# Patient Record
Sex: Male | Born: 1935 | Race: White | Hispanic: No | Marital: Married | Smoking: Former smoker
Health system: Southern US, Community
[De-identification: ages and names within clinical notes are randomized; demographics above are authoritative.]

## PROBLEM LIST (undated history)

## (undated) DIAGNOSIS — E7800 Pure hypercholesterolemia, unspecified: Secondary | ICD-10-CM

## (undated) DIAGNOSIS — S069X9A Unspecified intracranial injury with loss of consciousness of unspecified duration, initial encounter: Secondary | ICD-10-CM

## (undated) DIAGNOSIS — R220 Localized swelling, mass and lump, head: Secondary | ICD-10-CM

## (undated) DIAGNOSIS — R258 Other abnormal involuntary movements: Secondary | ICD-10-CM

## (undated) DIAGNOSIS — M1990 Unspecified osteoarthritis, unspecified site: Secondary | ICD-10-CM

## (undated) DIAGNOSIS — R531 Weakness: Secondary | ICD-10-CM

## (undated) DIAGNOSIS — G8190 Hemiplegia, unspecified affecting unspecified side: Secondary | ICD-10-CM

## (undated) DIAGNOSIS — Z8719 Personal history of other diseases of the digestive system: Secondary | ICD-10-CM

## (undated) DIAGNOSIS — F0391 Unspecified dementia with behavioral disturbance: Secondary | ICD-10-CM

## (undated) DIAGNOSIS — R42 Dizziness and giddiness: Secondary | ICD-10-CM

## (undated) DIAGNOSIS — G250 Essential tremor: Secondary | ICD-10-CM

## (undated) DIAGNOSIS — G8929 Other chronic pain: Secondary | ICD-10-CM

## (undated) DIAGNOSIS — K819 Cholecystitis, unspecified: Secondary | ICD-10-CM

## (undated) DIAGNOSIS — I639 Cerebral infarction, unspecified: Secondary | ICD-10-CM

## (undated) DIAGNOSIS — I10 Essential (primary) hypertension: Secondary | ICD-10-CM

## (undated) DIAGNOSIS — F0390 Unspecified dementia without behavioral disturbance: Secondary | ICD-10-CM

## (undated) HISTORY — DX: Weakness: R53.1

## (undated) HISTORY — DX: Pure hypercholesterolemia, unspecified: E78.00

## (undated) HISTORY — DX: Other chronic pain: G89.29

## (undated) HISTORY — PX: ESOPHAGOGASTRODUODENOSCOPY (EGD) WITH ESOPHAGEAL DILATION: SHX5812

## (undated) HISTORY — DX: Localized swelling, mass and lump, head: R22.0

## (undated) HISTORY — DX: Unspecified dementia, unspecified severity, without behavioral disturbance, psychotic disturbance, mood disturbance, and anxiety: F03.90

## (undated) HISTORY — PX: JOINT REPLACEMENT: SHX530

## (undated) HISTORY — DX: Unspecified dementia with behavioral disturbance: F03.91

## (undated) HISTORY — PX: BACK SURGERY: SHX140

## (undated) HISTORY — PX: TOTAL KNEE ARTHROPLASTY: SHX125

## (undated) HISTORY — DX: Unspecified intracranial injury with loss of consciousness of unspecified duration, initial encounter: S06.9X9A

---

## 1970-09-07 DIAGNOSIS — S069X9A Unspecified intracranial injury with loss of consciousness of unspecified duration, initial encounter: Secondary | ICD-10-CM

## 1970-09-07 DIAGNOSIS — S069XAA Unspecified intracranial injury with loss of consciousness status unknown, initial encounter: Secondary | ICD-10-CM

## 1970-09-07 DIAGNOSIS — G8190 Hemiplegia, unspecified affecting unspecified side: Secondary | ICD-10-CM

## 1970-09-07 HISTORY — DX: Hemiplegia, unspecified affecting unspecified side: G81.90

## 1970-09-07 HISTORY — DX: Unspecified intracranial injury with loss of consciousness status unknown, initial encounter: S06.9XAA

## 1970-09-07 HISTORY — DX: Unspecified intracranial injury with loss of consciousness of unspecified duration, initial encounter: S06.9X9A

## 1983-05-12 HISTORY — PX: INGUINAL HERNIA REPAIR: SUR1180

## 1994-05-11 HISTORY — PX: COLON RESECTION: SHX5231

## 1999-01-10 ENCOUNTER — Emergency Department (HOSPITAL_COMMUNITY): Admission: EM | Admit: 1999-01-10 | Discharge: 1999-01-10 | Payer: Self-pay | Admitting: *Deleted

## 1999-09-14 ENCOUNTER — Emergency Department (HOSPITAL_COMMUNITY): Admission: EM | Admit: 1999-09-14 | Discharge: 1999-09-14 | Payer: Self-pay | Admitting: Emergency Medicine

## 1999-09-14 ENCOUNTER — Encounter: Payer: Self-pay | Admitting: Emergency Medicine

## 1999-11-03 ENCOUNTER — Encounter: Admission: RE | Admit: 1999-11-03 | Discharge: 1999-11-25 | Payer: Self-pay | Admitting: Orthopedic Surgery

## 2000-09-29 ENCOUNTER — Ambulatory Visit (HOSPITAL_COMMUNITY): Admission: RE | Admit: 2000-09-29 | Discharge: 2000-09-29 | Payer: Self-pay | Admitting: Orthopedic Surgery

## 2000-09-29 ENCOUNTER — Encounter: Payer: Self-pay | Admitting: Orthopedic Surgery

## 2000-11-02 ENCOUNTER — Ambulatory Visit (HOSPITAL_COMMUNITY): Admission: RE | Admit: 2000-11-02 | Discharge: 2000-11-02 | Payer: Self-pay | Admitting: Gastroenterology

## 2003-10-16 ENCOUNTER — Inpatient Hospital Stay (HOSPITAL_COMMUNITY): Admission: RE | Admit: 2003-10-16 | Discharge: 2003-10-18 | Payer: Self-pay | Admitting: Orthopedic Surgery

## 2003-10-18 ENCOUNTER — Inpatient Hospital Stay
Admission: RE | Admit: 2003-10-18 | Discharge: 2003-10-29 | Payer: Self-pay | Admitting: Physical Medicine & Rehabilitation

## 2004-02-11 ENCOUNTER — Encounter (INDEPENDENT_AMBULATORY_CARE_PROVIDER_SITE_OTHER): Payer: Self-pay | Admitting: *Deleted

## 2004-02-11 ENCOUNTER — Ambulatory Visit (HOSPITAL_COMMUNITY): Admission: RE | Admit: 2004-02-11 | Discharge: 2004-02-11 | Payer: Self-pay | Admitting: Gastroenterology

## 2004-05-11 HISTORY — PX: POSTERIOR LAMINECTOMY / DECOMPRESSION LUMBAR SPINE: SUR740

## 2005-03-10 ENCOUNTER — Emergency Department (HOSPITAL_COMMUNITY): Admission: EM | Admit: 2005-03-10 | Discharge: 2005-03-10 | Payer: Self-pay | Admitting: Emergency Medicine

## 2005-03-12 ENCOUNTER — Encounter: Admission: RE | Admit: 2005-03-12 | Discharge: 2005-03-12 | Payer: Self-pay | Admitting: Orthopedic Surgery

## 2005-03-19 ENCOUNTER — Ambulatory Visit (HOSPITAL_COMMUNITY): Admission: RE | Admit: 2005-03-19 | Discharge: 2005-03-19 | Payer: Self-pay | Admitting: Neurology

## 2005-05-26 ENCOUNTER — Emergency Department (HOSPITAL_COMMUNITY): Admission: EM | Admit: 2005-05-26 | Discharge: 2005-05-26 | Payer: Self-pay | Admitting: Emergency Medicine

## 2005-05-29 ENCOUNTER — Ambulatory Visit: Payer: Self-pay

## 2005-05-30 ENCOUNTER — Inpatient Hospital Stay (HOSPITAL_COMMUNITY): Admission: EM | Admit: 2005-05-30 | Discharge: 2005-06-02 | Payer: Self-pay | Admitting: Emergency Medicine

## 2005-06-09 ENCOUNTER — Encounter: Admission: RE | Admit: 2005-06-09 | Discharge: 2005-06-09 | Payer: Self-pay | Admitting: Neurosurgery

## 2005-06-17 ENCOUNTER — Encounter: Admission: RE | Admit: 2005-06-17 | Discharge: 2005-06-17 | Payer: Self-pay | Admitting: Neurosurgery

## 2005-06-30 ENCOUNTER — Encounter: Admission: RE | Admit: 2005-06-30 | Discharge: 2005-06-30 | Payer: Self-pay | Admitting: Neurosurgery

## 2005-08-19 ENCOUNTER — Encounter: Admission: RE | Admit: 2005-08-19 | Discharge: 2005-08-19 | Payer: Self-pay | Admitting: Unknown Physician Specialty

## 2005-09-02 ENCOUNTER — Encounter: Admission: RE | Admit: 2005-09-02 | Discharge: 2005-09-02 | Payer: Self-pay | Admitting: Neurosurgery

## 2005-09-16 ENCOUNTER — Encounter: Admission: RE | Admit: 2005-09-16 | Discharge: 2005-09-16 | Payer: Self-pay | Admitting: Neurosurgery

## 2005-10-03 ENCOUNTER — Emergency Department (HOSPITAL_COMMUNITY): Admission: EM | Admit: 2005-10-03 | Discharge: 2005-10-03 | Payer: Self-pay | Admitting: Family Medicine

## 2005-10-03 ENCOUNTER — Ambulatory Visit (HOSPITAL_COMMUNITY): Admission: RE | Admit: 2005-10-03 | Discharge: 2005-10-03 | Payer: Self-pay | Admitting: Family Medicine

## 2006-04-19 ENCOUNTER — Ambulatory Visit: Payer: Self-pay | Admitting: Cardiology

## 2006-04-22 ENCOUNTER — Ambulatory Visit (HOSPITAL_COMMUNITY): Admission: RE | Admit: 2006-04-22 | Discharge: 2006-04-22 | Payer: Self-pay | Admitting: Neurosurgery

## 2006-04-23 ENCOUNTER — Emergency Department (HOSPITAL_COMMUNITY): Admission: EM | Admit: 2006-04-23 | Discharge: 2006-04-23 | Payer: Self-pay | Admitting: Emergency Medicine

## 2006-05-03 ENCOUNTER — Ambulatory Visit (HOSPITAL_COMMUNITY): Admission: RE | Admit: 2006-05-03 | Discharge: 2006-05-03 | Payer: Self-pay | Admitting: Orthopedic Surgery

## 2006-05-05 ENCOUNTER — Encounter: Admission: RE | Admit: 2006-05-05 | Discharge: 2006-05-05 | Payer: Self-pay | Admitting: Neurosurgery

## 2006-05-10 ENCOUNTER — Ambulatory Visit: Payer: Self-pay

## 2006-06-30 ENCOUNTER — Encounter: Admission: RE | Admit: 2006-06-30 | Discharge: 2006-06-30 | Payer: Self-pay | Admitting: Neurosurgery

## 2008-01-26 ENCOUNTER — Ambulatory Visit (HOSPITAL_COMMUNITY): Admission: RE | Admit: 2008-01-26 | Discharge: 2008-01-26 | Payer: Self-pay | Admitting: Neurology

## 2008-01-26 ENCOUNTER — Encounter (INDEPENDENT_AMBULATORY_CARE_PROVIDER_SITE_OTHER): Payer: Self-pay | Admitting: Neurology

## 2008-04-09 ENCOUNTER — Ambulatory Visit: Payer: Self-pay | Admitting: Cardiology

## 2008-04-09 DIAGNOSIS — R072 Precordial pain: Secondary | ICD-10-CM

## 2008-04-09 DIAGNOSIS — I10 Essential (primary) hypertension: Secondary | ICD-10-CM

## 2008-04-09 DIAGNOSIS — E7800 Pure hypercholesterolemia, unspecified: Secondary | ICD-10-CM

## 2008-04-11 ENCOUNTER — Ambulatory Visit: Payer: Self-pay

## 2008-05-25 ENCOUNTER — Ambulatory Visit: Payer: Self-pay | Admitting: Cardiology

## 2008-08-08 ENCOUNTER — Encounter: Admission: RE | Admit: 2008-08-08 | Discharge: 2008-08-22 | Payer: Self-pay | Admitting: Neurology

## 2008-08-17 ENCOUNTER — Ambulatory Visit (HOSPITAL_COMMUNITY): Admission: RE | Admit: 2008-08-17 | Discharge: 2008-08-17 | Payer: Self-pay | Admitting: Chiropractic Medicine

## 2009-03-07 ENCOUNTER — Emergency Department (HOSPITAL_COMMUNITY): Admission: EM | Admit: 2009-03-07 | Discharge: 2009-03-07 | Payer: Self-pay | Admitting: Emergency Medicine

## 2010-02-14 ENCOUNTER — Emergency Department (HOSPITAL_COMMUNITY)
Admission: EM | Admit: 2010-02-14 | Discharge: 2010-02-14 | Payer: Self-pay | Source: Home / Self Care | Admitting: Emergency Medicine

## 2010-02-14 ENCOUNTER — Inpatient Hospital Stay (HOSPITAL_COMMUNITY)
Admission: EM | Admit: 2010-02-14 | Discharge: 2010-02-17 | Payer: Self-pay | Source: Home / Self Care | Admitting: Emergency Medicine

## 2010-03-19 ENCOUNTER — Emergency Department (HOSPITAL_COMMUNITY): Admission: EM | Admit: 2010-03-19 | Discharge: 2010-03-19 | Payer: Self-pay | Admitting: Family Medicine

## 2010-04-10 ENCOUNTER — Emergency Department (HOSPITAL_COMMUNITY)
Admission: EM | Admit: 2010-04-10 | Discharge: 2010-04-11 | Payer: Self-pay | Source: Home / Self Care | Admitting: Emergency Medicine

## 2010-05-02 ENCOUNTER — Encounter
Admission: RE | Admit: 2010-05-02 | Discharge: 2010-05-02 | Payer: Self-pay | Source: Home / Self Care | Attending: Gastroenterology | Admitting: Gastroenterology

## 2010-06-01 ENCOUNTER — Encounter: Payer: Self-pay | Admitting: Unknown Physician Specialty

## 2010-06-01 ENCOUNTER — Encounter: Payer: Self-pay | Admitting: Gastroenterology

## 2010-06-26 ENCOUNTER — Other Ambulatory Visit (HOSPITAL_COMMUNITY): Payer: Self-pay | Admitting: Internal Medicine

## 2010-06-26 DIAGNOSIS — R42 Dizziness and giddiness: Secondary | ICD-10-CM

## 2010-06-26 DIAGNOSIS — R29818 Other symptoms and signs involving the nervous system: Secondary | ICD-10-CM

## 2010-06-26 DIAGNOSIS — R110 Nausea: Secondary | ICD-10-CM

## 2010-06-26 DIAGNOSIS — R1110 Vomiting, unspecified: Secondary | ICD-10-CM

## 2010-06-29 ENCOUNTER — Ambulatory Visit (HOSPITAL_COMMUNITY)
Admission: RE | Admit: 2010-06-29 | Discharge: 2010-06-29 | Disposition: A | Discharge status: Home / Self Care | Payer: Medicare Other | Source: Ambulatory Visit | Attending: Internal Medicine | Admitting: Internal Medicine

## 2010-06-29 DIAGNOSIS — I998 Other disorder of circulatory system: Secondary | ICD-10-CM | POA: Insufficient documentation

## 2010-06-29 DIAGNOSIS — R110 Nausea: Secondary | ICD-10-CM

## 2010-06-29 DIAGNOSIS — I6509 Occlusion and stenosis of unspecified vertebral artery: Secondary | ICD-10-CM | POA: Insufficient documentation

## 2010-06-29 DIAGNOSIS — G319 Degenerative disease of nervous system, unspecified: Secondary | ICD-10-CM | POA: Insufficient documentation

## 2010-06-29 DIAGNOSIS — R1110 Vomiting, unspecified: Secondary | ICD-10-CM

## 2010-06-29 DIAGNOSIS — J329 Chronic sinusitis, unspecified: Secondary | ICD-10-CM | POA: Insufficient documentation

## 2010-06-29 DIAGNOSIS — R112 Nausea with vomiting, unspecified: Secondary | ICD-10-CM | POA: Insufficient documentation

## 2010-06-29 DIAGNOSIS — R42 Dizziness and giddiness: Secondary | ICD-10-CM

## 2010-06-29 DIAGNOSIS — R29818 Other symptoms and signs involving the nervous system: Secondary | ICD-10-CM

## 2010-06-29 DIAGNOSIS — I6990 Unspecified sequelae of unspecified cerebrovascular disease: Secondary | ICD-10-CM | POA: Insufficient documentation

## 2010-06-29 DIAGNOSIS — Z8673 Personal history of transient ischemic attack (TIA), and cerebral infarction without residual deficits: Secondary | ICD-10-CM | POA: Insufficient documentation

## 2010-06-29 MED ORDER — GADOBENATE DIMEGLUMINE 529 MG/ML IV SOLN: 16.0000 mL | Freq: Once | INTRAVENOUS | Status: AC | PRN

## 2010-06-29 MED ORDER — GADOBENATE DIMEGLUMINE 529 MG/ML IV SOLN
16.0000 mL | Freq: Once | INTRAVENOUS | Status: AC | PRN
  Administered 2010-06-29: 16 mL via INTRAVENOUS

## 2010-07-18 ENCOUNTER — Ambulatory Visit: Payer: Medicare Other | Attending: Neurology | Admitting: Physical Therapy

## 2010-07-18 DIAGNOSIS — IMO0001 Reserved for inherently not codable concepts without codable children: Secondary | ICD-10-CM | POA: Insufficient documentation

## 2010-07-18 DIAGNOSIS — R42 Dizziness and giddiness: Secondary | ICD-10-CM | POA: Insufficient documentation

## 2010-07-21 LAB — HEPATIC FUNCTION PANEL
Bilirubin, Direct: 0.2 mg/dL (ref 0.0–0.3)
Indirect Bilirubin: 0.5 mg/dL (ref 0.3–0.9)
Total Bilirubin: 0.7 mg/dL (ref 0.3–1.2)

## 2010-07-21 LAB — DIFFERENTIAL
Eosinophils Absolute: 0.2 10*3/uL (ref 0.0–0.7)
Eosinophils Relative: 2 % (ref 0–5)
Lymphocytes Relative: 12 % (ref 12–46)
Lymphs Abs: 1.2 10*3/uL (ref 0.7–4.0)
Monocytes Relative: 9 % (ref 3–12)

## 2010-07-21 LAB — CBC
MCH: 28.1 pg (ref 26.0–34.0)
Platelets: 318 10*3/uL (ref 150–400)
RDW: 15.8 % — ABNORMAL HIGH (ref 11.5–15.5)
WBC: 9.8 10*3/uL (ref 4.0–10.5)

## 2010-07-21 LAB — BASIC METABOLIC PANEL
Calcium: 9 mg/dL (ref 8.4–10.5)
Creatinine, Ser: 0.75 mg/dL (ref 0.4–1.5)
GFR calc non Af Amer: >60 mL/min (ref >60)
Glucose, Bld: 134 mg/dL — ABNORMAL HIGH (ref 70–99)
Potassium: 4.2 mEq/L (ref 3.5–5.1)

## 2010-07-21 LAB — URINALYSIS, ROUTINE W REFLEX MICROSCOPIC
Bilirubin Urine: NEGATIVE
Nitrite: NEGATIVE
Urobilinogen, UA: 0.2 mg/dL (ref 0.0–1.0)

## 2010-07-21 LAB — LIPASE, BLOOD: Lipase: 169 U/L — ABNORMAL HIGH (ref 11–59)

## 2010-07-22 ENCOUNTER — Ambulatory Visit: Payer: Medicare Other | Admitting: Physical Therapy

## 2010-07-23 LAB — CBC
HCT: 37.7 % — ABNORMAL LOW (ref 39.0–52.0)
HCT: 39 % (ref 39.0–52.0)
HCT: 44.1 % (ref 39.0–52.0)
HCT: 44.6 % (ref 39.0–52.0)
Hemoglobin: 12.2 g/dL — ABNORMAL LOW (ref 13.0–17.0)
Hemoglobin: 13.2 g/dL (ref 13.0–17.0)
Hemoglobin: 13.5 g/dL (ref 13.0–17.0)
Hemoglobin: 15.2 g/dL (ref 13.0–17.0)
MCH: 26 pg (ref 26.0–34.0)
MCH: 26.7 pg (ref 26.0–34.0)
MCHC: 33.5 g/dL (ref 30.0–36.0)
MCHC: 34 g/dL (ref 30.0–36.0)
MCHC: 34.6 g/dL (ref 30.0–36.0)
MCV: 74.8 fL — ABNORMAL LOW (ref 78.0–100.0)
MCV: 75.1 fL — ABNORMAL LOW (ref 78.0–100.0)
MCV: 77.8 fL — ABNORMAL LOW (ref 78.0–100.0)
MCV: 78.2 fL (ref 78.0–100.0)
Platelets: 261 10*3/uL (ref 150–400)
Platelets: 281 10*3/uL (ref 150–400)
Platelets: 287 10*3/uL (ref 150–400)
Platelets: 337 10*3/uL (ref 150–400)
RBC: 4.58 MIL/uL (ref 4.22–5.81)
RBC: 5.04 MIL/uL (ref 4.22–5.81)
RBC: 5.19 MIL/uL (ref 4.22–5.81)
RBC: 5.7 MIL/uL (ref 4.22–5.81)
RDW: 15.2 % (ref 11.5–15.5)
RDW: 15.8 % — ABNORMAL HIGH (ref 11.5–15.5)
WBC: 12.1 10*3/uL — ABNORMAL HIGH (ref 4.0–10.5)
WBC: 12.3 10*3/uL — ABNORMAL HIGH (ref 4.0–10.5)

## 2010-07-23 LAB — IRON AND TIBC
Iron: 47 ug/dL (ref 42–135)
Saturation Ratios: 13 % — ABNORMAL LOW (ref 20–55)
UIBC: 302 ug/dL

## 2010-07-23 LAB — COMPREHENSIVE METABOLIC PANEL
ALT: 19 U/L (ref 0–53)
AST: 22 U/L (ref 0–37)
AST: 32 U/L (ref 0–37)
Albumin: 3.2 g/dL — ABNORMAL LOW (ref 3.5–5.2)
Albumin: 3.3 g/dL — ABNORMAL LOW (ref 3.5–5.2)
Albumin: 3.5 g/dL (ref 3.5–5.2)
Alkaline Phosphatase: 45 U/L (ref 39–117)
Alkaline Phosphatase: 49 U/L (ref 39–117)
Alkaline Phosphatase: 54 U/L (ref 39–117)
BUN: 10 mg/dL (ref 6–23)
BUN: 15 mg/dL (ref 6–23)
BUN: 6 mg/dL (ref 6–23)
CO2: 21 mEq/L (ref 19–32)
CO2: 21 mEq/L (ref 19–32)
CO2: 25 mEq/L (ref 19–32)
CO2: 26 mEq/L (ref 19–32)
Calcium: 8.7 mg/dL (ref 8.4–10.5)
Calcium: 9 mg/dL (ref 8.4–10.5)
Chloride: 102 mEq/L (ref 96–112)
Chloride: 85 mEq/L — ABNORMAL LOW (ref 96–112)
Chloride: 89 mEq/L — ABNORMAL LOW (ref 96–112)
Creatinine, Ser: 0.65 mg/dL (ref 0.4–1.5)
Creatinine, Ser: 0.74 mg/dL (ref 0.4–1.5)
GFR calc Af Amer: >60 mL/min (ref >60)
GFR calc Af Amer: >60 mL/min (ref >60)
GFR calc non Af Amer: >60 mL/min (ref >60)
GFR calc non Af Amer: >60 mL/min (ref >60)
Glucose, Bld: 107 mg/dL — ABNORMAL HIGH (ref 70–99)
Glucose, Bld: 132 mg/dL — ABNORMAL HIGH (ref 70–99)
Glucose, Bld: 150 mg/dL — ABNORMAL HIGH (ref 70–99)
Glucose, Bld: 92 mg/dL (ref 70–99)
Potassium: 4 mEq/L (ref 3.5–5.1)
Potassium: 4 mEq/L (ref 3.5–5.1)
Potassium: 4.6 mEq/L (ref 3.5–5.1)
Sodium: 125 mEq/L — ABNORMAL LOW (ref 135–145)
Sodium: 134 mEq/L — ABNORMAL LOW (ref 135–145)
Total Bilirubin: 0.7 mg/dL (ref 0.3–1.2)
Total Bilirubin: 1.1 mg/dL (ref 0.3–1.2)
Total Bilirubin: 1.1 mg/dL (ref 0.3–1.2)
Total Protein: 5.7 g/dL — ABNORMAL LOW (ref 6.0–8.3)
Total Protein: 5.9 g/dL — ABNORMAL LOW (ref 6.0–8.3)

## 2010-07-23 LAB — DIFFERENTIAL
Basophils Absolute: 0 10*3/uL (ref 0.0–0.1)
Basophils Absolute: 0 10*3/uL (ref 0.0–0.1)
Basophils Relative: 0 % (ref 0–1)
Basophils Relative: 0 % (ref 0–1)
Basophils Relative: 0 % (ref 0–1)
Eosinophils Absolute: 0 10*3/uL (ref 0.0–0.7)
Eosinophils Absolute: 0 10*3/uL (ref 0.0–0.7)
Eosinophils Absolute: 0 10*3/uL (ref 0.0–0.7)
Eosinophils Relative: 0 % (ref 0–5)
Eosinophils Relative: 0 % (ref 0–5)
Lymphocytes Relative: 6 % — ABNORMAL LOW (ref 12–46)
Lymphocytes Relative: 9 % — ABNORMAL LOW (ref 12–46)
Lymphs Abs: 0.7 10*3/uL (ref 0.7–4.0)
Lymphs Abs: 1.1 10*3/uL (ref 0.7–4.0)
Lymphs Abs: 1.1 10*3/uL (ref 0.7–4.0)
Monocytes Absolute: 0.4 10*3/uL (ref 0.1–1.0)
Monocytes Absolute: 1.2 10*3/uL — ABNORMAL HIGH (ref 0.1–1.0)
Monocytes Absolute: 1.7 10*3/uL — ABNORMAL HIGH (ref 0.1–1.0)
Monocytes Relative: 4 % (ref 3–12)
Monocytes Relative: 9 % (ref 3–12)
Neutro Abs: 10.9 10*3/uL — ABNORMAL HIGH (ref 1.7–7.7)
Neutro Abs: 9.5 10*3/uL — ABNORMAL HIGH (ref 1.7–7.7)
Neutro Abs: 9.9 10*3/uL — ABNORMAL HIGH (ref 1.7–7.7)
Neutrophils Relative %: 82 % — ABNORMAL HIGH (ref 43–77)
Neutrophils Relative %: 83 % — ABNORMAL HIGH (ref 43–77)
Neutrophils Relative %: 86 % — ABNORMAL HIGH (ref 43–77)
Neutrophils Relative %: 89 % — ABNORMAL HIGH (ref 43–77)

## 2010-07-23 LAB — URINE CULTURE
Colony Count: >=100000
Culture  Setup Time: 201110072134

## 2010-07-23 LAB — POCT I-STAT, CHEM 8
BUN: 7 mg/dL (ref 6–23)
Calcium, Ion: 0.87 mmol/L — ABNORMAL LOW (ref 1.12–1.32)
Creatinine, Ser: 0.6 mg/dL (ref 0.4–1.5)
Hemoglobin: 15 g/dL (ref 13.0–17.0)
TCO2: 22 mmol/L (ref 0–100)

## 2010-07-23 LAB — GLUCOSE, CAPILLARY: Glucose-Capillary: 98 mg/dL (ref 70–99)

## 2010-07-23 LAB — URINALYSIS, ROUTINE W REFLEX MICROSCOPIC
Bilirubin Urine: NEGATIVE
Bilirubin Urine: NEGATIVE
Glucose, UA: 500 mg/dL — AB
Glucose, UA: >1000 mg/dL — AB
Hgb urine dipstick: NEGATIVE
Ketones, ur: 15 mg/dL — AB
Protein, ur: NEGATIVE mg/dL
Protein, ur: NEGATIVE mg/dL
pH: 7 (ref 5.0–8.0)
pH: 7.5 (ref 5.0–8.0)

## 2010-07-23 LAB — FERRITIN: Ferritin: 18 ng/mL — ABNORMAL LOW (ref 22–322)

## 2010-07-23 LAB — CALCIUM, IONIZED: Calcium, Ion: 1.23 mmol/L (ref 1.12–1.32)

## 2010-07-23 LAB — TSH: TSH: 0.557 u[IU]/mL (ref 0.350–4.500)

## 2010-07-23 LAB — URINE MICROSCOPIC-ADD ON

## 2010-07-23 LAB — LACTIC ACID, PLASMA: Lactic Acid, Venous: 2.4 mmol/L — ABNORMAL HIGH (ref 0.5–2.2)

## 2010-07-23 LAB — HEMOGLOBIN A1C: Mean Plasma Glucose: 111 mg/dL (ref <117)

## 2010-07-23 LAB — VITAMIN B12: Vitamin B-12: 832 pg/mL (ref 211–911)

## 2010-07-23 LAB — RETICULOCYTES: RBC.: 5.34 MIL/uL (ref 4.22–5.81)

## 2010-07-23 LAB — LIPASE, BLOOD: Lipase: 40 U/L (ref 11–59)

## 2010-07-23 LAB — HEMOCCULT GUIAC POC 1CARD (OFFICE): Fecal Occult Bld: POSITIVE

## 2010-07-25 ENCOUNTER — Ambulatory Visit: Payer: Medicare Other | Admitting: Rehabilitative and Restorative Service Providers"

## 2010-07-29 ENCOUNTER — Ambulatory Visit: Payer: Medicare Other | Admitting: Physical Therapy

## 2010-08-01 ENCOUNTER — Ambulatory Visit: Payer: Medicare Other | Admitting: Physical Therapy

## 2010-08-05 ENCOUNTER — Ambulatory Visit: Payer: Medicare Other | Admitting: Physical Therapy

## 2010-08-07 ENCOUNTER — Encounter: Payer: Medicare Other | Admitting: Physical Therapy

## 2010-08-08 ENCOUNTER — Ambulatory Visit: Payer: Medicare Other | Admitting: Physical Therapy

## 2010-08-12 ENCOUNTER — Ambulatory Visit: Payer: Medicare Other | Attending: Neurology | Admitting: Physical Therapy

## 2010-08-12 DIAGNOSIS — IMO0001 Reserved for inherently not codable concepts without codable children: Secondary | ICD-10-CM | POA: Insufficient documentation

## 2010-08-12 DIAGNOSIS — R42 Dizziness and giddiness: Secondary | ICD-10-CM | POA: Insufficient documentation

## 2010-08-14 ENCOUNTER — Encounter: Payer: Medicare Other | Admitting: Physical Therapy

## 2010-08-18 ENCOUNTER — Ambulatory Visit: Payer: Medicare Other | Admitting: Physical Therapy

## 2010-08-26 ENCOUNTER — Ambulatory Visit: Payer: Medicare Other | Admitting: Physical Therapy

## 2010-09-01 ENCOUNTER — Ambulatory Visit: Payer: Medicare Other | Admitting: Physical Therapy

## 2010-09-08 ENCOUNTER — Ambulatory Visit: Payer: Medicare Other | Admitting: Physical Therapy

## 2010-09-15 ENCOUNTER — Ambulatory Visit: Payer: Medicare Other | Attending: Neurology | Admitting: Physical Therapy

## 2010-09-15 DIAGNOSIS — R42 Dizziness and giddiness: Secondary | ICD-10-CM | POA: Insufficient documentation

## 2010-09-15 DIAGNOSIS — IMO0001 Reserved for inherently not codable concepts without codable children: Secondary | ICD-10-CM | POA: Insufficient documentation

## 2010-09-22 ENCOUNTER — Ambulatory Visit: Payer: Medicare Other | Admitting: Physical Therapy

## 2010-09-23 NOTE — Assessment & Plan Note (Signed)
[facility]                            CARDIOLOGY OFFICE NOTE   NAME:[name], [name] ORMAN                         MRN:          [id number]  DATE:05/25/2008                            DOB:          01-13-1936    [name] is a pleasant 75 year old gentleman who I recently saw on  April 09, 2008.  He had had an episode of chest pain, while  exercising.  He was concerned about the symptoms and asked for further  evaluation.  We did schedule him to have a Myoview on April 11, 2008,  that showed an ejection fraction of 62%.  There is a slight decreased  counts in the inferior wall, but there was no scar or ischemia.  Since  then, he has done well with no dyspnea, chest pain, palpitations, or  syncope.  There is no pedal edema.   MEDICATIONS:  1. Benazepril 10 mg p.o. daily.  2. Pravachol 40 mg p.o. daily.  3. Clonazepam.   PHYSICAL EXAMINATION:  VITAL SIGNS:  Today, shows a blood pressure of  132/79.  His pulse is 57.  He weighs 182 pounds.  HEENT:  Normal.  NECK:  Supple.  CHEST:  Clear.  CARDIOVASCULAR:  Regular rate and rhythm.  ABDOMEN:  No tenderness.  EXTREMITIES:  No edema.   Electrocardiogram shows sinus bradycardia at a rate of 57.  The axis is  normal.  There are no ST changes noted.   DIAGNOSES:  1. Recent atypical chest pain - [name] symptoms have resolved.      His Myoview shows no ischemia.  We will not pursue further cardiac      workup at this point.  2. Hyperlipidemia - He will continue on his statin and this is being      managed by Dr. [name].  3. History of right-sided paralysis following an accident on a horse.   We will see him back on an as-needed basis.     Madolyn Frieze [name], MD, [facility]  Electronically Signed    BSC/MedQ  DD: 05/25/2008  DT: 05/25/2008  Job #: [id number]   cc:   Gaspar Garbe, M.D.

## 2010-09-23 NOTE — Assessment & Plan Note (Signed)
[facility]                            CARDIOLOGY OFFICE NOTE   NAME:Chicoine, ASUNCION SHIBATA                         MRN:          [id number]  DATE:04/09/2008                            DOB:          02-14-1936    Mr. Britten is a pleasant gentleman who is 75 years old.  I saw him on  April 19, 2006, secondary to atypical chest pain.  At that time, he  had a stress Myoview on May 10, 2006.  Ejection fraction was 64%.  It was interpreted by Dr. Dietrich Pates as a Baiz area of minor inferior  ischemia cannot be excluded, but felt more likely to be normal.  I did  review the images and felt that they were normal.  He has done  reasonably well since then.  However, since January, he has had  occasional chest pain.  It is in the left breast area and described as  pinpricks that lasts for several seconds and resolves spontaneously.  One week ago, however, he had a pressure sensation in his chest while  using a recumbent bicycle.  He stopped his exercise and the pain  immediately resolved.  The total duration was approximately 10 seconds.  It did not radiate.  It is not positional or pleuritic nor is it related  to food.  There is no associated nausea, vomiting, shortness of breath,  or diaphoresis.  He has had no pain since then.  Because of the above,  he presents for further evaluation.   MEDICATIONS:  1. Benazepril 10 mg p.o. daily.  2. Pravachol 40 mg p.o. daily.  3. Clonazepam 1 mg p.o. daily.   PHYSICAL EXAMINATION:  VITAL SIGNS:  Today, shows a blood pressure of  147/82 and his pulse is 64.  Weight is 182 pounds.  HEENT:  Normal.  NECK:  Supple with no bruits.  CHEST:  Clear.  CARDIOVASCULAR:  Regular rhythm.  ABDOMEN:  No tenderness.  EXTREMITIES:  No edema.  He does have right-sided paralysis from  previous horse accident.   His electrocardiogram showed a sinus rhythm at a rate of 63.  There were  no ST changes noted.   DIAGNOSES:  1. Atypical  chest pain - The patient's symptoms are atypical.  We will      plan to proceed with adenosine Myoview for risk stratification.  If      it is normal, then I will allow him to proceed with exercising.  If      he has recurrent symptoms, then he may require cardiac      catheterization in the future.  2. Hyperlipidemia - He is on a statin at present, and his primary care      physician is managing this.  3. History of right-sided paralysis.   I will see him back in 6 weeks.     Madolyn Frieze [name], MD, [facility]  Electronically Signed    BSC/MedQ  DD: 04/09/2008  DT: 04/09/2008  Job #: [id number]   cc:   Gaspar Garbe, M.D.

## 2010-09-26 NOTE — Discharge Summary (Signed)
NAMEHAYDIN, [name] NO.:  [id number]   MEDICAL RECORD NO.:  [id number]          PATIENT TYPE:  INP   LOCATION:  4707                         FACILITY:  [facility]   PHYSICIAN:  Gaspar Garbe, M.D.DATE OF BIRTH:  08-27-1935   DATE OF ADMISSION:  05/30/2005  DATE OF DISCHARGE:  06/02/2005                                 DISCHARGE SUMMARY   DIAGNOSES:  1.  Presyncope, presumed anxiety driven.  2.  Status post fall off horse 20 years ago with right-sided paralysis.  3.  Severe degenerative joint disease of spine, status post recent surgery.  4.  Hypertension.  5.  Cerebrovascular disease.  6.  Hypertension.  7.  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   LABORATORY ON DATE OF DISCHARGE:  Sodium slightly decreased at 133,  potassium 4.0, BUN and creatinine 7/0.2 respectively, glucose normal at 101.  White count 9.1, hemoglobin 14.6, hematocrit 42.1, platelet count 224,000.  TSH 1.472. Cortisol 5.7. Glycosylated hemoglobin 4.5.   MR of the brain shows a remote encephalomalacic changes, left thalamus and  posterior limb of internal capsule, putamen, and extends to the tail of the  caudate. Remote encephalomalacia of the right frontal lobe consistent with  prior injury. No acute intracranial abnormality was noted. Generalized  atrophy noted. MRA showed atherosclerotic change, supraclinoid internal  carotid arteries bilaterally left greater than right, focal stenosis  proximal M2 branches bilaterally right greater than left, and tapered severe  stenosis in the distal A1 segment, Guile P1 segments bilaterally with PCA  filling primarily from posterior communicating arteries appearing to be  stenotic. 50% narrowing of the proximal left common carotid with the origins  of brachiocephalic and left subclavian within normal limits, and codominant  vertebral arteries with mild narrowing noted. Right common carotid was  within normal limits. CAT scan of the abdomen  and pelvis revealed no masses  or lesions noted, nonacute. CEA 0.9. C-peptide elevated at 10.3. Insulin  level elevated at 131. Cardiac markers negative. CT of head not acute from  May 30, 2005.   MEDICATIONS ON DISCHARGE:  1.  Aggrenox one tablet b.i.d.  2.  Uroxatral 10 mg p.o. q.h.s.  3.  Klonopin 1 mg p.o. daily; may repeat for anxiety episode.  4.  Zoloft 50 mg p.o. daily.  5.  Lioresal 5 mg one-half tablet q.i.d.  6.  Benazepril 10 mg p.o. daily.  7.  Quinine 325 mg p.o. b.i.d.  8.  Tylenol p.r.n.   SUMMARY OF HOSPITAL COURSE:  The patient was admitted on May 30, 2005,  by my partner, Dr. Timothy Lasso, as the patient has been having several episodes of  weakness numbering 18 to 20 in the prior day. Of note, the patient had been  seen by Dr. Porfirio Mylar Dohmeier earlier in the week and indicated with her  workup that the symptoms were not neurologic. All this stems following a  laminectomy, which was done by a neurosurgeon in [facility]. The patient has  had urinary retention and complications related to that and has been having  episodic weakness and numbness in his  left hand.  This follows falling out  spells, which his wife who is diabetic has been treating by giving him  excess sugar which seems to help some times, but does not help at other  times. The patient was put on a Holter monitor per Dr. Vickey Huger after  discussion, but no acuity was noted. Given that the patient was having  escalation of symptoms he was subsequently taken to the emergency room and  admitted for workup. In addition to the imaging and laboratory studies, the  patient underwent fasting testing of his blood sugars and no hypoglycemia  was ever noted, even with episodes that had decreased to one time a day.  A  long discussion was undertaken through the patient as well as his wife prior  to discharge indicating that his episodes were most likely anxiety and maybe  driven partially by his expectation of quick  recovery from surgery which is  not realistic. The wife agreed with this and was interested particularly in  insulinoma which had been ruled out by the aforementioned workup. By just  being in the hospital, under observation, his episodes were short lived and  down to a period of a one once a day with the last being greater than 24  hours prior to his discharge. The patient also underwent EEG testing which  was normal as well, not consistent with any seizures. Given the above MRI  results, the patient was started on Aggrenox and will be followed as an  outpatient routine for cholesterol management. Blood pressure was well  controlled during the time of his hospitalization and the patient has been  given permission to continue his physical therapy and occupational therapy  without any other difficulties. The patient was discharged in the care of  his wife to home on the afternoon of June 02, 2005, in good condition.   PHYSICAL EXAMINATION:  VITAL SIGNS: On day of discharge, vital signs  revealed blood pressure of 136/78, heart rate 74, respiratory rate 20,  saturation 97% on room air. Temperature 97.2.  GENERAL: In no acute distress.  HEENT: Normocephalic and atraumatic. PERRLA. EOMI. ENT within normal limits.  NECK: Supple with no lymphadenopathy, JVD, or bruits appreciated.  HEART: Regular rate and rhythm with no murmurs, rubs, or gallops.  LUNGS: Clear to auscultation bilaterally.  ABDOMEN: Soft, nontender with normoactive bowel sounds. No  hepatosplenomegaly appreciated.  EXTREMITIES: The patient has slight edema of his right side which is  consistent with old stroke and neurologic release. The left side of his body  shows normal strength and there is no weakness noted or any numbness  present.  NEUROLOGIC: As above, completely intact physically on the left side and weak  on the right consistent with prior right injury as noted above.  DISCHARGE INSTRUCTIONS:  The patient is to  take his Zoloft and Aggrenox,  which are new medications for him. He is to use if Klonopin if he develops  any of these episodes. The patient does not require excess sugar intake and  this was mentioned to his wife as well. If his episodes escalate further we  may need to increase his Zoloft, however I feel he will have a good response  to this. He may resume physical therapy and occupational therapy as before,  and does not have any other limitations. The patient is to follow up with  Dr. [name] per his prior appointments. He will have follow up lipid testing  to consider use of statins given  his atherosclerotic brain disease as noted  on his MRI.      Gaspar Garbe, M.D.  Electronically Signed     RWT/MEDQ  D:  06/02/2005  T:  06/02/2005  Job:  [id number]

## 2010-09-26 NOTE — Discharge Summary (Signed)
[name], [name] NO.:  [id number]   MEDICAL RECORD NO.:  [id number]                   PATIENT TYPE:  ORB   LOCATION:  4528                                 FACILITY:  [facility]   PHYSICIAN:  [name], M.D.           DATE OF BIRTH:  1935-10-06   DATE OF ADMISSION:  10/18/2003  DATE OF DISCHARGE:  10/29/2003                                 DISCHARGE SUMMARY   DISCHARGE DIAGNOSES:  1. Right total knee arthroplasty secondary to osteoarthritis.  2. History of focal spinal cord injury.  3. Clonus secondary to number two.  4. Deep vein thrombosis prophylaxis.  5. Anemia.   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  He underwent right total knee arthroplasty, on  October 16, 2003, by Dr. Charlann [name].  Started on Coumadin for DVT prophylaxis.   PHYSICAL THERAPY REPORT:  At this time, patient weightbearing as tolerated,  mod assist bed mobility, and total assist for transfers.   The patient was transferred to [facility] on October 18, 2003.   PAST MEDICAL HISTORY:  1. Hiatal hernia.  2. Right hemiplegia secondary to a horse fell on patient and he had a spinal     cord injury.   PAST SURGICAL HISTORY:  Hernia repair.   PRIMARY CARE [name]:  [name], M.D.   MEDICATIONS:  Prior to admission,  1. Robaxin 500 mg q.i.d. p.r.n.  2. Quinine sulfate 325 p.o. b.i.d.  3. Aleve p.r.n.  4. Tylenol p.r.n.   ALLERGIES:  PENICILLIN.   The patient is married, lives in a [place] house, three-steps to entry, no  tobacco or alcohol use.  Wife will be the caretaker.  He is disabled for 33  years.  He was walking with a cane prior to admission.   REVIEW OF SYSTEMS:  Denies any chest pain, shortness of breath, no nausea,  vomiting.   FAMILY HISTORY:  Noncontributory.   HOSPITAL COURSE:  [name] was admitted to [facility]  Department, on October 18, 2003, for rehab therapy where he received more than  an hour of therapy on the [facility].  Overall, [name] progressed  very well.  He did have a problem while in rehab with clonus.  Surgical  incision healed very well demonstrated no signs of infection.  He remained  on Coumadin as well as Lovenox, till Coumadin was therapeutic.  The patient  also remained on Trinsicon one tab b.i.d. for anemia.  Latest hemoglobin  performed, on October 19, 2003, was 10.3, hematocrit 29.9.  Latest INR is 2.4.  The patient initially was placed on quinine sulfate and Robaxin for clonus  or spasm.  This was discontinued, and he was started on Baclofen.  Baclofen  was initially  spaced on 5 mg b.i.d.  It was elevated to q.i.d.  The patient  received quinine sulfate every 12 hours as needed for spasm.  Spasm did  improve significantly with the Baclofen.  There were no other major issues  that occurred while the patient was in rehab.  The patient was able to  perform excellent range of motion, greater than 90 degrees in his right  knee.  He is to followup with Dr. Charlann [name] at time of discharge.   LATEST LABS:  His latest INR is 2.4.  Hemoglobin 10.3, hematocrit 29.9,  white blood cell count 6.4, platelet count 246.  Latest sodium 134,  potassium 3.6, chloride 102, CO2 of 26, glucose 98, BUN 6, creatinine 1.0.  AST 18, ALT 17.  Urine culture performed, on October 19, 2003, no growth x 1  day.   At the time of discharge all vitals were stable.   PHYSICAL THERAPY REPORT:  Indicated the patient was able to ambulate  approximately 150 feet with a rolling walker, supervision level; able to do  most transfers at supervision level and modified independent, bed mobility  modified independent.  Most ADLs supervision level.   He was discharged home with his wife.  At the time of discharge all vitals  were stable.  Surgical incision demonstrated no signs of  infection, no  erythema noted.   DISCHARGE MEDICATIONS:  1. Coumadin 5 mg till November 15, 2003.  2. Trinsicon one tablet twice daily.  3. Baclofen 5 mg four times daily, 8 a.m., 12 p.m., 4 p.m., and 8 p.m.  4. Vicodin 1-2 tabs every six to eight hours as needed for pain.  5. Do not take Robaxin or Klonopin right now.  6. No aspirin, ibuprofen, Aleve while on Coumadin.  7. Calcium daily.  8. Quinine sulfate every 12 hours as needed.   PAIN MANAGEMENT:  Vicodin.   No driving, drinking alcohol, no smoking.  Use platform walker.  He has  [facility] for PT and OT and a nurse.  Nurse will draw  Coumadin on Wednesday, November 03, 2003, and monitor.   Follow up with Dr. Charlann [name] in two weeks.  Follow up with Dr. Wylene [name] in four weeks.  Follow up with Dr. Wynn [name] for rehab or __________  as needed or patient  can return to Dr. Avie [name].      [name], P.A.                         [name], M.D.    LB/MEDQ  D:  10/29/2003  T:  10/31/2003  Job:  [id number]   cc:   [name], M.D.  [address] [place]  [place] [address]  Fax: [phone]   Genene Churn. Love, M.D.  [address]  [address]  [place]  [place] [address]  Fax: [phone]   Madlyn Frankel. [name], M.D.  [facility]  [address]  [address]  [place]  [place] [address]  Fax: [phone]   [name], M.D.  [address]  [place]  [place] [address]  Fax: [phone]

## 2010-09-26 NOTE — Op Note (Signed)
NAMEVUONG, [name] NO.:  [id number]   MEDICAL RECORD NO.:  [id number]          PATIENT TYPE:  AMB   LOCATION:  ENDO                         FACILITY:  [facility]   PHYSICIAN:  Latron C. Madilyn Fireman, M.D.    DATE OF BIRTH:  07-25-1935   DATE OF PROCEDURE:  02/11/2004  DATE OF DISCHARGE:                                 OPERATIVE REPORT   PROCEDURE:  Colonoscopy with polypectomy.   INDICATIONS FOR PROCEDURE:  History of colon cancer status post resection in  1996, last colonoscopy three years ago.   DESCRIPTION OF PROCEDURE:  The patient was placed in the left lateral  decubitus position then placed on the pulse monitor with continuous low flow  oxygen delivered by nasal cannula. He was sedated with 75 mcg IV fentanyl  and 8 mg IV Versed. The Olympus video colonoscope was inserted into the  rectum and advanced to the cecum, confirmed by transillumination at  McBurney's point and visualization of the ileocecal valve and appendiceal  orifice. The prep was excellent. The cecum appeared normal with no masses,  polyps, diverticula or other mucosal abnormalities. Within the ascending  colon, there was a sessile polyp which was removed by snare approximately 1  cm in diameter.  The transverse colon appeared normal. Within the descending  colon, there was an 8 mm polyp that was fulgurated by hot biopsy.  About 20  cm of surgical anastomosis was apparent and there was a Dykema polyp versus  granulation tissue no more than 6-8 mm in diameter that was fulgurated by  hot biopsy. The remainder of the rectum appeared normal. The scope was then  withdrawn and the patient returned to the recovery room in stable condition.  He tolerated the procedure well and there were no immediate complications.   IMPRESSION:  Three Yehle colon polyps.   PLAN:  Await histology to determine interval for next colonoscopy.      JCH/MEDQ  D:  02/11/2004  T:  02/11/2004  Job:  [id number]   cc:   Gaspar Garbe, M.D.  [address]  [place]  [place] [address]  Fax: [phone]

## 2010-09-26 NOTE — Assessment & Plan Note (Signed)
[facility]                            CARDIOLOGY OFFICE NOTE   NAME:[name], [name] WALDRIDGE                         MRN:          [id number]  DATE:04/19/2006                            DOB:          January 14, 1936    Mr.  [name] is a very pleasant 75 year old male with past medical history  of right-sided paralysis due to a horse accident.  We are asked to  evaluate for chest pain.  The patient has no prior cardiac history.  He  typically does not have significant dyspnea on exertion, orthopnea, PND,  pedal edema, palpitations, presyncope, syncope, or chest pain.  However,  in the past several weeks, he has noticed an occasional ache in his left  chest area.  The pain does not radiate nor is it pleuritic or  positional.  It is not related to food nor is there associated water  brash.  It is not exertional.  There is no associated nausea, vomiting,  shortness of breath, or diaphoresis.  It lasts anywhere from several  minutes, but there was one night when it was intermittent most of the  night.  Because of his symptoms, we were asked to further evaluate.   MEDICATIONS AT PRESENT:  1. Valium 2.5 mg p.o. nightly.  2. Aggrenox 200/25 mg tablets 2 p.o. daily.  3. Benazepril 10 mg p.o. daily.  4. Qualaquin 324 mg daily.  5. Tramadol as needed.   ALLERGIES:  PENICILLIN.   SOCIAL HISTORY:  He has remote history of tobacco use but has not smoked  since the 1960s.  He does not consume alcohol.   FAMILY HISTORY:  Negative for coronary artery disease.   PAST MEDICAL HISTORY:  He denies any diabetes mellitus, hypertension, or  hyperlipidemia.  He does have right-sided paralysis due to a previous  horse accident.  He has had a prior knee replacement.  He has also had  laminectomy and a colon resection.  He has also had hernia repair.   REVIEW OF SYSTEMS:  Denies any headaches, fevers, or chills.  There is  no productive cough or hemoptysis.  There is a occasional mild  dysphagia  which is a chronic issue.  There is no odynophagia.  There is no melena  or hematochezia.  There is no dysuria, hematuria, amaurosis fugax.  There is no orthopnea, PND.  There is no claudication noted.  Remaining  systems are negative.   PHYSICAL EXAMINATION:  VITAL SIGNS: Blood pressure 118/78, pulse 71.  He  weighs 183 pounds.  GENERAL:  He is well-developed, well-nourished, in no acute distress.  He does not appear depressed, and there is no peripheral clubbing.  SKIN:  Warm and dry.  HEENT: Unremarkable with normal eyelids.  BACK:  Normal.  NECK:  Supple with a normal upstroke bilaterally, and I cannot  appreciate bruits.  There is no jugular venous distention and no  thyromegaly noted.  CHEST: Clear to auscultation with normal expansion.  CARDIOVASCULAR:  Regular rate and rhythm with normal S1 and S2.  There  are no murmurs, rubs, or gallops noted.  ABDOMEN:  Nontender, nondistended.  Positive bowel sounds.  No  hepatosplenomegaly and no masses appreciated. There is no abdominal  bruit.  EXTREMITIES: He has 2+ femoral pulses bilaterally with no bruits.  Extremities show no edema, and I can palpate no cords.  He has 2+  posterior tibial pulses bilaterally.  NEUROLOGIC: Exam is significant for right-sided weakness in both the  right upper and right lower extremities.  There is also some lateral  deviation of his right lower extremity.   Electrocardiogram shows sinus rhythm at a rate of 71.  There are no ST  changes noted.   DIAGNOSES:  1. Atypical chest pain.  2. History of right-sided paralysis secondary to previous horse      accident.   PLAN:  1. [name] presents with chest pain of uncertain etiology.  His      description is somewhat atypical for coronary disease.  We will      risk stratify with adenosine Myoview.  If he shows normal perfusion      and normal LV function, then we will not pursue further cardiac      workup.  Some of his symptoms may be  GI in etiology.  If his      cardiac evaluation is benign, then I have asked him to follow up      with [name] for evaluation of non-cardiac causes of chest      pain.  We will see him back on an as-needed basis.     [name] Frieze [name], MD, [facility]  Electronically Signed    BSC/MedQ  DD: 04/19/2006  DT: 04/19/2006  Job #: [id number]   cc:   [name], M.D.

## 2010-09-26 NOTE — Procedures (Signed)
EEG NUMBER:  [id number].   HISTORY:  This is a 75 year old patient with history of lethargy.  He is  being evaluated for problems with weakness.  MRI scan has shown evidence of  ventriculomegaly.  This is a routine EEG.  No skull defects are noted.   MEDICATIONS:  1.  Quinine.  2.  Flomax.  3.  Klonopin.  4.  Lotensin.  5.  Baclofen.  6.  Tylenol.   EEG CLASSIFICATION:  Normal awake and drowsy.   DESCRIPTION OF RECORDING AND BACKGROUND RHYTHM:  This recording consists of  a fairly well modulating medium amplitude Alpha rhythm of 8 Hz.  It is  reactive to eye opening and closure.  As the record progresses, patient  initially appears to be in awaking state, but as record progresses,  background rhythm activity seemed to drop out with some mild 7 Hz Theta  frequency slowing.  This is consistent with drowsiness.  Photic stimulation  is performed during the recording resulting in minimal bilateral photic drop  response.  Hyperventilation is also performed resulting in a minimal build-  up of background rhythm activity without significant slowing seen.  At no  time during the recording does there appear to be evidence of actual spike  or spike wave discharges or evidence of focal slowing.  EKG monitor shows no  evidence of cardiac rhythm abnormalities with a heart rate of 72.   IMPRESSION:  This is a normal EEG recording in waking and drowsy state.  No  evidence of ictal or interictal discharges were seen.      Marlan Palau, M.D.  Electronically Signed     YNW:GNFA  D:  06/01/2005 15:50:55  T:  06/02/2005 11:02:18  Job #:  [id number]

## 2010-09-26 NOTE — Procedures (Signed)
[facility]  Patient:    [name], [name]                         MRN: [id number] Proc. Date: 11/02/00 Adm. Date:  [id number] Attending:  Louie Bun CC:         Robert Bellow, M.D.   Procedure Report  PROCEDURE:  Colonoscopy.  INDICATION FOR PROCEDURE:  History of sigmoid colon resection secondary to colon cancer in 1996 with last surveillance colonoscopy in 1999.  DESCRIPTION OF PROCEDURE:  The patient was placed in the left lateral decubitus position and placed on the pulse monitor with continuous low-flow oxygen delivered by nasal cannula.  He was sedated with 70 mg IV Demerol and 5 mg IV Versed.  The Olympus video colonoscope was inserted into the rectum and advanced to the cecum, confirmed by transillumination at McBurneys point and visualization of the ileocecal valve and appendiceal orifice.  The prep was suboptimal and required a significant amount of lavage of semisolid and liquid stool, but eventually a reasonable view was obtainable, although I could not rule out Gruen lesions less than 1 cm in some areas.  Otherwise, the cecum, ascending, transverse, descending, and remaining sigmoid colon appeared normal with no masses, polyps, diverticula, or other mucosal abnormalities.  There was a surgical anastomosis at 15 cm with no structure, and a Rennert amount of granulation tissue with no suspicion of neoplasm.  No biopsies were taken. The rectum distal to the anastomosis appeared normal down to the anus except for some Louque internal hemorrhoids.  The colonoscope was then withdrawn and the patient returned to the recovery room in stable condition.  He tolerated the procedure well, and there were no immediate complications.  IMPRESSION:  Evidence of previous sigmoid colon resection, otherwise normal colonoscopy.  PLAN:  Repeat colonoscopy in three years. DD:  11/02/00 TD:  11/02/00 Job: [id number]

## 2010-09-26 NOTE — Procedures (Signed)
[facility]  Patient:    [name], [name]                         MRN: [id number] Proc. Date: 11/02/00 Adm. Date:  [id number] Attending:  Louie Bun CC:         Robert Bellow, M.D.   Procedure Report  PROCEDURE:  Colonoscopy.  INDICATION FOR PROCEDURE:  History of colon cancer, status post resection in 1996 with last colonoscopy three years ago.  DESCRIPTION OF PROCEDURE:  The patient was placed in the left lateral decubitus position and placed on the pulse monitor with continuous low-flow oxygen delivered by nasal cannula.  He was sedated with 70 mg IV Demerol and 5 mg IV Versed.  The Olympus video colonoscope was inserted into the rectum and advanced to the cecum, confirmed by transillumination at McBurneys point and visualization of the ileocecal valve and appendiceal orifice.  The prep was suboptimal and required a significant amount of lavage, although eventually I was able to maintain reasonable visualization but could not rule out Szwed lesions less than 1 cm in all areas.  Otherwise, the cecum, ascending, transverse, descending, and remaining sigmoid colon appeared normal with no masses, polyps, diverticula, or other mucosal abnormalities.  A surgical anastomosis was evident at about 15 cm with a Hett amount of granulation tissue at the DD:  11/02/00 TD:  11/02/00 Job: [id number]

## 2010-09-26 NOTE — H&P (Signed)
NAMESOHAM, HOLLETT NO.:  [id number]   MEDICAL RECORD NO.:  [id number]          PATIENT TYPE:  INP   LOCATION:  4707                         FACILITY:  [facility]   PHYSICIAN:  Gwen Pounds, MD       DATE OF BIRTH:  1935-12-19   DATE OF ADMISSION:  05/30/2005  DATE OF DISCHARGE:                                HISTORY & PHYSICAL   PHYSICIANS:  Primary care physician: Gaspar Garbe, M.D.  Urologist: Claudette Laws, M.D.  GI: Everardo All. Madilyn Fireman, M.D.  Neurologist: Melvyn Novas, M.D.  Neurosurgeon: Marguerita Beards, M.D.  Orthopedic surgeon: Madlyn Frankel. Charlann Boxer, M.D.   CHIEF COMPLAINT:  Weakness, anorexia, nausea, questionable syncope versus  presyncope.   HISTORY OF PRESENT ILLNESS:  This is a 75 year old male with history of  colon cancer status post resection in 1996, right hemiplegia, mostly sensory  with some motor secondary to a horse accident in 56.  He still walks  appropriately.  Benign tremor, hiatal hernia, hypertriglyceridemia,  hypertension, recent L3 to L5 laminectomy in December 2006 in [place].  Approximately 2 weeks ago, the patient had this feeling of weakness. He felt  like he was going to fall.  The weakness was described as diffuse and  nonfocal.  He had no chest pain, no shortness of breath, no slurred speech.  Did not feel like he was going to pass out.  With continuing questioning, he  reports it as just overall extreme weakness.  The wife gave him some orange  juice, and the patient felt instantly better.  The second episode, the  patient had the wife check his blood sugar, and it was 94.  He was given OJ  anyway, and symptoms improved again.  He had recurrent symptoms over the  weekend and saw Dr. [name] on May 19, 2005, with a negative exam.  CBC  was fine.  TSH was normal.  He went to Dr. Shirley Muscat on May 25, 2005, and she  did not think the 5 to 7 per day weakness episodes were coming from her  surgery.  Over this first week, the  patient also started developing some  anorexia and no desire for food.  He would only eat because it was the only  thing that would resolve his symptoms.  Dr. Shirley Muscat said to go to Dr. Vickey Huger.  This appointment was set up for Tuesday, but on Monday night he was up and  down all night with significant symptoms and weakness and was in the  emergency department all night.  Labs, EKG monitoring, and urinalysis were  all fine.  He was discharged, and the appointment was moved from Tuesday to  Friday.   He saw Dr. Vickey Huger who did not think this was neurologic because of lack of  localizing symptoms and sent them for a Holter monitor.  The Holter monitor  was done.  He reported all of his symptoms. Over the last 24 hours, he had  18+ episodes of weakness, hunger, nausea, one feeling of rapid heart beat,  and no other symptoms.  He  was sent to the emergency room for further  evaluation and treatment.  Currently he is looking and feeling well but  nervous about his symptoms.  In the ED, he was only given IV fluids.  Labs  were done, all negative, and I was called for inpatient admission.   PAST MEDICAL HISTORY:  1.  Colon cancer status post resection in 1996.  2.  History of polypectomy in October 2005.  3.  Questionable history of stroke with ischemic changes seen on MRI.  4.  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  5.  Benign essential tremor.  6.  Hiatal hernia.  7.  Hypertriglyceridemia.  8.  Hypertension diagnosed in August 2006.  9.  L3 to L5 laminectomy.  10. History of bilateral hernia repair.  11. History of bilateral total knee replacement.  12. BPH.   ALLERGIES:  PENICILLIN.   MEDICATIONS:  1.  Baclofen 500 mg 4 times per day.  2.  Quinine 325 twice daily.  3.  Flomax 0.4 daily.  4.  Clonazepam 1 mg 4 times a day.  5.  Benazepril 10 daily.  6.  Tylenol p.r.n.   SOCIAL HISTORY:  He is married, disabled.  He quit tobacco in 1960s, no  alcohol.   FAMILY  HISTORY:  Pneumonia and cancer.   REVIEW OF SYSTEMS:  [facility]  had the ruptured disk.  He denies any chest pain, shortness of breath. No  ear, nose, or throat symptoms, abdominal or bowel symptoms.  He denies any  change in position influences his underlying symptoms.  He did wear a Foley  catheter one week after surgery and removed it and ended up with urinary  retention and needed the Foley replaced by Dr. Etta Grandchild.  He denies any melena.  He denies any bright red blood per rectum, denies any changes in his overall  physical conditioning.  Currently there are no symptoms while he is talking  me while in bed.  Please see HPI for details of the reason for  hospitalization.   PHYSICAL EXAMINATION:  VITAL SIGNS: Blood pressure 132/79 up to 148/87,  heart rate 78, respiratory rate 18, saturating 97% on room air. Temperature  97.7.  GENERAL: Alert and oriented x3.  EARS, NOSE, AND THROAT:  Oropharynx moist.  NECK:  No JVD.  No bruits noted.  EYES: PERRLA, EOMI.  PULMONARY: Clear to auscultation bilaterally.  CARDIAC: Regular.  ABDOMEN: Soft, distended.  He is overweight. Midline surgical incision  noted. No rebound, no guarding.  EXTREMITIES: No clubbing, no cyanosis, no edema.  NEUROLOGIC: Strength right greater than left but appropriate for him based  on his chronic history.  BACK: Healing cervical scar noted.  SKIN: Right knee scar noted.   ANCILLARY DATA:  EKG showed normal sinus rhythm, heart rate 68.   CK and troponin are negative.  White count 7.3, hemoglobin 14.8, platelet  count 224.  Urinalysis negative. INR 1. Sodium 136, potassium 4.2, chloride  102, bicarb 28, BUN 6, creatinine 0.9, glucose 96.  LFTs within normal  limits.  Lipase 68.  D-dimer 431.   Chest x-ray: No acute disease.   The only new medication includes Baclofen from October 2006 and Benazepril  from August 2006.  ASSESSMENT:  This is a 75 year old man with  weakness, anorexia, nausea,  presyncope of questionable etiology.  There is no real new medications,  especially since surgery.  He is off Darvocet that was  causing some  constipation.  Some recent issues include recent back surgery in early  December and urinary retention following the surgery.  He had cranial CT in  November 2006 with generalized atrophy, prominence in the cerebellum, and  chronic ischemic changes.  He did have an MRI back in August of 2005 which  showed ventriculomegaly, mild proptosis, and history of infarcts.   PLAN:  1.  Admit.  2.  Rule out any cardiac issues; place him on telemetry.  3.  Rule out hypoglycemia.  We will check CBG q.4h.  4.  Unlikely, but rule out insulinoma. Will check C-peptide and insulin      levels at this time.  5.  Rule out gastrointestinal issues or pathology. Secondary to the nausea      and anorexia, we will check abdominal and pelvic CT with p.o. and IV      contrast.  6.  Rule out neurologic issue.  We will repeat and MRI and will get the MRI      with intracranial and extracranial MRA.  7.  Rule out severe orthostasis, whether from Flomax or benazepril or from      both.  We will check orthostatic blood pressure twice over the next day.  8.  Will continue home medications at this current time until we know for      sure what is going on with him.  9.  Low threshold for neurologic or GI evaluation.  10. Heme check all stools.  11. Check a CEA level.  12. Observe carefully.  13. May need sleep deprived EEG if thought to be a seizure.  14. Follow up on amylase and lipase in the morning.      Gwen Pounds, MD  Electronically Signed     JMR/MEDQ  D:  05/30/2005  T:  05/31/2005  Job:  [id number]   cc:   Gaspar Garbe, M.D.  Fax: [phone]   Claudette Laws, M.D.  Fax: [phone]   Everardo All. Madilyn Fireman, M.D.  Fax: [phone]   Melvyn Novas, M.D.  Fax: [phone]   Marguerita Beards, M.D.  [place] D. Charlann Boxer, M.D.  Fax:  [phone]

## 2010-09-26 NOTE — Op Note (Signed)
NAMEEDVIN, ALBUS                            ACCOUNT NO.:  [id number]   MEDICAL RECORD NO.:  [id number]                   PATIENT TYPE:  INP   LOCATION:  X006                                 FACILITY:  [facility]   PHYSICIAN:  Madlyn Frankel. Charlann Boxer, M.D.               DATE OF BIRTH:  Aug 06, 1935   DATE OF PROCEDURE:  10/16/2003  DATE OF DISCHARGE:                                 OPERATIVE REPORT   PREOPERATIVE DIAGNOSIS:  Right knee end-stage osteoarthritis associated with  mild valgus deformity.   POSTOPERATIVE DIAGNOSIS:  Right knee end-stage osteoarthritis associated  with mild valgus deformity.   PROCEDURE:  Right total knee replacement.   COMPONENTS:  PFC Sigma knee system from [employer] with a size 5 2C3 femur, a  size 4 mobile bearing tray, revision cemented tray, with a 13 x 30 cemented  stem, a 12.5 size 5 rotating platform poly and a size 38 patella peg.   SURGEON:  Madlyn Frankel. Charlann Boxer, M.D.   ASSISTANT:  Clarene Reamer, P.A.-C.   ANESTHESIA:  General.   ESTIMATED BLOOD LOSS:  None.   DRAINS:  One.   COMPLICATIONS:  None.   INDICATIONS FOR PROCEDURE:  Mr. Derksen is a very pleasant 75 year old  gentleman who had an unfortunate accident 30-some years ago.  He has had  right-sided hemiparesis since that time; however, he developed significant  lateral compartmental degenerative changes in his right knee.  This has  produced a significant amount of pain to him.  He presented with  approximately 4+/5 quadriceps and hamstrings strength in his right lower  extremity and had very little sensitivity or ability to sense light touch.  Despite this, he had a significant amount of pain, and this significantly  inhibited his quality of life.  After reviewing the risks and benefits of  this case with him, which included failure to provide adequate pain relief  as well as chronic instability, given his muscle function, he consented for  right total knee replacement.  I chose to use revision-type  stems in case he  were to develop instability later in his life, and that way, the femoral  component could merely be changed and switched to a hinge component as  needed.  Questions were encouraged and answered.  I feel that he has  adequate strength in his quadriceps mechanism to rehab appropriately and  maintain his maximum motion.   PROCEDURE IN DETAIL:  Patient was brought to the operating theater.  Once  preoperative anesthesia and preoperative antibiotics, 1 gram of vancomycin,  were administered, the patient was positioned supine in the right leg and  proximal thigh tourniquet.  The right lower extremity was then prepped and  draped in a sterile fashion.  A midline incision was then made, followed by  a medial parapatellar arthrotomy.  Following knee exposure, patellar  osteotomy was made, using an oscillating saw.  A prepatellar cut was  26 mm,  then taken down for 14 mm for a 38 mm patella.  The attention was then  directed to the femoral component, where an intramedullary device was passed  to 5 degrees valgus, and 10 mm of bone was dissected off the distal femur.  A sizing jig was placed, and it was determined that a size 5 femur would be  placed.  The femoral component, size 5, was placed.  Anterior and posterior  chamfer cuts were made.  At this point, the box cutting guide for the TC3  femur was brought to the field and placed on the lateral aspect of the  medial femoral condyle and pinned into position.  The box cut was then made.  At this point, the trial was placed and deemed to fit anatomically.  Following this, osteophytes were removed off of the posterior femur.  At  this point, the attention was directed to the tibia.  The tibia was  subluxated anteriorly.  Based on the fact that we were going to use a mobile-  bearing tray, or MBT cemented tray, I went ahead and did an intramedullary  system.  Reamed up to a 16 mm for a 3 mm mantle of a 13 mm stem.  With the   intramedullary device in, a 2 degree cut was made off of the distal tibia,  in line with the anterior spine of the tibia.  Following the left tibial  cut, it was determined that a size 4 tibial tray would fit best on the cut  surface of the distal tibia.  Trial reduction was carried down.  The knee  came out to some hyperextension with a size 10 poly but with a 12.5 poly,  the knee came out to full extension with stable and excellent stability on  flexion and extension.  A 38 patellar button was placed in the patella  __________ pressure.  With this in mind, the final components were brought  to the field.  The bone cuts were prepared.  A size 5 cement plug was placed  distally.  Once cement was repaired, and the bone cut surfaces dried, the  tibial component was cemented into position, and excessive cement was  removed.  The final femoral component was then cemented into position with a  12.5 insert in place to allow for compression.  The patella was then  cemented into position.  Following the curing of cement, the excess cement  was removed using the osteotome.  The tourniquet was let down, and the knee  copiously irrigated.  The final 12.5 liner was then placed into the tray.  The knee reduced.  The knee was again copiously irrigated.  The extensor  mechanism was reapproximated with #1 Ethibond.  The medium and Hemovac drain  was placed deep.  The remaining wound was closed in layers with a 4-0  Monocryl.  The knee was clean, dried, and dressed sterilely with Steri-  Strips and a bulky and dry sterile dressing.  The patient tolerated the  procedure without complications and transferred to the recovery room.                                               Madlyn Frankel Charlann Boxer, M.D.    MDO/MEDQ  D:  10/16/2003  T:  10/16/2003  Job:  [id number]

## 2010-09-26 NOTE — H&P (Signed)
[name], [name]                            ACCOUNT NO.:  [id number]   MEDICAL RECORD NO.:  [id number]                   PATIENT TYPE:  INP   LOCATION:  NA                                   FACILITY:  [facility]   PHYSICIAN:  Madlyn Frankel. Charlann Boxer, M.D.               DATE OF BIRTH:  1936/03/02   DATE OF ADMISSION:  10/16/2003  DATE OF DISCHARGE:                                HISTORY & PHYSICAL   CHIEF COMPLAINT:  Right knee pain.   HISTORY OF PRESENT ILLNESS:  The patient is a 75 year old male with a  significant past medical history of hemiparesis on the right side due to an  unfortunate accident. He presented to the clinic complaining of continuing  pain and instability of the right knee for about 33 years now.  He does  state, however, that he has had an increasing amount of pain since last  December.  He complains of giving out and falling with the knee and also  some popping and clicking in the knee.  He has previously tried Synvisc  which has failed and he states it made his knee feel even more rubbery  which tends to give out more now. Radiographs taken in the office revealed  bone on bone arthritis in the lateral compartment of the right knee.  Upon  reviewing these x-rays, Dr. Charlann Boxer feels it is best to proceed with a right  total knee arthroplasty.  The patient agrees and the risks and benefits of  the surgery were discussed with the patient and the patient wishes to  proceed.   PAST MEDICAL HISTORY:  1. Hemiparesis on the right side.  2. Hiatal hernia.   PAST SURGICAL HISTORY:  Surgery and hernia repair.   MEDICATIONS:  1. Robaxin 500 mg, 1 p.o. q.i.d. p.r.n.  2. Quinine 325 mg, 1 p.o. b.i.d.  3. Aleve p.r.n.  4. Tylenol p.r.n.   ALLERGIES:  PENICILLIN causes anaphylaxis.   SOCIAL HISTORY:  The patient denies any tobacco or alcohol use.  He is  married and lives in a one story house with three steps entering his house  and his wife will be his care giver after  surgery.   FAMILY HISTORY:  Mother with multiple myeloma and osteoarthritis and father  with COPD.   REVIEW OF SYMPTOMS:  GENERAL:  Denies fever, chills, night sweats, bleeding  tendencies.  CNS:  Denies blurred or double vision, seizures, headaches,  paralysis.  RESPIRATORY:  Denies shortness of breath, productive cough,  hemoptysis.  CARDIOVASCULAR:  Denies chest pain, angina, orthopnea.  GI:  Denies __________ diarrhea, constipation, melena or bloody stools. GU:  Denies dysuria, hematuria or discharge.  MUSCULOSKELETAL:  Pertinent to HPI.   PHYSICAL EXAMINATION:  VITAL SIGNS:  Blood pressure 124/80, pulse 68,  respirations 12.  GENERAL:  A well-developed, well-nourished, 75 year old male.  HEENT:  Normocephalic, pupils equal round and reactive to light.  NECK:  Supple, no carotid bruits.  CHEST:  Clear to auscultation bilaterally.  No wheezes or crackles.  HEART:  Regular rate and rhythm, no murmurs, rubs or gallops.  ABDOMEN:  Soft, nontender, nondistended, positive bowel sounds x4.  EXTREMITIES:  Reveals range of motion over the right knee from 0 to 120  degrees.  He has no evidence of infection on his skin, he has 4/5 quad  strength and stable appearing ligaments.  SKIN:  No rashes or lesions.   X-rays reveals end-stage osteoarthritis of the right knee.   IMPRESSION:  1. Osteoarthritis of the right knee.  2. Right sided hemiparesis.  3. Hiatal hernia.   PLAN:  The patient will be admitted to [facility] on October 16, 2003  to undergo a right total knee arthroplasty by Dr. Durene Romans.     Clarene Reamer, P.A.-C.                   Madlyn Frankel Charlann Boxer, M.D.    SW/MEDQ  D:  10/10/2003  T:  10/11/2003  Job:  [id number]   cc:   Gaspar Garbe, M.D.  [address]  [place]  [place] [address]  Fax: [phone]

## 2010-09-29 ENCOUNTER — Ambulatory Visit: Payer: Medicare Other | Admitting: Physical Therapy

## 2010-10-07 ENCOUNTER — Ambulatory Visit: Payer: Medicare Other | Admitting: Physical Therapy

## 2010-10-13 ENCOUNTER — Ambulatory Visit: Payer: Medicare Other | Attending: Neurology | Admitting: Physical Therapy

## 2010-10-13 DIAGNOSIS — R42 Dizziness and giddiness: Secondary | ICD-10-CM | POA: Insufficient documentation

## 2010-10-13 DIAGNOSIS — IMO0001 Reserved for inherently not codable concepts without codable children: Secondary | ICD-10-CM | POA: Insufficient documentation

## 2010-10-21 ENCOUNTER — Encounter: Payer: Medicare Other | Admitting: Physical Therapy

## 2010-10-21 ENCOUNTER — Ambulatory Visit: Payer: Medicare Other | Admitting: Physical Therapy

## 2010-12-30 ENCOUNTER — Emergency Department (HOSPITAL_COMMUNITY)
Admission: EM | Admit: 2010-12-30 | Discharge: 2010-12-31 | Disposition: A | Discharge status: Home / Self Care | Payer: Medicare Other | Attending: Emergency Medicine | Admitting: Emergency Medicine

## 2010-12-30 DIAGNOSIS — G8190 Hemiplegia, unspecified affecting unspecified side: Secondary | ICD-10-CM | POA: Insufficient documentation

## 2010-12-30 DIAGNOSIS — I10 Essential (primary) hypertension: Secondary | ICD-10-CM | POA: Insufficient documentation

## 2010-12-30 DIAGNOSIS — T783XXA Angioneurotic edema, initial encounter: Secondary | ICD-10-CM | POA: Insufficient documentation

## 2010-12-30 DIAGNOSIS — X58XXXA Exposure to other specified factors, initial encounter: Secondary | ICD-10-CM | POA: Insufficient documentation

## 2011-02-04 ENCOUNTER — Ambulatory Visit: Payer: Medicare Other | Attending: Neurology | Admitting: Physical Therapy

## 2011-02-04 DIAGNOSIS — R42 Dizziness and giddiness: Secondary | ICD-10-CM | POA: Insufficient documentation

## 2011-02-04 DIAGNOSIS — IMO0001 Reserved for inherently not codable concepts without codable children: Secondary | ICD-10-CM | POA: Insufficient documentation

## 2011-02-13 ENCOUNTER — Ambulatory Visit: Payer: Medicare Other | Attending: Neurology | Admitting: Physical Therapy

## 2011-02-13 DIAGNOSIS — IMO0001 Reserved for inherently not codable concepts without codable children: Secondary | ICD-10-CM | POA: Insufficient documentation

## 2011-02-13 DIAGNOSIS — R42 Dizziness and giddiness: Secondary | ICD-10-CM | POA: Insufficient documentation

## 2011-02-18 ENCOUNTER — Ambulatory Visit: Payer: Medicare Other | Admitting: Physical Therapy

## 2011-02-25 ENCOUNTER — Ambulatory Visit: Payer: Medicare Other | Admitting: Physical Therapy

## 2011-02-26 ENCOUNTER — Emergency Department (HOSPITAL_COMMUNITY)
Admission: EM | Admit: 2011-02-26 | Discharge: 2011-02-26 | Disposition: A | Discharge status: Home / Self Care | Payer: Medicare Other | Attending: Emergency Medicine | Admitting: Emergency Medicine

## 2011-02-26 DIAGNOSIS — E785 Hyperlipidemia, unspecified: Secondary | ICD-10-CM | POA: Insufficient documentation

## 2011-02-26 DIAGNOSIS — G8190 Hemiplegia, unspecified affecting unspecified side: Secondary | ICD-10-CM | POA: Insufficient documentation

## 2011-02-26 DIAGNOSIS — I10 Essential (primary) hypertension: Secondary | ICD-10-CM | POA: Insufficient documentation

## 2011-02-26 DIAGNOSIS — T783XXA Angioneurotic edema, initial encounter: Secondary | ICD-10-CM | POA: Insufficient documentation

## 2011-02-26 DIAGNOSIS — X58XXXA Exposure to other specified factors, initial encounter: Secondary | ICD-10-CM | POA: Insufficient documentation

## 2011-02-26 DIAGNOSIS — IMO0002 Reserved for concepts with insufficient information to code with codable children: Secondary | ICD-10-CM | POA: Insufficient documentation

## 2011-03-02 ENCOUNTER — Ambulatory Visit: Payer: Medicare Other | Admitting: Physical Therapy

## 2011-03-11 ENCOUNTER — Ambulatory Visit: Payer: Medicare Other | Admitting: Physical Therapy

## 2011-03-25 ENCOUNTER — Ambulatory Visit: Payer: Medicare Other | Attending: Neurology | Admitting: Physical Therapy

## 2011-03-25 DIAGNOSIS — R42 Dizziness and giddiness: Secondary | ICD-10-CM | POA: Insufficient documentation

## 2011-03-25 DIAGNOSIS — IMO0001 Reserved for inherently not codable concepts without codable children: Secondary | ICD-10-CM | POA: Insufficient documentation

## 2011-03-31 ENCOUNTER — Ambulatory Visit: Payer: Medicare Other | Admitting: Physical Therapy

## 2011-04-08 ENCOUNTER — Ambulatory Visit: Payer: Medicare Other | Admitting: Physical Therapy

## 2011-04-15 ENCOUNTER — Ambulatory Visit: Payer: Medicare Other | Attending: Neurology | Admitting: Physical Therapy

## 2011-04-15 DIAGNOSIS — R42 Dizziness and giddiness: Secondary | ICD-10-CM | POA: Insufficient documentation

## 2011-04-15 DIAGNOSIS — IMO0001 Reserved for inherently not codable concepts without codable children: Secondary | ICD-10-CM | POA: Insufficient documentation

## 2011-05-07 ENCOUNTER — Emergency Department (HOSPITAL_COMMUNITY)
Admission: EM | Admit: 2011-05-07 | Discharge: 2011-05-08 | Disposition: A | Discharge status: Home / Self Care | Payer: Medicare Other | Attending: Emergency Medicine | Admitting: Emergency Medicine

## 2011-05-07 ENCOUNTER — Encounter: Payer: Self-pay | Admitting: Emergency Medicine

## 2011-05-07 DIAGNOSIS — T783XXA Angioneurotic edema, initial encounter: Secondary | ICD-10-CM

## 2011-05-07 DIAGNOSIS — R221 Localized swelling, mass and lump, neck: Secondary | ICD-10-CM | POA: Insufficient documentation

## 2011-05-07 DIAGNOSIS — X58XXXA Exposure to other specified factors, initial encounter: Secondary | ICD-10-CM | POA: Insufficient documentation

## 2011-05-07 DIAGNOSIS — R220 Localized swelling, mass and lump, head: Secondary | ICD-10-CM | POA: Insufficient documentation

## 2011-05-07 HISTORY — DX: Hemiplegia, unspecified affecting unspecified side: G81.90

## 2011-05-07 MED ORDER — FAMOTIDINE 20 MG PO TABS
ORAL_TABLET | ORAL | Status: AC
Start: 2011-05-07 — End: 2011-05-07
  Administered 2011-05-07: 20 mg
  Filled 2011-05-07: qty 1

## 2011-05-07 MED ORDER — FAMOTIDINE 20 MG PO TABS: 20.0000 mg | ORAL_TABLET | Freq: Once | ORAL | Status: AC

## 2011-05-07 MED ORDER — FAMOTIDINE IN NACL 20-0.9 MG/50ML-% IV SOLN: 20.0000 mg | Freq: Once | INTRAVENOUS | Status: DC

## 2011-05-07 MED ORDER — DIPHENHYDRAMINE HCL 25 MG PO CAPS
25.0000 mg | ORAL_CAPSULE | Freq: Once | ORAL | Status: AC
  Administered 2011-05-07: 25 mg via ORAL
  Filled 2011-05-07: qty 1

## 2011-05-07 MED ORDER — PREDNISONE 20 MG PO TABS
60.0000 mg | ORAL_TABLET | Freq: Once | ORAL | Status: AC
  Administered 2011-05-07: 60 mg via ORAL
  Filled 2011-05-07: qty 3

## 2011-05-07 NOTE — ED Provider Notes (Signed)
History     CSN: [id number]  Arrival date & time 05/07/11  1958   First MD Initiated Contact with Patient 05/07/11 2042      HPI Spouse reports they were eating at a friends house when he suddenly developed lower lip swelling. Reports she immediately gave him his epipen and came into the ED. Reports lip swelling has improved. States 2 prior similar episodes with unknown cause. States he was taken off of his ACE-i and had multiple allergy tests. Reports his last visit he almost needed intubation due to the severity of swelling.  Patient is a 75 y.o. male presenting with allergic reaction. The history is provided by the patient.  Allergic Reaction The primary symptoms are  angioedema. The primary symptoms do not include wheezing, shortness of breath, cough, nausea, vomiting, diarrhea, palpitations, altered mental status, rash or urticaria. The current episode started less than 1 hour ago. The problem has been gradually improving. This is a new problem.  The angioedema began less than 1 hour ago. The angioedema has been gradually improving since its onset. It is located on the lips. The angioedema is not associated with shortness of breath.     Past Medical History  Diagnosis Date  . Hemiplegia     Past Surgical History  Procedure Date  . Back surgery   . Total knee arthroplasty   . Hernia repair   . Laminectomy     History reviewed. No pertinent family history.  History  Substance Use Topics  . Smoking status: Never Smoker   . Smokeless tobacco: Never Used  . Alcohol Use: No      Review of Systems  HENT: Positive for facial swelling. Negative for trouble swallowing.   Respiratory: Negative for cough, shortness of breath and wheezing.   Cardiovascular: Negative for chest pain and palpitations.  Gastrointestinal: Negative for nausea, vomiting and diarrhea.  Skin: Negative for rash.  Psychiatric/Behavioral: Negative for altered mental status.  All other systems reviewed  and are negative.    Allergies  Penicillins  Home Medications   Current Outpatient Rx  Name Route Sig Dispense Refill  . CALCIUM + D PO Oral Take 1 tablet by mouth daily.      Marland Kitchen VITAMIN D 1000 UNITS PO TABS Oral Take 1,000 Units by mouth daily.      Marland Kitchen CLONAZEPAM 1 MG PO TABS Oral Take 0.5-1 mg by mouth at bedtime as needed. For sleep.     Marland Kitchen EPINEPHRINE 0.3 MG/0.3ML IJ DEVI Intramuscular Inject 0.3 mg into the muscle once.      Marland Kitchen MECLIZINE HCL 25 MG PO TABS Oral Take 25 mg by mouth 3 (three) times daily as needed. For dizziness.     . ADULT MULTIVITAMIN W/MINERALS CH Oral Take 1 tablet by mouth daily.      Marland Kitchen MIRAPEX PO Oral Take 3 tablets by mouth at bedtime.      Marland Kitchen PRAVASTATIN SODIUM PO Oral Take 1 tablet by mouth daily.      Marland Kitchen PROPRANOLOL HCL 60 MG PO TABS Oral Take 60 mg by mouth daily.        BP 148/86  Pulse 73  Temp(Src) 97.7 F (36.5 C) (Oral)  Resp 16  SpO2 99%  Physical Exam  Constitutional: He is oriented to person, place, and time. He appears well-developed and well-nourished.  HENT:  Head: Normocephalic and atraumatic.  Mouth/Throat: Oropharynx is clear and moist. No uvula swelling.       Lower lip swelling pharynx clear.  No tongue edema  Eyes: Conjunctivae are normal. Pupils are equal, round, and reactive to light.  Neck: Normal range of motion. Neck supple.  Cardiovascular: Normal rate, regular rhythm and normal heart sounds.   Pulmonary/Chest: Effort normal and breath sounds normal. He has no wheezes. He has no rales. He exhibits no tenderness.  Abdominal: Soft. Bowel sounds are normal.  Neurological: He is alert and oriented to person, place, and time.  Skin: Skin is warm and dry. No rash noted. No erythema. No pallor.  Psychiatric: He has a normal mood and affect. His behavior is normal.    ED Course  Procedures   MDM  Patient observed for 4 hours in the ED. Swelling has improved. Will dc with prednisone, benadryl and epi-pen refill. Ptient and family  agree with plan and are ready for d/c. Advised immediate return for worsening symptoms      [name], [place] 05/08/11 0103  [name] Lot, PA 05/08/11 [phone]

## 2011-05-07 NOTE — ED Notes (Signed)
Pt was eating at a friends house and states that he was not eating anything new when his lips started swelling. States he was in the hospital before for 5 days with similar symptoms. He is having no trouble talking or breathing at this time.

## 2011-05-08 MED ORDER — EPINEPHRINE 0.3 MG/0.3ML IJ DEVI: 0.3000 mg | INTRAMUSCULAR | Status: DC | PRN

## 2011-05-08 MED ORDER — PREDNISONE (PAK) 10 MG PO TABS: ORAL_TABLET | ORAL | Status: AC

## 2011-05-08 NOTE — ED Provider Notes (Signed)
Medical screening examination/treatment/procedure(s) were conducted as a shared visit with non-physician practitioner(s) and myself.  I personally evaluated the patient during the encounter  Hx angioedema, lower lip swelling.  Received epi pen.  No wheezing, drooling, difficulty swallowing.   OP clear, no uvular edema.  Lower lip swelling, no tongue elevation.  Lungs clear. Unknown etiology of angioedema despite extensive workup.  Glynn Octave, MD 05/08/11 [phone]

## 2011-05-27 ENCOUNTER — Ambulatory Visit: Payer: Medicare Other | Attending: Neurology | Admitting: Physical Therapy

## 2011-05-27 DIAGNOSIS — R42 Dizziness and giddiness: Secondary | ICD-10-CM | POA: Insufficient documentation

## 2011-05-27 DIAGNOSIS — IMO0001 Reserved for inherently not codable concepts without codable children: Secondary | ICD-10-CM | POA: Insufficient documentation

## 2011-08-18 ENCOUNTER — Other Ambulatory Visit (HOSPITAL_COMMUNITY): Payer: Self-pay | Admitting: Internal Medicine

## 2011-08-18 DIAGNOSIS — R110 Nausea: Secondary | ICD-10-CM

## 2011-08-18 DIAGNOSIS — D649 Anemia, unspecified: Secondary | ICD-10-CM

## 2011-08-21 ENCOUNTER — Encounter (HOSPITAL_COMMUNITY)
Admission: RE | Admit: 2011-08-21 | Discharge: 2011-08-21 | Disposition: A | Discharge status: Home / Self Care | Payer: Medicare Other | Source: Ambulatory Visit | Attending: Internal Medicine | Admitting: Internal Medicine

## 2011-08-21 DIAGNOSIS — D649 Anemia, unspecified: Secondary | ICD-10-CM

## 2011-08-21 DIAGNOSIS — R112 Nausea with vomiting, unspecified: Secondary | ICD-10-CM | POA: Insufficient documentation

## 2011-08-21 DIAGNOSIS — R110 Nausea: Secondary | ICD-10-CM

## 2011-08-21 MED ORDER — TECHNETIUM TC 99M MEBROFENIN IV KIT
5.5000 | PACK | Freq: Once | INTRAVENOUS | Status: AC | PRN
  Administered 2011-08-21: 5.5 via INTRAVENOUS

## 2011-08-21 MED ORDER — SINCALIDE 5 MCG IJ SOLR: 0.0200 ug/kg | Freq: Once | INTRAMUSCULAR | Status: DC

## 2012-04-19 ENCOUNTER — Encounter (HOSPITAL_COMMUNITY): Payer: Self-pay

## 2012-04-19 ENCOUNTER — Emergency Department (HOSPITAL_COMMUNITY): Payer: Medicare Other

## 2012-04-19 ENCOUNTER — Emergency Department (HOSPITAL_COMMUNITY)
Admission: EM | Admit: 2012-04-19 | Discharge: 2012-04-19 | Disposition: A | Discharge status: Home / Self Care | Payer: Medicare Other | Attending: Emergency Medicine | Admitting: Emergency Medicine

## 2012-04-19 DIAGNOSIS — Z791 Long term (current) use of non-steroidal anti-inflammatories (NSAID): Secondary | ICD-10-CM | POA: Insufficient documentation

## 2012-04-19 DIAGNOSIS — Y9301 Activity, walking, marching and hiking: Secondary | ICD-10-CM | POA: Insufficient documentation

## 2012-04-19 DIAGNOSIS — W19XXXA Unspecified fall, initial encounter: Secondary | ICD-10-CM | POA: Insufficient documentation

## 2012-04-19 DIAGNOSIS — S7000XA Contusion of unspecified hip, initial encounter: Secondary | ICD-10-CM | POA: Insufficient documentation

## 2012-04-19 DIAGNOSIS — S5000XA Contusion of unspecified elbow, initial encounter: Secondary | ICD-10-CM | POA: Insufficient documentation

## 2012-04-19 DIAGNOSIS — S40019A Contusion of unspecified shoulder, initial encounter: Secondary | ICD-10-CM | POA: Insufficient documentation

## 2012-04-19 DIAGNOSIS — S8000XA Contusion of unspecified knee, initial encounter: Secondary | ICD-10-CM

## 2012-04-19 DIAGNOSIS — Z7982 Long term (current) use of aspirin: Secondary | ICD-10-CM | POA: Insufficient documentation

## 2012-04-19 DIAGNOSIS — Z8669 Personal history of other diseases of the nervous system and sense organs: Secondary | ICD-10-CM | POA: Insufficient documentation

## 2012-04-19 DIAGNOSIS — Y92009 Unspecified place in unspecified non-institutional (private) residence as the place of occurrence of the external cause: Secondary | ICD-10-CM | POA: Insufficient documentation

## 2012-04-19 DIAGNOSIS — I69959 Hemiplegia and hemiparesis following unspecified cerebrovascular disease affecting unspecified side: Secondary | ICD-10-CM | POA: Insufficient documentation

## 2012-04-19 HISTORY — DX: Dizziness and giddiness: R42

## 2012-04-19 MED ORDER — OXYCODONE-ACETAMINOPHEN 5-325 MG PO TABS: 1.0000 | ORAL_TABLET | Freq: Four times a day (QID) | ORAL | Status: DC | PRN

## 2012-04-19 MED ORDER — HYDROMORPHONE HCL PF 1 MG/ML IJ SOLN
0.5000 mg | Freq: Once | INTRAMUSCULAR | Status: AC
  Administered 2012-04-19: 0.5 mg via INTRAMUSCULAR
  Filled 2012-04-19: qty 1

## 2012-04-19 NOTE — ED Notes (Signed)
[id number]<AV> Expected date:04/19/12<BR> Expected time: 7:21 AM<BR> Means of arrival:Ambulance<BR> Comments:<BR> 76yoM/fall

## 2012-04-19 NOTE — ED Notes (Signed)
Per EMS- Patient's wife found the patient lying on the living room floor this AM. Patient c/o right hip, elbow, and shoulder pain. No deformities or rotation noted.

## 2012-04-19 NOTE — ED Provider Notes (Signed)
History     CSN: [id number]  Arrival date & time 04/19/12  4098   First MD Initiated Contact with Patient 04/19/12 [phone]      Chief Complaint  Patient presents with  . Fall    right shoulder, right hip, and right elbow pain    (Consider location/radiation/quality/duration/timing/severity/associated sxs/prior treatment) Patient is a 76 y.o. male presenting with fall. The history is provided by the patient.  Fall The accident occurred less than 1 hour ago. Pertinent negatives include no numbness, no abdominal pain, no nausea, no vomiting and no headaches.   patient presents after a fall. He was walking and fell onto his right side. Has a history of both hemiplegia and vertigo. Falls not unusual for him. Is complaining of pain in his right shoulder right elbow right wrist right hip and right knee. No new weakness. He states he did not hit his head. He does not want anything for pain. No chest or abdominal pain. He states the pain is worst in his right hip.   Past Medical History  Diagnosis Date  . Hemiplegia   . Vertigo     Past Surgical History  Procedure Date  . Back surgery   . Total knee arthroplasty   . Hernia repair   . Laminectomy     No family history on file.  History  Substance Use Topics  . Smoking status: Never Smoker   . Smokeless tobacco: Never Used  . Alcohol Use: No      Review of Systems  Constitutional: Negative for activity change and appetite change.  HENT: Negative for neck stiffness.   Eyes: Negative for pain.  Respiratory: Negative for chest tightness and shortness of breath.   Cardiovascular: Negative for chest pain and leg swelling.  Gastrointestinal: Negative for nausea, vomiting, abdominal pain and diarrhea.  Genitourinary: Negative for flank pain.  Musculoskeletal: Negative for back pain.  Skin: Negative for rash.  Neurological: Positive for dizziness and weakness. Negative for numbness and headaches.  Psychiatric/Behavioral: Negative  for behavioral problems.    Allergies  Penicillins  Home Medications   Current Outpatient Rx  Name  Route  Sig  Dispense  Refill  . ASPIRIN 325 MG PO TABS   Oral   Take 325 mg by mouth daily.         Marland Kitchen CALCIUM + D PO   Oral   Take 1 tablet by mouth daily.          Marland Kitchen VITAMIN D 1000 UNITS PO TABS   Oral   Take 1,000 Units by mouth daily.           Marland Kitchen CLONAZEPAM 1 MG PO TABS   Oral   Take 0.5-1 mg by mouth at bedtime as needed. For sleep.          Marland Kitchen EPINEPHRINE 0.3 MG/0.3ML IJ DEVI   Intramuscular   Inject 0.3 mg into the muscle once.           Marland Kitchen MECLIZINE HCL 25 MG PO TABS   Oral   Take 25 mg by mouth 3 (three) times daily as needed. For dizziness.          . ADULT MULTIVITAMIN W/MINERALS CH   Oral   Take 1 tablet by mouth daily.           Marland Kitchen MIRAPEX PO   Oral   Take 3 tablets by mouth at bedtime.          Marland Kitchen PRAVASTATIN SODIUM PO  Oral   Take 1 tablet by mouth daily.          Marland Kitchen PROPRANOLOL HCL 60 MG PO TABS   Oral   Take 60 mg by mouth as needed.          . OXYCODONE-ACETAMINOPHEN 5-325 MG PO TABS   Oral   Take 1-2 tablets by mouth every 6 (six) hours as needed for pain.   20 tablet   0     BP 133/77  Pulse 54  Temp 97.6 F (36.4 C) (Oral)  Resp 16  SpO2 99%  Physical Exam  Constitutional: He appears well-developed.  HENT:  Head: Normocephalic.  Eyes: Pupils are equal, round, and reactive to light.  Cardiovascular: Normal rate and regular rhythm.   Pulmonary/Chest: Effort normal.  Abdominal: Soft. There is no tenderness.  Musculoskeletal:       Pain with movement of right shoulder. No crepitance. Decreased range of motion right elbow, however patient states this is chronic. Some tenderness to right old. Decreased range of motion right wrist, also. Pain to right hip with palpation. Pain laterally on right knee. Neurovascular intact to her foot. Ankle nontender.  Skin: Skin is warm.    ED Course  Procedures (including  critical care time)  Labs Reviewed - No data to display Dg Shoulder Right  04/19/2012  *RADIOLOGY REPORT*  Clinical Data: Status post fall  RIGHT SHOULDER - 2+ VIEW  Comparison: 10/03/2005  Findings: Again noted is a chronic fracture involving the distal clavicle.  There is mild inferior displacement.  No acute fracture or subluxation identified.  Advanced osteoarthritis involves the glenohumeral joint.  IMPRESSION:  1.  No acute findings. 2.  Chronic distal clavicle fracture with mild inferior displacement. 3.  Osteoarthritis.   Original Report Authenticated By: Signa Kell, M.D.    Dg Elbow Complete Right  04/19/2012  *RADIOLOGY REPORT*  Clinical Data: Post fall, now with elbow pain  RIGHT ELBOW - COMPLETE 3+ VIEW  Comparison: None.  Findings:  The lateral radiographs are markedly degraded secondary to obliquity, limiting evaluation for elbow joint effusion.  There is an accessory ossicle adjacent to the medial epicondyle. No definite fracture or dislocation.  Regional soft tissues are normal.  No radiopaque foreign body.  IMPRESSION: Degraded examination without definite fracture.   Further evaluation with elbow CT may be performed as clinically indicated   Original Report Authenticated By: Tacey Ruiz, MD    Dg Wrist Complete Right  04/19/2012  *RADIOLOGY REPORT*  Clinical Data: Status post fall  RIGHT WRIST - COMPLETE 3+ VIEW  Comparison: None  Findings: Bones appear osteopenic.  No acute fracture or subluxation identified.  No radiopaque foreign bodies or soft tissue calcifications.  IMPRESSION:  1.  No acute findings.   Original Report Authenticated By: Signa Kell, M.D.    Dg Hip Complete Right  04/19/2012  *RADIOLOGY REPORT*  Clinical Data: Post fall, now with right hip pain  RIGHT HIP - COMPLETE 2+ VIEW  Comparison: Pelvic radiographs - 04/23/2006; Hip MRI - 05/03/2006  Findings:  No fracture or dislocation.  Joint spaces are preserved. Degenerative change within the imaged lower  lumbar spine suspected.  Grossly unchanged appearance of chondroid lesion within the intertrochanteric region of the right femur.  Unchanged bone island within the right ilium.  Multiple surgical clips overlying the sacrum.  Regional soft tissues otherwise normal.  IMPRESSION: 1.  No fracture or dislocation. 2.  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   Original Report Authenticated By: Tacey Ruiz, MD    Dg Knee Complete 4 Views Right  04/19/2012  *RADIOLOGY REPORT*  Clinical Data: Post fall, history of knee replacement  RIGHT KNEE - COMPLETE 4+ VIEW  Comparison: None.  Findings:  No fracture or dislocation.  Post right total knee replacement without evidence of hardware failure or loosening.  Fernandez suprapatellar joint effusion.  Regional soft tissues are otherwise normal.  No radiopaque foreign body.  IMPRESSION: 1.  Dombrosky suprapatellar joint effusion without evidence of fracture or dislocation. 2.  Post right total knee replacement without evidence of hardware failure or loosening.   Original Report Authenticated By: Tacey Ruiz, MD      1. Fall   2. Shoulder contusion   3. Elbow contusion   4. Contusion, hip   5. Knee contusion       MDM  Patient with fall. Right sided pain. Doubt intrathoracic or intra-abdominal injury. Lab work does not appear to be needed at this time. X-rays are reassuring overall. Right elbow had some poor quality imaging, and may need to be followed up in his not improved. Patient feels better after pain medicines will be discharged home        Juliet Rude. Rubin Payor, MD 04/19/12 1035

## 2012-06-15 ENCOUNTER — Emergency Department (HOSPITAL_COMMUNITY): Payer: Medicare Other

## 2012-06-15 ENCOUNTER — Emergency Department (HOSPITAL_COMMUNITY)
Admission: EM | Admit: 2012-06-15 | Discharge: 2012-06-16 | Disposition: A | Discharge status: Home / Self Care | Payer: Medicare Other | Attending: Emergency Medicine | Admitting: Emergency Medicine

## 2012-06-15 ENCOUNTER — Encounter (HOSPITAL_COMMUNITY): Payer: Self-pay | Admitting: Neurology

## 2012-06-15 DIAGNOSIS — Y9229 Other specified public building as the place of occurrence of the external cause: Secondary | ICD-10-CM | POA: Insufficient documentation

## 2012-06-15 DIAGNOSIS — G2581 Restless legs syndrome: Secondary | ICD-10-CM | POA: Insufficient documentation

## 2012-06-15 DIAGNOSIS — H538 Other visual disturbances: Secondary | ICD-10-CM | POA: Insufficient documentation

## 2012-06-15 DIAGNOSIS — I10 Essential (primary) hypertension: Secondary | ICD-10-CM | POA: Insufficient documentation

## 2012-06-15 DIAGNOSIS — Z7982 Long term (current) use of aspirin: Secondary | ICD-10-CM | POA: Insufficient documentation

## 2012-06-15 DIAGNOSIS — S79919A Unspecified injury of unspecified hip, initial encounter: Secondary | ICD-10-CM | POA: Insufficient documentation

## 2012-06-15 DIAGNOSIS — Y9301 Activity, walking, marching and hiking: Secondary | ICD-10-CM | POA: Insufficient documentation

## 2012-06-15 DIAGNOSIS — Z79899 Other long term (current) drug therapy: Secondary | ICD-10-CM | POA: Insufficient documentation

## 2012-06-15 DIAGNOSIS — R42 Dizziness and giddiness: Secondary | ICD-10-CM | POA: Insufficient documentation

## 2012-06-15 DIAGNOSIS — W010XXA Fall on same level from slipping, tripping and stumbling without subsequent striking against object, initial encounter: Secondary | ICD-10-CM | POA: Insufficient documentation

## 2012-06-15 DIAGNOSIS — G8190 Hemiplegia, unspecified affecting unspecified side: Secondary | ICD-10-CM | POA: Insufficient documentation

## 2012-06-15 DIAGNOSIS — S060X9A Concussion with loss of consciousness of unspecified duration, initial encounter: Secondary | ICD-10-CM

## 2012-06-15 DIAGNOSIS — IMO0002 Reserved for concepts with insufficient information to code with codable children: Secondary | ICD-10-CM | POA: Insufficient documentation

## 2012-06-15 DIAGNOSIS — T148XXA Other injury of unspecified body region, initial encounter: Secondary | ICD-10-CM

## 2012-06-15 DIAGNOSIS — S060X0A Concussion without loss of consciousness, initial encounter: Secondary | ICD-10-CM | POA: Insufficient documentation

## 2012-06-15 HISTORY — DX: Essential (primary) hypertension: I10

## 2012-06-15 MED ORDER — MORPHINE SULFATE 4 MG/ML IJ SOLN
4.0000 mg | Freq: Once | INTRAMUSCULAR | Status: AC
  Administered 2012-06-15: 4 mg via INTRAVENOUS

## 2012-06-15 MED ORDER — MORPHINE SULFATE 4 MG/ML IJ SOLN
4.0000 mg | Freq: Once | INTRAMUSCULAR | Status: AC
  Administered 2012-06-15: 4 mg via INTRAVENOUS
  Filled 2012-06-15: qty 1

## 2012-06-15 MED ORDER — ONDANSETRON HCL 4 MG/2ML IJ SOLN
4.0000 mg | Freq: Once | INTRAMUSCULAR | Status: AC
  Administered 2012-06-15: 4 mg via INTRAVENOUS
  Filled 2012-06-15: qty 2

## 2012-06-15 NOTE — ED Notes (Signed)
Per ems- Pt was walking with wife in parking lot, she looked over and saw that he had fallen on the ground. May have hit head, wife got him back into car. On the way home pt devloped dizziness , blurred vision, headache. Left hip pain, wife states this is an old problem. Denies LOC, remembers falling. Denies neck or back pain. Main complaint is dizziness and ha. Pupils equal and reactive. 20 left hand. Pt right sided weakness at baseline. Alert, disoriented to time. CBG 103, BP 161/103, HR 72.

## 2012-06-15 NOTE — ED Provider Notes (Signed)
History     CSN: [id number]  Arrival date & time 06/15/12  1851   First MD Initiated Contact with Patient 06/15/12 1910      Chief Complaint  Patient presents with  . Fall    (Consider location/radiation/quality/duration/timing/severity/associated sxs/prior treatment) HPI Comments: Patient comes to the ER for evaluation after a fall. Patient was walking in the parking lot and tripped, falling between his car and another car. Wife said she saw him fall, he fell slowly, trying to get himself from the car. It wasn't a hard fall. There was no loss of consciousness. Patient is complaining of low back pain since the fall. He also feels dizzy. There was no dizziness before the fall, however. Patient denies chest pain, abdominal pain. He is presenting with hip pain, but he has chronic hip pain secondary to bursitis. This has not significantly changed since the fall.  Patient is a 77 y.o. male presenting with fall.  Fall Pertinent negatives include no headaches.    Past Medical History  Diagnosis Date  . Hemiplegia   . Vertigo   . Hypertension   . Restless leg syndrome     Past Surgical History  Procedure Date  . Back surgery   . Total knee arthroplasty   . Hernia repair   . Laminectomy     No family history on file.  History  Substance Use Topics  . Smoking status: Never Smoker   . Smokeless tobacco: Never Used  . Alcohol Use: No      Review of Systems  Respiratory: Negative.   Cardiovascular: Negative.   Musculoskeletal: Positive for back pain and arthralgias.  Neurological: Positive for dizziness. Negative for headaches.  All other systems reviewed and are negative.    Allergies  Penicillins  Home Medications   Current Outpatient Rx  Name  Route  Sig  Dispense  Refill  . ASPIRIN 325 MG PO TABS   Oral   Take 325 mg by mouth daily.         Marland Kitchen CALCIUM + D PO   Oral   Take 1 tablet by mouth daily.          Marland Kitchen VITAMIN D 1000 UNITS PO TABS   Oral   Take  1,000 Units by mouth daily.           Marland Kitchen CLONAZEPAM 1 MG PO TABS   Oral   Take 0.5-1 mg by mouth at bedtime as needed. For sleep.          Marland Kitchen EPINEPHRINE 0.3 MG/0.3ML IJ DEVI   Intramuscular   Inject 0.3 mg into the muscle once.           Marland Kitchen MECLIZINE HCL 25 MG PO TABS   Oral   Take 25 mg by mouth 3 (three) times daily as needed. For dizziness.          . ADULT MULTIVITAMIN W/MINERALS CH   Oral   Take 1 tablet by mouth daily.           Marland Kitchen MIRAPEX PO   Oral   Take 3 tablets by mouth at bedtime.          Marland Kitchen PRAVASTATIN SODIUM PO   Oral   Take 1 tablet by mouth daily.          Marland Kitchen PROPRANOLOL HCL 60 MG PO TABS   Oral   Take 60 mg by mouth as needed.            BP 154/112  Pulse 66  Temp 98.5 F (36.9 C) (Oral)  Resp 18  SpO2 100%  Physical Exam  Constitutional: He is oriented to person, place, and time. He appears well-developed and well-nourished. No distress.  HENT:  Head: Normocephalic and atraumatic.  Right Ear: Hearing normal.  Nose: Nose normal.  Mouth/Throat: Oropharynx is clear and moist and mucous membranes are normal.  Eyes: Conjunctivae normal and EOM are normal. Pupils are equal, round, and reactive to light.  Neck: Normal range of motion. Neck supple.  Cardiovascular: Normal rate, regular rhythm, S1 normal and S2 normal.  Exam reveals no gallop and no friction rub.   No murmur heard. Pulmonary/Chest: Effort normal and breath sounds normal. No respiratory distress. He exhibits no tenderness.  Abdominal: Soft. Normal appearance and bowel sounds are normal. There is no hepatosplenomegaly. There is no tenderness. There is no rebound, no guarding, no tenderness at McBurney's point and negative Murphy's sign. No hernia.  Musculoskeletal: Normal range of motion.       Thoracic back: Normal.       Lumbar back: He exhibits tenderness, pain and spasm. He exhibits no deformity.  Neurological: He is alert and oriented to person, place, and time. He has  normal strength. No cranial nerve deficit or sensory deficit. Coordination normal. GCS eye subscore is 4. GCS verbal subscore is 5. GCS motor subscore is 6.  Skin: Skin is warm, dry and intact. No rash noted. No cyanosis.  Psychiatric: He has a normal mood and affect. His speech is normal and behavior is normal. Thought content normal.    ED Course  Procedures (including critical care time)  Labs Reviewed - No data to display Dg Thoracic Spine 2 View  06/15/2012  *RADIOLOGY REPORT*  Clinical Data: Fall.  Pain.  THORACIC SPINE - 2 VIEW  Comparison: Two-view chest x-ray 02/15/2010.  Findings: Degenerative changes are most evident in the lower cervical spine.  Additional degenerative changes are noted in the lower thoracic spine.  The 12 rib-bearing thoracic type vertebral bodies are present.  Vertebral body heights and alignment maintained.  IMPRESSION:  1.  No acute abnormality. 2.  Degenerative changes at the cervicothoracic junction and at the thoracolumbar junction.   Original Report Authenticated By: Marin Roberts, M.D.    Dg Lumbar Spine Complete  06/15/2012  *RADIOLOGY REPORT*  Clinical Data: Fall.  Pain left buttocks and hip.  LUMBAR SPINE - COMPLETE 4+ VIEW  Comparison: Lumbar spine radiographs 04/23/2006.  Findings: Five non-rib bearing lumbar type vertebral bodies are present.  The vertebral body heights and alignment are maintained. Moderate to endplate degenerative changes are similar to the prior study.  Leftward curvature of the lumbar spine is centered at L3. The visualized pelvis is intact.  IMPRESSION:  1.  Stable moderate spondylosis of the lumbar spine. 2.  No acute abnormality.   Original Report Authenticated By: Marin Roberts, M.D.    Dg Hip Complete Left  06/15/2012  *RADIOLOGY REPORT*  Clinical Data: Fall.  Left hip pain.  LEFT HIP - COMPLETE 2+ VIEW  Comparison: Left hip radiographs 02/21/2006.  Findings: A mixed lytic lesion at the greater trochanter of the right  femur is stable and benign.  The left hip is located.  Mild generative changes are present.  No acute abnormality is present. Pelvic tilt is likely related to the scoliosis.  IMPRESSION:  1.  No acute abnormality. 2.  Stable mixed lytic lesion of the proximal right femur. 3.  Pelvic tilt.   Original Report Authenticated By:  Marin Roberts, M.D.    Ct Head Wo Contrast  06/15/2012  *RADIOLOGY REPORT*  Clinical Data:  Fall in parking lot hitting head.  Hypertension.  CT HEAD WITHOUT CONTRAST CT CERVICAL SPINE WITHOUT CONTRAST  Technique:  Multidetector CT imaging of the head and cervical spine was performed following the standard protocol without intravenous contrast.  Multiplanar CT image reconstructions of the cervical spine were also generated.  Comparison:  06/29/2010 MR.  02/14/2010 head CT.  CT HEAD  Findings: Motion degraded exam.  No obvious skull fracture or intracranial hemorrhage.  Atrophy.  Ventricular prominence without change and may be related to atrophy rather than hydrocephalus.  Lovin vessel disease type changes without CT evidence of large acute infarct.  Orbital structures appear intact.  Mastoid air cells, middle ear cavities and visualized paranasal sinuses are clear.  IMPRESSION: Motion degraded exam.  No obvious skull fracture or intracranial hemorrhage.  CT CERVICAL SPINE  Findings: Levoscoliosis of the cervical spine.  No cervical spine fracture is noted.  Rotation of C1 on C2.  This may be related to head position.  Post traumatic subluxation/rotation not excluded in the proper clinical setting.  Cervical spondylotic changes with various degrees of spinal stenosis and foraminal narrowing.  Enlarged thyroid gland with substernal extension.  This can be followed with thyroid ultrasound.  No lung apical pneumothorax.  Vascular calcifications.  IMPRESSION: Levoscoliosis of the cervical spine.  No cervical spine fracture is noted.  Rotation of C1 on C2.  This may be related to head  position.  Post traumatic subluxation/rotation not excluded in the proper clinical setting.  Cervical spondylotic changes with various degrees of spinal stenosis and foraminal narrowing.  Enlarged thyroid gland with substernal extension.  This can be followed with thyroid ultrasound.  Results discussed with Dr. Blinda Leatherwood  06/15/2012 9:00 p.m.   Original Report Authenticated By: Lacy Duverney, M.D.    Ct Cervical Spine Wo Contrast  06/15/2012  *RADIOLOGY REPORT*  Clinical Data:  Fall in parking lot hitting head.  Hypertension.  CT HEAD WITHOUT CONTRAST CT CERVICAL SPINE WITHOUT CONTRAST  Technique:  Multidetector CT imaging of the head and cervical spine was performed following the standard protocol without intravenous contrast.  Multiplanar CT image reconstructions of the cervical spine were also generated.  Comparison:  06/29/2010 MR.  02/14/2010 head CT.  CT HEAD  Findings: Motion degraded exam.  No obvious skull fracture or intracranial hemorrhage.  Atrophy.  Ventricular prominence without change and may be related to atrophy rather than hydrocephalus.  Belay vessel disease type changes without CT evidence of large acute infarct.  Orbital structures appear intact.  Mastoid air cells, middle ear cavities and visualized paranasal sinuses are clear.  IMPRESSION: Motion degraded exam.  No obvious skull fracture or intracranial hemorrhage.  CT CERVICAL SPINE  Findings: Levoscoliosis of the cervical spine.  No cervical spine fracture is noted.  Rotation of C1 on C2.  This may be related to head position.  Post traumatic subluxation/rotation not excluded in the proper clinical setting.  Cervical spondylotic changes with various degrees of spinal stenosis and foraminal narrowing.  Enlarged thyroid gland with substernal extension.  This can be followed with thyroid ultrasound.  No lung apical pneumothorax.  Vascular calcifications.  IMPRESSION: Levoscoliosis of the cervical spine.  No cervical spine fracture is noted.   Rotation of C1 on C2.  This may be related to head position.  Post traumatic subluxation/rotation not excluded in the proper clinical setting.  Cervical spondylotic changes with various degrees  of spinal stenosis and foraminal narrowing.  Enlarged thyroid gland with substernal extension.  This can be followed with thyroid ultrasound.  Results discussed with Dr. Blinda Leatherwood  06/15/2012 9:00 p.m.   Original Report Authenticated By: Lacy Duverney, M.D.    Ct C Spine Ltd Wo Or W Cm  06/16/2012  **ADDENDUM** CREATED: 06/16/2012 00:04:13  Upon reviewing the patient's prior imaging studies, there are 2 prior MR is of the brain (06/29/2010 and 05/31/2005).  When the present CT scan is compared to the sagittal imaging of the prior MRIs, the rotation of C1 upon C2 is unchanged and therefore chronic.  This was discussed with Dr. Blinda Leatherwood.  **END ADDENDUM** SIGNED BY: Almedia Balls. Constance Goltz, M.D.   06/15/2012  *RADIOLOGY REPORT*  Clinical Data: Fall. Rotation noted on initial CT.  CT CERVICAL SPINE WITHOUT CONTRAST  Technique:  Multidetector CT imaging of the cervical spine was performed. Multiplanar CT image reconstructions were also generated.  Comparison: 06/15/2012  Findings: The head is straightened in the scanner.  Rotation of the C1 with respect to the occipital condyle and C2.  This rotatory subluxation is superimposed upon levoscoliosis of the remainder of the cervical spine.  Age of this is indeterminate although may be related to recent trauma.  No associated fracture.  IMPRESSION: The head is straightened in the scanner.  Rotation of the C1 with respect to the occipital condyle and C2. This rotatory subluxation is superimposed upon levoscoliosis of the remainder of the cervical spine.  Age of this is indeterminate although may be related to recent trauma.  No associated fracture.   Original Report Authenticated By: Lacy Duverney, M.D.      Diagnosis: Contusion; concussion    MDM  He presents to the ER after a fall.  Patient had double and blurred vision and headache initially, but this has cleared. This is consistent with minor concussion. Thoracic spine, lumbar spine and hip x-ray were negative. CT head without show any acute injury. CT cervical spine showed rotation of C1 on C2 and was felt that this could be subluxation possible ligamentous injury. I did discuss this with Dr. Constance Goltz, the radiologist. He was able to find previous MRI films that showed that this was unchanged from previous and therefore patient is to be discharged to home.        Gilda Crease, MD 06/16/12 1055

## 2012-06-15 NOTE — ED Notes (Signed)
Pt arrived via [facility] on [facility] with c-collar intact; family at bedside

## 2012-06-16 MED ORDER — HYDROCODONE-ACETAMINOPHEN 5-325 MG PO TABS: 2.0000 | ORAL_TABLET | ORAL | Status: DC | PRN

## 2012-06-16 NOTE — ED Notes (Signed)
Pt given coffee and crackers. Pt got up and walked around the room

## 2012-07-09 HISTORY — PX: MULTIPLE TOOTH EXTRACTIONS: SHX2053

## 2012-07-26 ENCOUNTER — Telehealth: Payer: Self-pay | Admitting: Neurology

## 2012-07-26 MED ORDER — BACLOFEN 20 MG PO TABS: 20.0000 mg | ORAL_TABLET | Freq: Every day | ORAL | Status: DC

## 2012-07-26 NOTE — Telephone Encounter (Signed)
Wife calling to relay that pt is on pramipexole 1 mg po qhs now and it is not working.   Pt continues with clonus L leg, which is worsening.  Increase dose or change med.   Pt and wife are not able to sleep.  Please advise.

## 2012-07-26 NOTE — Telephone Encounter (Signed)
I called the wife. The patient has a right hemiparesis, and he is having spasms involving the right leg at nighttime. In the past, he was taking baclofen 10 mg at night. The patient never went on a higher dose. The patient is not getting benefit from Mirapex, and he remains on clonazepam taking 1 mg at night. The patient needs to be back on baclofen, and I'll start 10 mg at night for one week, and then go to 20 mg at night. They are to contact her office in several weeks if this is not effective.

## 2012-10-12 ENCOUNTER — Other Ambulatory Visit: Payer: Self-pay | Admitting: Neurology

## 2012-10-16 ENCOUNTER — Other Ambulatory Visit: Payer: Self-pay | Admitting: Neurology

## 2012-10-17 ENCOUNTER — Other Ambulatory Visit: Payer: Self-pay | Admitting: Neurology

## 2012-10-17 MED ORDER — CLONAZEPAM 1 MG PO TABS: 0.5000 mg | ORAL_TABLET | Freq: Every evening | ORAL | Status: DC | PRN

## 2012-10-21 ENCOUNTER — Other Ambulatory Visit: Payer: Self-pay | Admitting: Dermatology

## 2012-11-04 ENCOUNTER — Telehealth: Payer: Self-pay | Admitting: *Deleted

## 2012-11-07 ENCOUNTER — Telehealth: Payer: Self-pay | Admitting: Neurology

## 2012-11-07 NOTE — Telephone Encounter (Signed)
i found ths message this AM and called patient twice , nobody picked up. I will ask to have him worked in for the next week , if possible .

## 2012-11-07 NOTE — Telephone Encounter (Signed)
Please try to reach this patient and schedule a RV .Marland Kitchen

## 2012-11-07 NOTE — Telephone Encounter (Signed)
Spoke to spouse. Scheduled OV per Dr. Valetta Mole assistant.

## 2012-11-07 NOTE — Telephone Encounter (Addendum)
Spoke to spouse.  Will check with Dr. Vickey Huger assistant to see when patient should be worked in next week.

## 2012-11-09 ENCOUNTER — Ambulatory Visit: Payer: Self-pay | Admitting: Neurology

## 2012-11-09 ENCOUNTER — Telehealth: Payer: Self-pay | Admitting: Neurology

## 2012-11-09 NOTE — Telephone Encounter (Signed)
Please get appoinmtent for ASAP first available.

## 2012-11-23 NOTE — Telephone Encounter (Signed)
Patient sched. 11/30/12.

## 2012-11-29 ENCOUNTER — Encounter: Payer: Self-pay | Admitting: Neurology

## 2012-11-30 ENCOUNTER — Encounter: Payer: Self-pay | Admitting: Neurology

## 2012-11-30 ENCOUNTER — Other Ambulatory Visit: Payer: Self-pay | Admitting: Neurology

## 2012-11-30 ENCOUNTER — Ambulatory Visit (INDEPENDENT_AMBULATORY_CARE_PROVIDER_SITE_OTHER): Payer: Medicare Other | Admitting: Neurology

## 2012-11-30 VITALS — BP 125/82 | HR 78 | Resp 16

## 2012-11-30 DIAGNOSIS — R4781 Slurred speech: Secondary | ICD-10-CM

## 2012-11-30 DIAGNOSIS — M48061 Spinal stenosis, lumbar region without neurogenic claudication: Secondary | ICD-10-CM

## 2012-11-30 DIAGNOSIS — G8190 Hemiplegia, unspecified affecting unspecified side: Secondary | ICD-10-CM

## 2012-11-30 DIAGNOSIS — R4789 Other speech disturbances: Secondary | ICD-10-CM

## 2012-11-30 NOTE — Patient Instructions (Signed)
Tremor Tremor is a rhythmic, involuntary muscular contraction characterized by oscillations (to-and-fro movements) of a part of the body. The most common of all involuntary movements, tremor can affect various body parts such as the hands, head, facial structures, vocal cords, trunk, and legs; most tremors, however, occur in the hands. Tremor often accompanies neurological disorders associated with aging. Although the disorder is not life-threatening, it can be responsible for functional disability and social embarrassment. TREATMENT  There are many types of tremor and several ways in which tremor is classified. The most common classification is by behavioral context or position. There are five categories of tremor within this classification: resting, postural, kinetic, task-specific, and psychogenic. Resting or static tremor occurs when the muscle is at rest, for example when the hands are lying on the lap. This type of tremor is often seen in patients with Parkinson's disease. Postural tremor occurs when a patient attempts to maintain posture, such as holding the hands outstretched. Postural tremors include physiological tremor, essential tremor, tremor with basal ganglia disease (also seen in patients with Parkinson's disease), cerebellar postural tremor, tremor with peripheral neuropathy, post-traumatic tremor, and alcoholic tremor. Kinetic or intention (action) tremor occurs during purposeful movement, for example during finger-to-nose testing. Task-specific tremor appears when performing goal-oriented tasks such as handwriting, speaking, or standing. This group consists of primary writing tremor, vocal tremor, and orthostatic tremor. Psychogenic tremor occurs in both older and younger patients. The key feature of this tremor is that it dramatically lessens or disappears when the patient is distracted. PROGNOSIS There are some treatment options available for tremor; the appropriate treatment depends on  accurate diagnosis of the cause. Some tremors respond to treatment of the underlying condition, for example in some cases of psychogenic tremor treating the patient's underlying mental problem may cause the tremor to disappear. Also, patients with tremor due to Parkinson's disease may be treated with Levodopa drug therapy. Symptomatic drug therapy is available for several other tremors as well. For those cases of tremor in which there is no effective drug treatment, physical measures such as teaching the patient to brace the affected limb during the tremor are sometimes useful. Surgical intervention such as thalamotomy or deep brain stimulation may be useful in certain cases. Document Released: 04/17/2002 Document Revised: 07/20/2011 Document Reviewed: 04/27/2005 [facility] Patient Information 2014 [place], [place]. Spinal Stenosis Spinal stenosis means the space around your spinal cord and nerve roots is too narrow. The bones around your spinal cord pinch your spinal cord nerves. This pinching causes pain in the back and legs. Your doctor might give you medicines to help. In some cases, surgery is needed. HOME CARE  Do exercises and stretches as told by your doctor. Lean forward while walking. Lie down and bring your knees up to your chest.  Rest, and then slowly go back to your activity.  Do aerobic activities, such as walking, biking, and swimming, as told by your doctor. Try to increase your amount of activity over time.  Lose weight if your doctor recommends it. This reduces the load on your spine.  Apply warm or cold packs for pain as told by your doctor. GET HELP RIGHT AWAY IF:  Your pain comes back more and more often.  You have pain that goes down your leg, even when you are not standing or walking.  You cannot control when you pee (urinate) or poop (bowel movement).  You lose feeling (numbness) in your legs and it does not come back.  You cannot move your legs.  MAKE SURE  YOU:  Understand these instructions.  Will watch your condition.  Will get help right away if you are not doing well or get worse. Document Released: 08/21/2010 Document Revised: 07/20/2011 Document Reviewed: 08/21/2010 [facility] Patient Information 2014 [place], [place].
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
## 2012-12-05 ENCOUNTER — Telehealth: Payer: Self-pay | Admitting: Neurology

## 2012-12-06 LAB — BASIC METABOLIC PANEL
Calcium: 9.2 mg/dL (ref 8.6–10.2)
Chloride: 103 mmol/L (ref 96–108)
GFR calc Af Amer: 106 mL/min/{1.73_m2} (ref >59)
GFR calc non Af Amer: 92 mL/min/{1.73_m2} (ref >59)
Glucose: 100 mg/dL — ABNORMAL HIGH (ref 65–99)
Potassium: 4.6 mmol/L (ref 3.5–5.2)
Sodium: 137 mmol/L (ref 134–144)

## 2012-12-06 NOTE — Telephone Encounter (Signed)
Lab results received from [facility] and successfully faxed to [facility] at 12:10 PM.

## 2012-12-06 NOTE — Telephone Encounter (Signed)
[facility] computer down all of last week (w/e 7/25).  Spoke with Arlene Research scientist (physical sciences)) and waiting on results.

## 2012-12-09 ENCOUNTER — Encounter (HOSPITAL_COMMUNITY): Payer: Self-pay | Admitting: Emergency Medicine

## 2012-12-09 ENCOUNTER — Emergency Department (HOSPITAL_COMMUNITY)
Admission: EM | Admit: 2012-12-09 | Discharge: 2012-12-09 | Disposition: A | Discharge status: Home / Self Care | Payer: Medicare Other | Attending: Emergency Medicine | Admitting: Emergency Medicine

## 2012-12-09 ENCOUNTER — Telehealth (HOSPITAL_COMMUNITY): Payer: Self-pay | Admitting: Emergency Medicine

## 2012-12-09 ENCOUNTER — Telehealth: Payer: Self-pay | Admitting: *Deleted

## 2012-12-09 ENCOUNTER — Emergency Department (HOSPITAL_COMMUNITY): Payer: Medicare Other

## 2012-12-09 DIAGNOSIS — Z8669 Personal history of other diseases of the nervous system and sense organs: Secondary | ICD-10-CM | POA: Insufficient documentation

## 2012-12-09 DIAGNOSIS — Z79899 Other long term (current) drug therapy: Secondary | ICD-10-CM | POA: Insufficient documentation

## 2012-12-09 DIAGNOSIS — F411 Generalized anxiety disorder: Secondary | ICD-10-CM | POA: Insufficient documentation

## 2012-12-09 DIAGNOSIS — R531 Weakness: Secondary | ICD-10-CM

## 2012-12-09 DIAGNOSIS — Z880 Allergy status to penicillin: Secondary | ICD-10-CM | POA: Insufficient documentation

## 2012-12-09 DIAGNOSIS — R471 Dysarthria and anarthria: Secondary | ICD-10-CM | POA: Insufficient documentation

## 2012-12-09 DIAGNOSIS — Z7982 Long term (current) use of aspirin: Secondary | ICD-10-CM | POA: Insufficient documentation

## 2012-12-09 DIAGNOSIS — G2581 Restless legs syndrome: Secondary | ICD-10-CM | POA: Insufficient documentation

## 2012-12-09 DIAGNOSIS — R5381 Other malaise: Secondary | ICD-10-CM | POA: Insufficient documentation

## 2012-12-09 DIAGNOSIS — I10 Essential (primary) hypertension: Secondary | ICD-10-CM | POA: Insufficient documentation

## 2012-12-09 DIAGNOSIS — R5383 Other fatigue: Secondary | ICD-10-CM | POA: Insufficient documentation

## 2012-12-09 DIAGNOSIS — Z87820 Personal history of traumatic brain injury: Secondary | ICD-10-CM | POA: Insufficient documentation

## 2012-12-09 DIAGNOSIS — F0390 Unspecified dementia without behavioral disturbance: Secondary | ICD-10-CM | POA: Insufficient documentation

## 2012-12-09 DIAGNOSIS — E7800 Pure hypercholesterolemia, unspecified: Secondary | ICD-10-CM | POA: Insufficient documentation

## 2012-12-09 DIAGNOSIS — Z8659 Personal history of other mental and behavioral disorders: Secondary | ICD-10-CM | POA: Insufficient documentation

## 2012-12-09 LAB — DIFFERENTIAL
Basophils Absolute: 0 10*3/uL (ref 0.0–0.1)
Eosinophils Relative: 1 % (ref 0–5)
Lymphocytes Relative: 15 % (ref 12–46)
Lymphs Abs: 1.3 10*3/uL (ref 0.7–4.0)
Monocytes Absolute: 0.6 10*3/uL (ref 0.1–1.0)
Monocytes Relative: 7 % (ref 3–12)
Neutro Abs: 6.6 10*3/uL (ref 1.7–7.7)

## 2012-12-09 LAB — RAPID URINE DRUG SCREEN, HOSP PERFORMED
Barbiturates: NOT DETECTED
Opiates: NOT DETECTED
Tetrahydrocannabinol: NOT DETECTED

## 2012-12-09 LAB — CBC
HCT: 41 % (ref 39.0–52.0)
Hemoglobin: 13.5 g/dL (ref 13.0–17.0)
MCV: 79.9 fL (ref 78.0–100.0)
RBC: 5.13 MIL/uL (ref 4.22–5.81)
WBC: 8.6 10*3/uL (ref 4.0–10.5)

## 2012-12-09 LAB — APTT: aPTT: 28 seconds (ref 24–37)

## 2012-12-09 LAB — URINE MICROSCOPIC-ADD ON

## 2012-12-09 LAB — URINALYSIS, ROUTINE W REFLEX MICROSCOPIC
Hgb urine dipstick: NEGATIVE
Nitrite: POSITIVE — AB
Specific Gravity, Urine: 1.009 (ref 1.005–1.030)
Urobilinogen, UA: 0.2 mg/dL (ref 0.0–1.0)
pH: 7 (ref 5.0–8.0)

## 2012-12-09 LAB — COMPREHENSIVE METABOLIC PANEL
AST: 13 U/L (ref 0–37)
CO2: 26 mEq/L (ref 19–32)
Calcium: 8.7 mg/dL (ref 8.4–10.5)
Chloride: 109 mEq/L (ref 96–112)
Creatinine, Ser: 0.75 mg/dL (ref 0.50–1.35)
GFR calc Af Amer: >90 mL/min (ref >90)
GFR calc non Af Amer: 88 mL/min — ABNORMAL LOW (ref >90)
Glucose, Bld: 100 mg/dL — ABNORMAL HIGH (ref 70–99)
Total Bilirubin: 0.2 mg/dL — ABNORMAL LOW (ref 0.3–1.2)

## 2012-12-09 LAB — ETHANOL: Alcohol, Ethyl (B): <11 mg/dL (ref 0–11)

## 2012-12-09 NOTE — Telephone Encounter (Signed)
Called prescription to [facility] on [address] for keflex 1000mg  po twice a day for 7 days. Written by Dr Wayland Salinas. Pt notified about medication.

## 2012-12-09 NOTE — ED Notes (Signed)
Pt has hx of TBI- right sided paralysis from injury.  Pt has had increased weakness to his left side and slurred speech that comes and goes for the past 2 weeks.  Pt saw neurologist earlier this week and has an appointment for outpatient MRI on Tuesday of next week.  Pt called neurologist this morning and was instructed to come to ED for further evaluation.

## 2012-12-09 NOTE — Telephone Encounter (Signed)
Jetta Lout, NP from [facility] calling re: pt having worsening R sided sx (facial droop, slurred speech and gait).  Was there for voiding trial, and wife stated to them that pt was seen here for memory RV on 11-30-12.   He does have hx of R hemiparesis due to traumatic brain injury 40 yrs ago.   MRI scheduled for this Tuesday.   Consulted Dr. Vickey Huger, recommended for pt to go to [facility] for evaluation.   I relayed to Diane and she will inform wife to take pt to ED.  I called and spoke to Shanda Bumps, RN at [facility] that pt is coming.

## 2012-12-09 NOTE — ED Provider Notes (Signed)
CSN: [id number]     Arrival date & time 12/09/12  1032 History     First MD Initiated Contact with Patient 12/09/12 1157     Chief Complaint  Patient presents with  . 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, he also has a history of vertigo and dementia both stable, at baseline a few weeks ago he was able to walk with a cane, now for the last 2 weeks he has had mild dysarthria which waxes and wanes but does not resolve as well as new onset left arm and leg weakness with inability to walk safely and essentially has been wheelchair-bound for the last 2 weeks, he is a history of generalized weakness and multiple falls in the past but has not fallen injury at all in the last 2 weeks, he is no trauma fever change in mental status chest pain palpitation shortness breath abdominal pain vomiting bloody stools or other concerns.There is no treatment prior to arrival other than using a wheelchair instead of his cane at home too unsafe feeling due to inability to walk over the last 2 weeks. Past Medical History  Diagnosis Date  . Hemiplegia   . Vertigo   . Hypertension   . Restless leg syndrome   . TBI (traumatic brain injury)     WITH LEFT HEMISPHERIC BLEED  . Depression with anxiety   . High cholesterol   . Chronic pain     lower back  . Dementia   . Tremor     familiar  . Swelling of upper lip     tongue   Past Surgical History  Procedure Laterality Date  . Back surgery    . Total knee arthroplasty    . Hernia repair  1985  . Laminectomy  2006  . Colon resection  1996  . Dental surgery  3/14    all upper teeth removed   Family History  Problem Relation Age of Onset  . Tremor Maternal Grandfather    History  Substance Use Topics  . Smoking status: Never Smoker   .  Smokeless tobacco: Never Used     Comment: quit in 1965  . Alcohol Use: No    Review of Systems 10 Systems reviewed and are negative for acute change except as noted in the HPI. Allergies  Ace inhibitors and Penicillins  Home Medications   Current Outpatient Rx  Name  Route  Sig  Dispense  Refill  . ascorbic acid (VITAMIN C) 1000 MG tablet   Oral   Take 1,000 mg by mouth daily.         Marland Kitchen aspirin 325 MG tablet   Oral   Take 325 mg by mouth daily.         . baclofen (LIORESAL) 20 MG tablet   Oral   Take 1 tablet (20 mg total) by mouth at bedtime.   30 each   3     Start taking one half tablet at night for 1 week,  ...   . Calcium Carbonate-Vitamin D (CALCIUM + D PO)   Oral   Take 1 tablet by mouth daily.          . cholecalciferol (VITAMIN D) 1000 UNITS tablet   Oral   Take 1,000 Units by mouth daily.           . clonazePAM (  KLONOPIN) 1 MG tablet   Oral   Take 0.5-1 tablets (0.5-1 mg total) by mouth at bedtime as needed.   30 tablet   5     Pharmacy Fax [phone]   . diclofenac sodium (VOLTAREN) 1 % GEL   Topical   Apply 2 g topically 4 (four) times daily as needed (Pain).         Marland Kitchen EPINEPHrine (EPIPEN) 0.3 mg/0.3 mL DEVI   Intramuscular   Inject 0.3 mg into the muscle once. As needed         . HYDROcodone-acetaminophen (NORCO/VICODIN) 5-325 MG per tablet   Oral   Take 1 tablet by mouth every 6 (six) hours as needed for pain.         Marland Kitchen lidocaine (LIDODERM) 5 %   Transdermal   Place 1 patch onto the skin daily as needed (Back pain). Remove & Discard patch within 12 hours or as directed by MD         . meclizine (ANTIVERT) 25 MG tablet   Oral   Take 25 mg by mouth 3 (three) times daily as needed for dizziness.          . Multiple Vitamin (MULITIVITAMIN WITH MINERALS) TABS   Oral   Take 1 tablet by mouth daily.           . pravastatin (PRAVACHOL) 40 MG tablet   Oral   Take 40 mg by mouth daily with breakfast.         .  propranolol (INDERAL) 60 MG tablet   Oral   Take 60 mg by mouth daily as needed (Parkinson's).          . tamsulosin (FLOMAX) 0.4 MG CAPS   Oral   Take 0.8 mg by mouth daily.         Marland Kitchen triamcinolone cream (KENALOG) 0.1 %   Topical   Apply 1 application topically daily as needed (Itching).          BP 156/82  Pulse 55  Temp(Src) 98 F (36.7 C) (Oral)  Resp 16  SpO2 98% Physical Exam  Nursing note and vitals reviewed. Constitutional:  Awake, alert, nontoxic appearance.  HENT:  Head: Atraumatic.  Eyes: Right eye exhibits no discharge. Left eye exhibits no discharge.  Left eye lateral rectus palsy chronic  Neck: Neck supple.  Cardiovascular: Normal rate and regular rhythm.   No murmur heard. Pulmonary/Chest: Effort normal and breath sounds normal. No respiratory distress. He has no wheezes. He has no rales. He exhibits no tenderness.  Abdominal: Soft. There is no tenderness. There is no rebound.  Musculoskeletal: He exhibits no edema and no tenderness.  Baseline ROM, no obvious tenderness.  Neurological: He is alert.  Mental status and right-sided motor strength appears baseline for patient and situation. Baseline right hemiparesis baseline right facial droop baseline lateral rectus nerve palsy left eye normal light touch all 4 extremities 4/5 strength left arm and left leg which patient reports is new patient also has mild dysarthria but midline tongue.  Skin: No rash noted.  Psychiatric: He has a normal mood and affect.    ED Course  ECG: Normal sinus rhythm, ventricular rate 63, normal axis, normal intervals, no acute ischemic changes noted, no comparison ECG immediately available   D/w Dr. Vickey Huger for OutPt f/u.Patient / Family / Caregiver informed of clinical course, understand medical decision-making process, and agree with plan. Procedures (including critical care time)  Labs Reviewed  CBC - Abnormal; Notable for the  following:    RDW 16.4 (*)    All other  components within normal limits  COMPREHENSIVE METABOLIC PANEL - Abnormal; Notable for the following:    Glucose, Bld 100 (*)    Total Protein 5.6 (*)    Albumin 3.0 (*)    Total Bilirubin 0.2 (*)    GFR calc non Af Amer 88 (*)    All other components within normal limits  URINALYSIS, ROUTINE W REFLEX MICROSCOPIC - Abnormal; Notable for the following:    Nitrite POSITIVE (*)    Leukocytes, UA LARGE (*)    All other components within normal limits  ETHANOL  PROTIME-INR  APTT  DIFFERENTIAL  TROPONIN I  URINE RAPID DRUG SCREEN (HOSP PERFORMED)  URINE MICROSCOPIC-ADD ON   Mr Brain Wo Contrast  12/09/2012   *RADIOLOGY REPORT*  Clinical Data: Right-sided facial droop.  Slurred speech.  A left- sided weakness.  Previous closed head injury.  MRI HEAD WITHOUT CONTRAST  Technique:  Multiplanar, multiecho pulse sequences of the brain and surrounding structures were obtained according to standard protocol without intravenous contrast.  Comparison: Head CT 06/15/2012.  Brain MRI 06/29/2010.  Findings: Diffusion imaging does not show any acute or subacute infarction.  The brainstem shows Wallerian degeneration on the left but no primary brain stem insult.  The cerebellum shows generalized atrophy without a focal stroke.  The cerebral hemispheres show old infarction in the left thalamus and radiating white matter tracts. There is hemosiderin deposition in that region related to old hemorrhage.  There are chronic Ruda vessel ischemic changes within the hemispheric deep white matter.  There is an old right frontal cortical and subcortical infarction.  No mass lesion, acute hemorrhage, hydrocephalus or extra-axial collection.  Ventricles are prominent on the basis of ex vacuo enlargement.  No pituitary mass.  No inflammatory sinus disease.  No skull or skull base lesion.  IMPRESSION: No acute finding.  Old left thalamic and deep white matter infarction with hemosiderin deposition.  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  Old right frontal cortical and subcortical infarction.  Generalized brain atrophy.   Original Report Authenticated By: Paulina Fusi, M.D.   1. Left-sided weakness     MDM  I doubt any other [facility] precluding discharge at this time including, but not necessarily limited to the following:acute CVA.  Addendum: U/A result possible UTI noted after discharge so d/w Flow Mgr will call Pt (I prescribed Keflex 1000mg  po bid x 7 days). 1900  Hurman Horn, MD 12/09/12 [phone]

## 2012-12-09 NOTE — ED Notes (Signed)
Patient transported to MRI 

## 2012-12-09 NOTE — ED Notes (Signed)
Pt sent here by PCP for increasing right sided weakness x 2 weeks; pt sts some issues with speech x 2 weeks also; pt with hx of residual right sided weakness from TBI and told to come for eval by neurologist

## 2012-12-09 NOTE — Progress Notes (Signed)
Quick Note:  Normal Labs - please note that patient underwent an MRI brain today , returning as negative for acute stroke. ______

## 2012-12-12 ENCOUNTER — Telehealth: Payer: Self-pay | Admitting: Neurology

## 2012-12-12 NOTE — Telephone Encounter (Signed)
This pt had MRI brain on Friday when went to ER for increased sx (facial drooping,  .  Requesting Dr. Vickey Huger to return call.   Having C spine MRI tomorrow ( I see scheduled).

## 2012-12-13 ENCOUNTER — Telehealth: Payer: Self-pay | Admitting: Neurology

## 2012-12-13 ENCOUNTER — Inpatient Hospital Stay: Admission: RE | Admit: 2012-12-13 | Payer: Medicare Other | Source: Ambulatory Visit

## 2012-12-13 NOTE — Telephone Encounter (Signed)
Dr Lestine Box called from the ED and stated the MRI showed no fresh /acute stroke changes. cevical spine still pending. Will need follow up on thursday or Friday .

## 2012-12-14 ENCOUNTER — Telehealth: Payer: Self-pay

## 2012-12-14 ENCOUNTER — Telehealth: Payer: Self-pay | Admitting: Neurology

## 2012-12-14 NOTE — Telephone Encounter (Addendum)
Message copied by [facility] on Wed Dec 14, 2012  3:55 PM ------      Message from: [facility], [place]      Created: Fri Dec 09, 2012  4:10 PM       Normal  Labs - please note that patient underwent an MRI brain today , returning as negative for acute stroke. ------ I spoke with patient's wife. She had understood that MRI of spine was to be today but it is scheduled in the system for yesterday, so they missed it. It has been rescheduled for next Tuesday, December 20, 2012. She would like for her husband to be scheduled earlier than December because his symtotms have significantly diminished since he was here 10 days ago. I let wife know Dr. Vickey Huger may want to wait until after next MRI to decide when patient should return but, I will forward the information.

## 2012-12-14 NOTE — Telephone Encounter (Signed)
Last patient next Thurday before lunch?

## 2012-12-15 NOTE — Progress Notes (Signed)
[facility] ED CM received a voice message from Briscoe Burns stating [name] needed assistance with home health services.   CM entered in MRN provided which indicates this pt not [name], the spouse. Cm reviewed EPIC notes for 12/09/12 encounter but unable to find orders or mention of home health services CM spoke with spouse, [name] when dialed [phone]. Reviewed concern with spouse who states she was informed home health services would be started for pt on "saturday" CM assessed pt needs, home bound condition and primary caregiver Confirmed pcp is [name].  CM reviewed in details medicare guidelines, home health [facility]) (length of stay in home, types of [facility] staff available, coverage, primary caregiver, up to 24 hrs before services may be started) and Private duty nursing (PDN-coverage, length of stay in the home types of staff available). Wife states pt does not need PDN services. She discussed need for [facility] and PT Reports she also has injury  CM contacted Dr Jacky Kindle to review spouse concern and informed to return call to spouse to notify her that his office would contact her  CM spoke with wife and she voiced understanding

## 2012-12-16 ENCOUNTER — Telehealth: Payer: Self-pay | Admitting: Neurology

## 2012-12-19 NOTE — Telephone Encounter (Signed)
I see appt 12-22-12 at 1130 with Dr. Vickey Huger already made.

## 2012-12-20 ENCOUNTER — Ambulatory Visit
Admission: RE | Admit: 2012-12-20 | Discharge: 2012-12-20 | Disposition: A | Discharge status: Home / Self Care | Payer: Medicare Other | Source: Ambulatory Visit | Attending: Neurology | Admitting: Neurology

## 2012-12-20 DIAGNOSIS — M48061 Spinal stenosis, lumbar region without neurogenic claudication: Secondary | ICD-10-CM

## 2012-12-20 DIAGNOSIS — G8190 Hemiplegia, unspecified affecting unspecified side: Secondary | ICD-10-CM

## 2012-12-20 DIAGNOSIS — R4781 Slurred speech: Secondary | ICD-10-CM

## 2012-12-20 MED ORDER — GADOBENATE DIMEGLUMINE 529 MG/ML IV SOLN
15.0000 mL | Freq: Once | INTRAVENOUS | Status: AC | PRN
  Administered 2012-12-20: 15 mL via INTRAVENOUS

## 2012-12-22 ENCOUNTER — Ambulatory Visit (INDEPENDENT_AMBULATORY_CARE_PROVIDER_SITE_OTHER): Payer: Medicare Other | Admitting: Neurology

## 2012-12-22 ENCOUNTER — Encounter: Payer: Self-pay | Admitting: Neurology

## 2012-12-22 ENCOUNTER — Ambulatory Visit: Payer: Medicare Other | Admitting: Neurology

## 2012-12-22 VITALS — BP 141/88 | HR 80

## 2012-12-22 DIAGNOSIS — F0390 Unspecified dementia without behavioral disturbance: Secondary | ICD-10-CM

## 2012-12-22 DIAGNOSIS — M48061 Spinal stenosis, lumbar region without neurogenic claudication: Secondary | ICD-10-CM

## 2012-12-22 DIAGNOSIS — R531 Weakness: Secondary | ICD-10-CM | POA: Insufficient documentation

## 2012-12-22 HISTORY — DX: Weakness: R53.1

## 2012-12-22 NOTE — Progress Notes (Signed)
[name] returns today after a brief hospitalization and [facility]. As described in his last visit from 11-30-2012 the patient had a traumatic brain injury over 40 years ago he also had degenerative disc disease and had back surgery. He has had progressive memory difficulties but the chief complaint today as periodic slurring of speech, facial droop and, and weakness. Amulation is so poor that even his wife and an assistive device cannot keep him from falling.  During his ER visit an MRI of the brain was obtained and showed no acute stroke.   In his last visit his MMSE  was 27 points and his AFP 13 points.  My assistant repeated the  MMSE test today and the patient reached 14 points. AfT 14 points as well. The patient's wife reported that she went for barely a minute or 2 outside to get the mail , in this short interval her husband tried to leave the wheelchair and fell again- and this has happened almost daily. Unable to retain the information for safety.  The couple has purchased a wheelchair and has built a ramp at the entry to the home.  [name], [name],  has meanwhile arranged for occupational, physical and speech therapy  At home-  The recent imaging studies were presented and discussed today and [name] will get written copies of the reports.   The brain study showed microvascular disease, generalized atrophy  and old thalamic injuries that fit the history of the old injury 40 years ago - and cerebellar atrophy.  Given that the patient had gait instability  pre-existing, the cerebellar atrophy indicates an additional degenerative process, and could  possibly indicate a seperate disorder , not wallerian degeneration, genetic predisposition. He is pain free.  Some of these these disorders are accompanied by  dementia . The patient does not have a history of alcohol abuse or long-standing treatment with Tegretol  or Dilantin which are known to decrease the cerebellar size .  Given the recent score is on MM SE testing - this would be diagnostic for a dementia.  In addition his gait abnormality is explained by his severely progressed spinal stenosis from the thoracic vertebrae level 10  down to sacral level, with severe foraminal stenosis on multiple levels, as well as spinal canal stenosis on multiple levels  [name] has rub or dysarthric speech today but he also recently underwent a dental procedure and had his upper teeth pulled, this time does not show fasciculation. There is no jaw tremor nor did and no facial tics. Has atrophy of the lack muscles in his left and right leg which would be sufficiently explained with its spinal stenosis condition. Has very little muscle mass left on his extremities in general. Urination and bladder control have not been affected [name] is 6 had his blink reflex is repeated twice but is not willing, there is no Meyerson sign.    Assessment and Plan:  I agree this all key, ST, PT at home the main goal is fall prevention to prevent further injuries.  #2 I know that this patient is possibly a poor candidate for surgery, but I would like a neurosurgeon to evaluate him for a possible surgical intervention and treatment of spinal stenosis.  #3 Files vessel disease, brain and cerebellar atrophy , dementia.  I would like for the patient to continue to take an aspirin, his blood pressure and  lipid control has been under Dr. Lanell Matar care.   I have suggested an EMG and nerve conduction study to rule out any alpha motor neuron disorders.

## 2012-12-26 ENCOUNTER — Telehealth: Payer: Self-pay

## 2012-12-26 NOTE — Telephone Encounter (Signed)
I called and spoke with patient's wife. She was wondering why her husband is having an appointment so soon when he jjust saw Dr. Vickey Huger last week. I explained that there are studies/tests. Wife said she has to coordinate ambulance service for the appointments and they will be here next Wednesday, Aug. 27, 2014.

## 2012-12-29 ENCOUNTER — Telehealth: Payer: Self-pay | Admitting: Neurology

## 2012-12-29 NOTE — Telephone Encounter (Signed)
Completed, informed patient

## 2013-01-04 ENCOUNTER — Encounter (INDEPENDENT_AMBULATORY_CARE_PROVIDER_SITE_OTHER): Payer: Medicare Other | Admitting: Radiology

## 2013-01-04 ENCOUNTER — Ambulatory Visit (INDEPENDENT_AMBULATORY_CARE_PROVIDER_SITE_OTHER): Payer: Medicare Other | Admitting: Neurology

## 2013-01-04 DIAGNOSIS — R269 Unspecified abnormalities of gait and mobility: Secondary | ICD-10-CM

## 2013-01-04 DIAGNOSIS — G5602 Carpal tunnel syndrome, left upper limb: Secondary | ICD-10-CM

## 2013-01-04 DIAGNOSIS — M48061 Spinal stenosis, lumbar region without neurogenic claudication: Secondary | ICD-10-CM

## 2013-01-04 DIAGNOSIS — R531 Weakness: Secondary | ICD-10-CM

## 2013-01-04 DIAGNOSIS — Z0289 Encounter for other administrative examinations: Secondary | ICD-10-CM

## 2013-01-04 DIAGNOSIS — F0390 Unspecified dementia without behavioral disturbance: Secondary | ICD-10-CM

## 2013-01-04 NOTE — Procedures (Signed)
HISTORY:  [name] is a 77 year old gentleman with a history of traumatic brain injury, chronic right hemiparesis. The patient had an alteration in his ability to ambulate in July 2014. The patient is being evaluated for possible neuropathy or a radiculopathy associated with this gait change.  NERVE CONDUCTION STUDIES:  Nerve conduction studies were performed on the left upper extremity. The distal motor latency for the left median nerve was prolonged, with a slightly low motor amplitude. The distal motor latency and motor amplitude for the left ulnar nerve was normal. The F wave latencies and nerve conduction velocities for the left median and ulnar nerves were normal. The sensory latency for the left median nerve was prolonged, normal for the left ulnar nerve.  Nerve conduction studies were performed on the left lower extremity. The distal motor latencies for the left peroneal and posterior tibial nerves were normal, with low motor amplitudes for these nerves. The nerve conduction velocities for the left peroneal and posterior tibial nerves were normal. The F wave latencies for the left peroneal and posterior tibial nerves were prolonged. The left peroneal sensory latency was normal.  EMG STUDIES:  EMG study was performed on the left upper extremity:  The first dorsal interosseous muscle reveals 2 to 4 K units with full recruitment. No fibrillations or positive waves were noted. The abductor pollicis brevis muscle reveals 2 to 4 K units with full recruitment. No fibrillations or positive waves were noted. The extensor indicis proprius muscle reveals 1 to 3 K units with full recruitment. No fibrillations or positive waves were noted. The pronator teres muscle reveals 2 to 3 K units with full recruitment. No fibrillations or positive waves were noted. The biceps muscle reveals 1 to 2 K units with full recruitment. No fibrillations or positive waves were noted. Occasional complex repetitive  discharges were seen. The triceps muscle reveals 2 to 4 K units with full recruitment. No fibrillations or positive waves were noted. The anterior deltoid muscle reveals 2 to 3 K units with full recruitment. No fibrillations or positive waves were noted. The cervical paraspinal muscles were tested at 2 levels. No abnormalities of insertional activity were seen at either level tested. There was good relaxation.  EMG study was performed on the left lower extremity:  The tibialis anterior muscle reveals 2 to 4K motor units with full recruitment. No fibrillations or positive waves were seen. The peroneus tertius muscle reveals 2 to 4K motor units with full recruitment. No fibrillations or positive waves were seen. The medial gastrocnemius muscle reveals 1 to 3K motor units with full recruitment. No fibrillations or positive waves were seen. The vastus lateralis muscle reveals 2 to 4K motor units with full recruitment. No fibrillations or positive waves were seen. The iliopsoas muscle reveals 2 to 4K motor units with full recruitment. No fibrillations or positive waves were seen. The biceps femoris muscle (long head) reveals 2 to 4K motor units with full recruitment. No fibrillations or positive waves were seen. The lumbosacral paraspinal muscles were tested at 3 levels, and revealed no abnormalities of insertional activity at all 3 levels tested. There was good relaxation.   IMPRESSION:  Nerve conduction studies of the left upper and left lower extremity does not clearly show evidence of an overlying peripheral neuropathy. There appears to be evidence of a mild to moderate left carpal tunnel syndrome. EMG evaluation of the left upper and left lower extremities were unremarkable, without evidence of a cervical or a lumbosacral radiculopathy on the left.  Marlan Palau MD 01/04/2013 4:11 PM  [facility] [address] [place], [place] [address]  Phone  [phone] Fax [phone]

## 2013-01-05 ENCOUNTER — Ambulatory Visit: Payer: Self-pay | Admitting: Neurology

## 2013-01-08 ENCOUNTER — Encounter: Payer: Self-pay | Admitting: Neurology

## 2013-01-10 ENCOUNTER — Encounter: Payer: Self-pay | Admitting: Neurology

## 2013-01-19 ENCOUNTER — Other Ambulatory Visit: Payer: Self-pay | Admitting: Neurosurgery

## 2013-01-19 DIAGNOSIS — M542 Cervicalgia: Secondary | ICD-10-CM

## 2013-01-23 ENCOUNTER — Ambulatory Visit
Admission: RE | Admit: 2013-01-23 | Discharge: 2013-01-23 | Disposition: A | Discharge status: Home / Self Care | Payer: Medicare Other | Source: Ambulatory Visit | Attending: Neurosurgery | Admitting: Neurosurgery

## 2013-01-23 DIAGNOSIS — M542 Cervicalgia: Secondary | ICD-10-CM

## 2013-01-25 ENCOUNTER — Ambulatory Visit: Payer: Self-pay | Admitting: Neurology

## 2013-01-25 ENCOUNTER — Other Ambulatory Visit: Payer: Medicare Other

## 2013-03-11 ENCOUNTER — Other Ambulatory Visit: Payer: Self-pay | Admitting: Neurology

## 2013-04-18 ENCOUNTER — Encounter (INDEPENDENT_AMBULATORY_CARE_PROVIDER_SITE_OTHER): Payer: Self-pay

## 2013-04-18 ENCOUNTER — Encounter: Payer: Self-pay | Admitting: Neurology

## 2013-04-18 ENCOUNTER — Ambulatory Visit (INDEPENDENT_AMBULATORY_CARE_PROVIDER_SITE_OTHER): Payer: Medicare Other | Admitting: Neurology

## 2013-04-18 VITALS — BP 131/78 | HR 75

## 2013-04-18 DIAGNOSIS — R270 Ataxia, unspecified: Secondary | ICD-10-CM

## 2013-04-18 DIAGNOSIS — F0390 Unspecified dementia without behavioral disturbance: Secondary | ICD-10-CM | POA: Insufficient documentation

## 2013-04-18 DIAGNOSIS — F03918 Unspecified dementia, unspecified severity, with other behavioral disturbance: Secondary | ICD-10-CM

## 2013-04-18 DIAGNOSIS — F0391 Unspecified dementia with behavioral disturbance: Secondary | ICD-10-CM

## 2013-04-18 DIAGNOSIS — R279 Unspecified lack of coordination: Secondary | ICD-10-CM

## 2013-04-18 DIAGNOSIS — I69959 Hemiplegia and hemiparesis following unspecified cerebrovascular disease affecting unspecified side: Secondary | ICD-10-CM

## 2013-04-18 DIAGNOSIS — M48062 Spinal stenosis, lumbar region with neurogenic claudication: Secondary | ICD-10-CM

## 2013-04-18 DIAGNOSIS — S06890D Other specified intracranial injury without loss of consciousness, subsequent encounter: Secondary | ICD-10-CM

## 2013-04-18 DIAGNOSIS — I69351 Hemiplegia and hemiparesis following cerebral infarction affecting right dominant side: Secondary | ICD-10-CM

## 2013-04-18 HISTORY — DX: Unspecified dementia with behavioral disturbance: F03.91

## 2013-04-18 HISTORY — DX: Unspecified dementia, unspecified severity, with other behavioral disturbance: F03.918

## 2013-04-18 MED ORDER — MEMANTINE HCL 28 X 5 MG & 21 X 10 MG PO TABS: ORAL_TABLET | ORAL | Status: DC

## 2013-04-18 NOTE — Patient Instructions (Signed)
Ataxia You have an unsteady walk called ataxia. Your condition may require further tests. Ataxia can be caused by:  Neurological conditions.  Infections.  Physical exhaustion.  Internal bleeding.  Alcohol intoxication, or medication side effects.  Problems with circulation, blood pressure, and heart disease can also make you unsteady. Laboratory and X-ray tests may be needed.  Treatment for now:  Get plenty of rest and eat a nutritious diet over the next weeks.  Avoid alcohol.  If you become very unsteady, dizzy, nauseated, or feel like you are going to faint, lie down flat right away.  Wait until all your symptoms pass before you get up again. SEEK IMMEDIATE MEDICAL CARE IF:  You develop severe unsteadiness, headache, chest pain, or abdominal pain.  You have weakness or numbness on one side of your body.  You have problems with your vision.  You develop confusion or difficulty speaking.  You have a fever, chills, or an irregular heartbeat or a very fast pulse. MAKE SURE YOU:   Understand these instructions.  Will watch your condition.  Will get help right away if you are not doing well or get worse. Document Released: 04/27/2005 Document Revised: 07/20/2011 Document Reviewed: 10/14/2006 [employer] Patient Information 2014 [employer].  

## 2013-04-18 NOTE — Addendum Note (Signed)
Addended by: Melvyn Novas on: 04/18/2013 03:37 PM   Modules accepted: Orders
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
## 2013-04-25 LAB — METHYLMALONIC ACID, SERUM: Methylmalonic Acid: 182 nmol/L (ref 0–378)

## 2013-05-15 ENCOUNTER — Other Ambulatory Visit: Payer: Self-pay | Admitting: Neurology

## 2013-07-27 ENCOUNTER — Telehealth: Payer: Self-pay | Admitting: Neurology

## 2013-07-27 NOTE — Telephone Encounter (Signed)
Pt's wife called states that pt's is very slurred voice or vision isn't good. Pt's wife is very upset and needs someone to call her back. Worried about memory.

## 2013-07-28 NOTE — Telephone Encounter (Signed)
I have called, could not reach patient, or  his family, could not leave a message either

## 2013-07-29 ENCOUNTER — Encounter (HOSPITAL_COMMUNITY): Payer: Self-pay | Admitting: Emergency Medicine

## 2013-07-29 ENCOUNTER — Emergency Department (HOSPITAL_COMMUNITY): Payer: Medicare Other

## 2013-07-29 ENCOUNTER — Emergency Department (HOSPITAL_COMMUNITY)
Admission: EM | Admit: 2013-07-29 | Discharge: 2013-07-29 | Disposition: A | Discharge status: Home / Self Care | Payer: Medicare Other | Attending: Emergency Medicine | Admitting: Emergency Medicine

## 2013-07-29 DIAGNOSIS — I10 Essential (primary) hypertension: Secondary | ICD-10-CM | POA: Insufficient documentation

## 2013-07-29 DIAGNOSIS — G8929 Other chronic pain: Secondary | ICD-10-CM | POA: Insufficient documentation

## 2013-07-29 DIAGNOSIS — Z8639 Personal history of other endocrine, nutritional and metabolic disease: Secondary | ICD-10-CM | POA: Insufficient documentation

## 2013-07-29 DIAGNOSIS — R4789 Other speech disturbances: Secondary | ICD-10-CM | POA: Insufficient documentation

## 2013-07-29 DIAGNOSIS — R5381 Other malaise: Secondary | ICD-10-CM | POA: Insufficient documentation

## 2013-07-29 DIAGNOSIS — Z87820 Personal history of traumatic brain injury: Secondary | ICD-10-CM | POA: Insufficient documentation

## 2013-07-29 DIAGNOSIS — Z7982 Long term (current) use of aspirin: Secondary | ICD-10-CM | POA: Insufficient documentation

## 2013-07-29 DIAGNOSIS — Z862 Personal history of diseases of the blood and blood-forming organs and certain disorders involving the immune mechanism: Secondary | ICD-10-CM | POA: Insufficient documentation

## 2013-07-29 DIAGNOSIS — Z79899 Other long term (current) drug therapy: Secondary | ICD-10-CM | POA: Insufficient documentation

## 2013-07-29 DIAGNOSIS — G8190 Hemiplegia, unspecified affecting unspecified side: Secondary | ICD-10-CM | POA: Insufficient documentation

## 2013-07-29 DIAGNOSIS — R531 Weakness: Secondary | ICD-10-CM

## 2013-07-29 DIAGNOSIS — R5383 Other fatigue: Principal | ICD-10-CM

## 2013-07-29 DIAGNOSIS — R209 Unspecified disturbances of skin sensation: Secondary | ICD-10-CM | POA: Insufficient documentation

## 2013-07-29 DIAGNOSIS — F0390 Unspecified dementia without behavioral disturbance: Secondary | ICD-10-CM | POA: Insufficient documentation

## 2013-07-29 DIAGNOSIS — F341 Dysthymic disorder: Secondary | ICD-10-CM | POA: Insufficient documentation

## 2013-07-29 DIAGNOSIS — Z880 Allergy status to penicillin: Secondary | ICD-10-CM | POA: Insufficient documentation

## 2013-07-29 LAB — DIFFERENTIAL
BASOS PCT: 0 % (ref 0–1)
Basophils Absolute: 0 10*3/uL (ref 0.0–0.1)
Eosinophils Absolute: 0.1 10*3/uL (ref 0.0–0.7)
Eosinophils Relative: 2 % (ref 0–5)
LYMPHS ABS: 1.2 10*3/uL (ref 0.7–4.0)
Lymphocytes Relative: 17 % (ref 12–46)
MONOS PCT: 7 % (ref 3–12)
Monocytes Absolute: 0.5 10*3/uL (ref 0.1–1.0)
NEUTROS ABS: 5.1 10*3/uL (ref 1.7–7.7)
NEUTROS PCT: 74 % (ref 43–77)

## 2013-07-29 LAB — COMPREHENSIVE METABOLIC PANEL
ALK PHOS: 45 U/L (ref 39–117)
ALT: 14 U/L (ref 0–53)
AST: 29 U/L (ref 0–37)
Albumin: 3.3 g/dL — ABNORMAL LOW (ref 3.5–5.2)
BILIRUBIN TOTAL: 0.4 mg/dL (ref 0.3–1.2)
BUN: 9 mg/dL (ref 6–23)
CHLORIDE: 105 meq/L (ref 96–112)
CO2: 22 mEq/L (ref 19–32)
Calcium: 8.9 mg/dL (ref 8.4–10.5)
Creatinine, Ser: 0.61 mg/dL (ref 0.50–1.35)
GFR calc Af Amer: >90 mL/min (ref >90)
GFR calc non Af Amer: >90 mL/min (ref >90)
Glucose, Bld: 114 mg/dL — ABNORMAL HIGH (ref 70–99)
POTASSIUM: 5 meq/L (ref 3.7–5.3)
SODIUM: 141 meq/L (ref 137–147)
Total Protein: 6.1 g/dL (ref 6.0–8.3)

## 2013-07-29 LAB — URINALYSIS, ROUTINE W REFLEX MICROSCOPIC
BILIRUBIN URINE: NEGATIVE
Glucose, UA: NEGATIVE mg/dL
Hgb urine dipstick: NEGATIVE
Ketones, ur: NEGATIVE mg/dL
Leukocytes, UA: NEGATIVE
Nitrite: NEGATIVE
PH: 7.5 (ref 5.0–8.0)
Protein, ur: NEGATIVE mg/dL
SPECIFIC GRAVITY, URINE: 1.013 (ref 1.005–1.030)
UROBILINOGEN UA: 0.2 mg/dL (ref 0.0–1.0)

## 2013-07-29 LAB — CBC
HCT: 43.8 % (ref 39.0–52.0)
Hemoglobin: 14.7 g/dL (ref 13.0–17.0)
MCH: 27.5 pg (ref 26.0–34.0)
MCHC: 33.6 g/dL (ref 30.0–36.0)
MCV: 82 fL (ref 78.0–100.0)
PLATELETS: 218 10*3/uL (ref 150–400)
RBC: 5.34 MIL/uL (ref 4.22–5.81)
RDW: 15.3 % (ref 11.5–15.5)
WBC: 6.9 10*3/uL (ref 4.0–10.5)

## 2013-07-29 LAB — PROTIME-INR
INR: 0.95 (ref 0.00–1.49)
Prothrombin Time: 12.5 seconds (ref 11.6–15.2)

## 2013-07-29 LAB — I-STAT TROPONIN, ED: TROPONIN I, POC: 0 ng/mL (ref 0.00–0.08)

## 2013-07-29 LAB — APTT: APTT: 29 s (ref 24–37)

## 2013-07-29 NOTE — ED Notes (Addendum)
Per EMS, pt has been experiencing worsening weakness over the past 10 days.  Pt has hx of right sided hemiplegia.  Pt went to see PCP about 5 days ago.  Lab work was done but nothing found.  Pt has had increased slurred speech according to wife.  Pt denies pain.  Pt incontinent.  CBG 97, BP 170/86, P 60.    Pt oriented x3 (disoriented to time).  Pt reports feeling very weak all over.  Denies numbness/tingling.  Pt reports having difficulty speaking, slurring speech.  Pt denies pain.  No apparent distress. Respirations equal and unlabored.  Skin warm and dry

## 2013-07-29 NOTE — ED Notes (Addendum)
Pt's wife reports extreme generalized weakness.  Reports pt unable to sit up on his own (which he could do before).  Wife reports pt's bp was high this morning.  Subsequent BPs were more in his normal range.  Called PCP after first BP and he suggested coming to ED.  Wife reports increased slurred speech.  August last year, had suspected stroke but did not actually have one.  Has seen several specialists since then and haven't found a cause.

## 2013-07-29 NOTE — ED Notes (Signed)
Pt returned from radiology.

## 2013-07-29 NOTE — ED Provider Notes (Addendum)
CSN: [id number]     Arrival date & time 07/29/13  1559 History   First MD Initiated Contact with Patient 07/29/13 1632     Chief Complaint  Patient presents with  . Weakness     (Consider location/radiation/quality/duration/timing/severity/associated sxs/prior Treatment) HPI Comments: Patient presents with generalized weakness. 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 He has baseline weakness and numbness of the right side of his face in his right arm and right leg. He also has a chronic lateral rectus palsy. His wife states over the last 10 days he's had increasing generalized weakness. He got the point now where he is unable to sit up on his own or stand up on his own. He normally gets around at home in a wheelchair but currently is unable to sit on the side of bed. He otherwise hasn't had any recent illnesses. He's had no fevers. He denies any chest pain or shortness of breath. He denies any vomiting or diarrhea. He has had some slurring of his speech over the last 2-3 days per the wife. He was seen by his primary care physician Dr. Jacky Kindle last week. His wife states he did have lab work and didn't find any abnormalities. Her last 2 days the weakness has progressed.  The wife called his primary care office back and he was told to come to the emergency room for possible CT Head. He has had some worsening gait problems over the last few months and has seen several different neurologists/other specialist with no clear cause for the gait disturbance.   Past Medical History  Diagnosis Date  . Hemiplegia   . Vertigo   . Hypertension   . Restless leg syndrome   . TBI (traumatic brain injury)     WITH LEFT HEMISPHERIC BLEED  . Depression with anxiety   . High cholesterol   . Chronic pain     lower back  . Dementia   . Tremor     familiar  . Swelling of upper lip     tongue  . Weakness of both legs   . Weakness generalized 12/22/2012  . Dementia with  behavioral disturbance 04/18/2013   Past Surgical History  Procedure Laterality Date  . Back surgery    . Total knee arthroplasty    . Hernia repair  1985  . Laminectomy  2006  . Colon resection  1996  . Dental surgery  3/14    all upper teeth removed   Family History  Problem Relation Age of Onset  . Tremor Maternal Grandfather    History  Substance Use Topics  . Smoking status: Never Smoker   . Smokeless tobacco: Never Used     Comment: quit in 1965  . Alcohol Use: No    Review of Systems  Constitutional: Negative for fever, chills, diaphoresis and fatigue.  HENT: Negative for congestion, rhinorrhea and sneezing.   Eyes: Negative.   Respiratory: Negative for cough, chest tightness and shortness of breath.   Cardiovascular: Negative for chest pain and leg swelling.  Gastrointestinal: Negative for nausea, vomiting, abdominal pain, diarrhea and blood in stool.  Genitourinary: Negative for frequency, hematuria, flank pain and difficulty urinating.  Musculoskeletal: Negative for arthralgias and back pain.  Skin: Negative for rash.  Neurological: Positive for speech difficulty and weakness (generalized). Negative for dizziness, numbness and headaches.      Allergies  Ace inhibitors and Penicillins  Home Medications   Current Outpatient Rx  Name  Route  Sig  Dispense  Refill  . ascorbic acid (VITAMIN C) 1000 MG tablet   Oral   Take 1,000 mg by mouth daily.         Marland Kitchen aspirin 325 MG tablet   Oral   Take 325 mg by mouth daily.         . baclofen (LIORESAL) 10 MG tablet   Oral   Take 10 mg by mouth at bedtime.         Marland Kitchen BYSTOLIC 10 MG tablet   Oral   Take 10 mg by mouth daily.          . Calcium Carbonate-Vitamin D (CALCIUM + D PO)   Oral   Take 1 tablet by mouth daily.          . cholecalciferol (VITAMIN D) 1000 UNITS tablet   Oral   Take 1,000 Units by mouth daily.           . clonazePAM (KLONOPIN) 1 MG tablet   Oral   Take 0.5-1 mg by mouth  at bedtime as needed for anxiety.         . cyanocobalamin 500 MCG tablet   Oral   Take 500 mcg by mouth daily.         . diclofenac sodium (VOLTAREN) 1 % GEL   Topical   Apply 2 g topically 4 (four) times daily as needed (Pain).         Marland Kitchen EPINEPHrine (EPIPEN) 0.3 mg/0.3 mL DEVI   Intramuscular   Inject 0.3 mg into the muscle once. As needed for anaphylaxis (unknown cause)         . HYDROcodone-acetaminophen (NORCO/VICODIN) 5-325 MG per tablet   Oral   Take 1 tablet by mouth every 6 (six) hours as needed for pain.         Marland Kitchen lidocaine (LIDODERM) 5 %   Transdermal   Place 1 patch onto the skin daily as needed (Back pain). Remove & Discard patch within 12 hours or as directed by MD         . meclizine (ANTIVERT) 25 MG tablet   Oral   Take 25 mg by mouth 3 (three) times daily as needed for dizziness.          . Multiple Vitamin (MULITIVITAMIN WITH MINERALS) TABS   Oral   Take 1 tablet by mouth daily.            BP 149/80  Pulse 70  Temp(Src) 97.9 F (36.6 C) (Oral)  Resp 17  Ht 6\' 2"  (1.88 m)  Wt 185 lb (83.915 kg)  BMI 23.74 kg/m2  SpO2 99% Physical Exam  Constitutional: He is oriented to person, place, and time. He appears well-developed and well-nourished.  HENT:  Head: Normocephalic and atraumatic.  Eyes: Conjunctivae are normal. Pupils are equal, round, and reactive to light.  Neck: Normal range of motion. Neck supple.  Cardiovascular: Normal rate, regular rhythm and normal heart sounds.   Pulmonary/Chest: Effort normal and breath sounds normal. No respiratory distress. He has no wheezes. He has no rales. He exhibits no tenderness.  Abdominal: Soft. Bowel sounds are normal. There is no tenderness. There is no rebound and no guarding.  Musculoskeletal: Normal range of motion. He exhibits no edema.  Lymphadenopathy:    He has no cervical adenopathy.  Neurological: He is alert and oriented to person, place, and time.  Patient has some slight  right-sided facial drooping and right-sided facial numbness which is chronic and unchanged  per the wife. He does have a lateral rectus palsy which is chronic. I don't appreciate any other cranial nerve deficits. Motor is 5 out of 5 on the left upper and lower extremities and 3/5 to the right upper and lower extremities. Finger to nose is intact on the left. he's unable to do it on the right.  Skin: Skin is warm and dry. No rash noted.  Psychiatric: He has a normal mood and affect.    ED Course  Procedures (including critical care time) Labs Review Results for orders placed during the hospital encounter of 07/29/13  PROTIME-INR      Result Value Ref Range   Prothrombin Time 12.5  11.6 - 15.2 seconds   INR 0.95  0.00 - 1.49  APTT      Result Value Ref Range   aPTT 29  24 - 37 seconds  CBC      Result Value Ref Range   WBC 6.9  4.0 - 10.5 K/uL   RBC 5.34  4.22 - 5.81 MIL/uL   Hemoglobin 14.7  13.0 - 17.0 g/dL   HCT 16.1  09.6 - 04.5 %   MCV 82.0  78.0 - 100.0 fL   MCH 27.5  26.0 - 34.0 pg   MCHC 33.6  30.0 - 36.0 g/dL   RDW 40.9  81.1 - 91.4 %   Platelets 218  150 - 400 K/uL  DIFFERENTIAL      Result Value Ref Range   Neutrophils Relative % 74  43 - 77 %   Neutro Abs 5.1  1.7 - 7.7 K/uL   Lymphocytes Relative 17  12 - 46 %   Lymphs Abs 1.2  0.7 - 4.0 K/uL   Monocytes Relative 7  3 - 12 %   Monocytes Absolute 0.5  0.1 - 1.0 K/uL   Eosinophils Relative 2  0 - 5 %   Eosinophils Absolute 0.1  0.0 - 0.7 K/uL   Basophils Relative 0  0 - 1 %   Basophils Absolute 0.0  0.0 - 0.1 K/uL  COMPREHENSIVE METABOLIC PANEL      Result Value Ref Range   Sodium 141  137 - 147 mEq/L   Potassium 5.0  3.7 - 5.3 mEq/L   Chloride 105  96 - 112 mEq/L   CO2 22  19 - 32 mEq/L   Glucose, Bld 114 (*) 70 - 99 mg/dL   BUN 9  6 - 23 mg/dL   Creatinine, Ser 7.82  0.50 - 1.35 mg/dL   Calcium 8.9  8.4 - 95.6 mg/dL   Total Protein 6.1  6.0 - 8.3 g/dL   Albumin 3.3 (*) 3.5 - 5.2 g/dL   AST 29  0 - 37 U/L    ALT 14  0 - 53 U/L   Alkaline Phosphatase 45  39 - 117 U/L   Total Bilirubin 0.4  0.3 - 1.2 mg/dL   GFR calc non Af Amer >90  >90 mL/min   GFR calc Af Amer >90  >90 mL/min  URINALYSIS, ROUTINE W REFLEX MICROSCOPIC      Result Value Ref Range   Color, Urine YELLOW  YELLOW   APPearance CLEAR  CLEAR   Specific Gravity, Urine 1.013  1.005 - 1.030   pH 7.5  5.0 - 8.0   Glucose, UA NEGATIVE  NEGATIVE mg/dL   Hgb urine dipstick NEGATIVE  NEGATIVE   Bilirubin Urine NEGATIVE  NEGATIVE   Ketones, ur NEGATIVE  NEGATIVE mg/dL   Protein,  ur NEGATIVE  NEGATIVE mg/dL   Urobilinogen, UA 0.2  0.0 - 1.0 mg/dL   Nitrite NEGATIVE  NEGATIVE   Leukocytes, UA NEGATIVE  NEGATIVE  I-STAT TROPOININ, ED      Result Value Ref Range   Troponin i, poc 0.00  0.00 - 0.08 ng/mL   Comment 3            Ct Head (brain) Wo Contrast  07/29/2013   CLINICAL DATA:  Generalized weakness, slurred speech, hypertension  EXAM: CT HEAD WITHOUT CONTRAST  TECHNIQUE: Contiguous axial images were obtained from the base of the skull through the vertex without intravenous contrast.  COMPARISON:  MRI brain dated 12/09/2012  FINDINGS: No evidence of parenchymal hemorrhage or extra-axial fluid collection. No mass lesion, mass effect, or midline shift.  No CT evidence of acute infarction. 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  Subcortical white matter and periventricular Falkner vessel ischemic changes. Intracranial atherosclerosis.  Global cortical atrophy with secondary ventricular prominence.  The visualized paranasal sinuses are essentially clear. The mastoid air cells are unopacified.  No evidence of calvarial fracture.  IMPRESSION: No evidence of acute intracranial abnormality.  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  Atrophy with [place] vessel ischemic changes and intracranial atherosclerosis.   Electronically Signed   By: Charline BillsSriyesh  Krishnan M.D.   On: 07/29/2013 17:35     Imaging Review Ct Head (brain) Wo Contrast  07/29/2013   CLINICAL DATA:   Generalized weakness, slurred speech, hypertension  EXAM: CT HEAD WITHOUT CONTRAST  TECHNIQUE: Contiguous axial images were obtained from the base of the skull through the vertex without intravenous contrast.  COMPARISON:  MRI brain dated 12/09/2012  FINDINGS: No evidence of parenchymal hemorrhage or extra-axial fluid collection. No mass lesion, mass effect, or midline shift.  No CT evidence of acute infarction. 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  Subcortical white matter and periventricular Hamor vessel ischemic changes. Intracranial atherosclerosis.  Global cortical atrophy with secondary ventricular prominence.  The visualized paranasal sinuses are essentially clear. The mastoid air cells are unopacified.  No evidence of calvarial fracture.  IMPRESSION: No evidence of acute intracranial abnormality.  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  Atrophy with Spindler vessel ischemic changes and intracranial atherosclerosis.   Electronically Signed   By: Charline BillsSriyesh  Krishnan M.D.   On: 07/29/2013 17:35    Date: 07/29/2013  Rate: 62  Rhythm: normal sinus rhythm  QRS Axis: normal  Intervals: normal  ST/T Wave abnormalities: normal  Conduction Disutrbances:none  Narrative Interpretation:   Old EKG Reviewed: none available, unchanged    EKG Interpretation None     Ct Head (brain) Wo Contrast  07/29/2013   CLINICAL DATA:  Generalized weakness, slurred speech, hypertension  EXAM: CT HEAD WITHOUT CONTRAST  TECHNIQUE: Contiguous axial images were obtained from the base of the skull through the vertex without intravenous contrast.  COMPARISON:  MRI brain dated 12/09/2012  FINDINGS: No evidence of parenchymal hemorrhage or extra-axial fluid collection. No mass lesion, mass effect, or midline shift.  No CT evidence of acute infarction. 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  Subcortical white matter and periventricular Pang vessel ischemic changes. Intracranial atherosclerosis.  Global cortical atrophy with secondary ventricular  prominence.  The visualized paranasal sinuses are essentially clear. The mastoid air cells are unopacified.  No evidence of calvarial fracture.  IMPRESSION: No evidence of acute intracranial abnormality.  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  Atrophy with [place]ie vessel ischemic changes and intracranial atherosclerosis.   Electronically Signed   By: Roselie AwkwardSriyesh  Krishnan M.D.  On: 07/29/2013 17:35      MDM   Final diagnoses:  Weakness     patient with right-sided hemiplegia presents with worsening weakness for the last 10 days. He's had a progressively worsening ability to get around over the last several months. He did have some slurring of his speech but there are no other findings that would be more suggestive of an acute CVA. It sounds like this has been a ongoing progression of the last several months it just worsened over the last 10 days. He seen multiple neurologists and orthopedists who haven't been able to find the source of this worsening condition. His head CT is negative. I spoke with Dr. Clelia Croft, who is on call for Dr. Jacky Kindle. He felt that the patient could be discharged home and their office will followup with the patient on Monday. I feel this is appropriate and the patient and his wife are comfortable with this decision. I was unable to initiate home health over the weekend, but Dr. Lanell Matar office will arrange this on Monday per Dr. Clelia Croft.    Rolan Bucco, MD 07/29/13 1610  Rolan Bucco, MD 07/29/13 [phone]

## 2013-07-29 NOTE — Discharge Instructions (Signed)
Weakness  Weakness is a lack of strength. It may be felt all over the body (generalized) or in one specific part of the body (focal). Some causes of weakness can be serious. You may need further medical evaluation, especially if you are elderly or you have a history of immunosuppression (such as chemotherapy or HIV), kidney disease, heart disease, or diabetes.  CAUSES   Weakness can be caused by many different things, including:  · Infection.  · Physical exhaustion.  · Internal bleeding or other blood loss that results in a lack of red blood cells (anemia).  · Dehydration. This cause is more common in elderly people.  · Side effects or electrolyte abnormalities from medicines, such as pain medicines or sedatives.  · Emotional distress, anxiety, or depression.  · Circulation problems, especially severe peripheral arterial disease.  · Heart disease, such as rapid atrial fibrillation, bradycardia, or heart failure.  · Nervous system disorders, such as Guillain-Barré syndrome, multiple sclerosis, or stroke.  DIAGNOSIS   To find the cause of your weakness, your caregiver will take your history and perform a physical exam. Lab tests or X-rays may also be ordered, if needed.  TREATMENT   Treatment of weakness depends on the cause of your symptoms and can vary greatly.  HOME CARE INSTRUCTIONS   · Rest as needed.  · Eat a well-balanced diet.  · Try to get some exercise every day.  · Only take over-the-counter or prescription medicines as directed by your caregiver.  SEEK MEDICAL CARE IF:   · Your weakness seems to be getting worse or spreads to other parts of your body.  · You develop new aches or pains.  SEEK IMMEDIATE MEDICAL CARE IF:   · You cannot perform your normal daily activities, such as getting dressed and feeding yourself.  · You cannot walk up and down stairs, or you feel exhausted when you do so.  · You have shortness of breath or chest pain.  · You have difficulty moving parts of your body.  · You have weakness  in only one area of the body or on only one side of the body.  · You have a fever.  · You have trouble speaking or swallowing.  · You cannot control your bladder or bowel movements.  · You have black or bloody vomit or stools.  MAKE SURE YOU:  · Understand these instructions.  · Will watch your condition.  · Will get help right away if you are not doing well or get worse.  Document Released: 04/27/2005 Document Revised: 10/27/2011 Document Reviewed: 06/26/2011  [employer]® Patient Information ©2014 [employer].

## 2013-08-03 ENCOUNTER — Emergency Department (HOSPITAL_COMMUNITY)
Admission: EM | Admit: 2013-08-03 | Discharge: 2013-08-03 | Disposition: A | Discharge status: Home / Self Care | Payer: Medicare Other | Attending: Emergency Medicine | Admitting: Emergency Medicine

## 2013-08-03 ENCOUNTER — Emergency Department (HOSPITAL_COMMUNITY): Payer: Medicare Other

## 2013-08-03 ENCOUNTER — Encounter (HOSPITAL_COMMUNITY): Payer: Self-pay | Admitting: Emergency Medicine

## 2013-08-03 DIAGNOSIS — G8929 Other chronic pain: Secondary | ICD-10-CM | POA: Insufficient documentation

## 2013-08-03 DIAGNOSIS — F03918 Unspecified dementia, unspecified severity, with other behavioral disturbance: Secondary | ICD-10-CM | POA: Insufficient documentation

## 2013-08-03 DIAGNOSIS — K8020 Calculus of gallbladder without cholecystitis without obstruction: Secondary | ICD-10-CM | POA: Insufficient documentation

## 2013-08-03 DIAGNOSIS — Z79899 Other long term (current) drug therapy: Secondary | ICD-10-CM | POA: Insufficient documentation

## 2013-08-03 DIAGNOSIS — Z7982 Long term (current) use of aspirin: Secondary | ICD-10-CM | POA: Insufficient documentation

## 2013-08-03 DIAGNOSIS — Z880 Allergy status to penicillin: Secondary | ICD-10-CM | POA: Insufficient documentation

## 2013-08-03 DIAGNOSIS — E7800 Pure hypercholesterolemia, unspecified: Secondary | ICD-10-CM | POA: Insufficient documentation

## 2013-08-03 DIAGNOSIS — Z87820 Personal history of traumatic brain injury: Secondary | ICD-10-CM | POA: Insufficient documentation

## 2013-08-03 DIAGNOSIS — I10 Essential (primary) hypertension: Secondary | ICD-10-CM | POA: Insufficient documentation

## 2013-08-03 DIAGNOSIS — F341 Dysthymic disorder: Secondary | ICD-10-CM | POA: Insufficient documentation

## 2013-08-03 DIAGNOSIS — Z9889 Other specified postprocedural states: Secondary | ICD-10-CM | POA: Insufficient documentation

## 2013-08-03 DIAGNOSIS — F0391 Unspecified dementia with behavioral disturbance: Secondary | ICD-10-CM | POA: Insufficient documentation

## 2013-08-03 LAB — URINALYSIS, ROUTINE W REFLEX MICROSCOPIC
BILIRUBIN URINE: NEGATIVE
GLUCOSE, UA: 250 mg/dL — AB
Ketones, ur: NEGATIVE mg/dL
Leukocytes, UA: NEGATIVE
Nitrite: NEGATIVE
PH: 7 (ref 5.0–8.0)
Protein, ur: NEGATIVE mg/dL
Specific Gravity, Urine: 1.015 (ref 1.005–1.030)
Urobilinogen, UA: 0.2 mg/dL (ref 0.0–1.0)

## 2013-08-03 LAB — CBC WITH DIFFERENTIAL/PLATELET
Basophils Absolute: 0 10*3/uL (ref 0.0–0.1)
Basophils Relative: 0 % (ref 0–1)
EOS ABS: 0.1 10*3/uL (ref 0.0–0.7)
EOS PCT: 1 % (ref 0–5)
HCT: 43.5 % (ref 39.0–52.0)
Hemoglobin: 14.4 g/dL (ref 13.0–17.0)
LYMPHS ABS: 1.7 10*3/uL (ref 0.7–4.0)
LYMPHS PCT: 19 % (ref 12–46)
MCH: 27.2 pg (ref 26.0–34.0)
MCHC: 33.1 g/dL (ref 30.0–36.0)
MCV: 82.1 fL (ref 78.0–100.0)
MONOS PCT: 9 % (ref 3–12)
Monocytes Absolute: 0.8 10*3/uL (ref 0.1–1.0)
Neutro Abs: 6.4 10*3/uL (ref 1.7–7.7)
Neutrophils Relative %: 71 % (ref 43–77)
Platelets: 242 10*3/uL (ref 150–400)
RBC: 5.3 MIL/uL (ref 4.22–5.81)
RDW: 15.3 % (ref 11.5–15.5)
WBC: 9 10*3/uL (ref 4.0–10.5)

## 2013-08-03 LAB — COMPREHENSIVE METABOLIC PANEL
ALT: 13 U/L (ref 0–53)
AST: 16 U/L (ref 0–37)
Albumin: 3.5 g/dL (ref 3.5–5.2)
Alkaline Phosphatase: 49 U/L (ref 39–117)
BUN: 15 mg/dL (ref 6–23)
CALCIUM: 9.1 mg/dL (ref 8.4–10.5)
CO2: 22 meq/L (ref 19–32)
Chloride: 103 mEq/L (ref 96–112)
Creatinine, Ser: 0.71 mg/dL (ref 0.50–1.35)
GFR, EST NON AFRICAN AMERICAN: 89 mL/min — AB (ref >90)
GLUCOSE: 163 mg/dL — AB (ref 70–99)
Potassium: 3.8 mEq/L (ref 3.7–5.3)
SODIUM: 137 meq/L (ref 137–147)
TOTAL PROTEIN: 6.2 g/dL (ref 6.0–8.3)
Total Bilirubin: 0.3 mg/dL (ref 0.3–1.2)

## 2013-08-03 LAB — I-STAT TROPONIN, ED: Troponin i, poc: 0 ng/mL (ref 0.00–0.08)

## 2013-08-03 LAB — LIPASE, BLOOD: LIPASE: 201 U/L — AB (ref 11–59)

## 2013-08-03 LAB — URINE MICROSCOPIC-ADD ON

## 2013-08-03 MED ORDER — ONDANSETRON HCL 4 MG/2ML IJ SOLN
4.0000 mg | Freq: Once | INTRAMUSCULAR | Status: AC
  Administered 2013-08-03: 4 mg via INTRAVENOUS
  Filled 2013-08-03: qty 2

## 2013-08-03 MED ORDER — SODIUM CHLORIDE 0.9 % IV SOLN
Freq: Once | INTRAVENOUS | Status: AC
  Administered 2013-08-03: 01:00:00 via INTRAVENOUS

## 2013-08-03 MED ORDER — ONDANSETRON HCL 4 MG PO TABS: 4.0000 mg | ORAL_TABLET | Freq: Four times a day (QID) | ORAL | Status: DC

## 2013-08-03 MED ORDER — HYDROCODONE-ACETAMINOPHEN 5-325 MG PO TABS: 1.0000 | ORAL_TABLET | Freq: Four times a day (QID) | ORAL | Status: DC | PRN

## 2013-08-03 MED ORDER — FENTANYL CITRATE 0.05 MG/ML IJ SOLN
50.0000 ug | Freq: Once | INTRAMUSCULAR | Status: AC
  Administered 2013-08-03: 50 ug via INTRAVENOUS
  Filled 2013-08-03: qty 2

## 2013-08-03 NOTE — ED Provider Notes (Addendum)
CSN: [id number]     Arrival date & time 08/03/13  0054 History   First MD Initiated Contact with Patient 08/03/13 [phone]     Chief Complaint  Patient presents with  . Abdominal Pain     (Consider location/radiation/quality/duration/timing/severity/associated sxs/prior Treatment) Patient is a 78 y.o. male presenting with abdominal pain. The history is provided by the patient and the spouse.  Abdominal Pain Pain location: pt is poor historian and states he abdomen hurts but not specific. Pain quality: sharp, shooting and stabbing   Pain radiates to:  Back Pain severity:  Severe Onset quality:  Sudden Duration:  3 hours Timing:  Constant Progression:  Unchanged Chronicity:  New Context: not eating   Context comment:  Started suddenly at 10pm Relieved by:  Nothing Worsened by:  Nothing tried Ineffective treatments:  None tried Associated symptoms: anorexia, nausea and vomiting   Associated symptoms: no constipation, no cough, no diarrhea and no dysuria   Risk factors: being elderly   Risk factors: no alcohol abuse, has not had multiple surgeries and no recent hospitalization     Past Medical History  Diagnosis Date  . Hemiplegia   . Vertigo   . Hypertension   . Restless leg syndrome   . TBI (traumatic brain injury)     WITH LEFT HEMISPHERIC BLEED  . Depression with anxiety   . High cholesterol   . Chronic pain     lower back  . Dementia   . Tremor     familiar  . Swelling of upper lip     tongue  . Weakness of both legs   . Weakness generalized 12/22/2012  . Dementia with behavioral disturbance 04/18/2013   Past Surgical History  Procedure Laterality Date  . Back surgery    . Total knee arthroplasty    . Hernia repair  1985  . Laminectomy  2006  . Colon resection  1996  . Dental surgery  3/14    all upper teeth removed   Family History  Problem Relation Age of Onset  . Tremor Maternal Grandfather    History  Substance Use Topics  . Smoking status: Never  Smoker   . Smokeless tobacco: Never Used     Comment: quit in 1965  . Alcohol Use: No    Review of Systems  Respiratory: Negative for cough.   Gastrointestinal: Positive for nausea, vomiting, abdominal pain and anorexia. Negative for diarrhea and constipation.  Genitourinary: Negative for dysuria.  All other systems reviewed and are negative.      Allergies  Ace inhibitors and Penicillins  Home Medications   Current Outpatient Rx  Name  Route  Sig  Dispense  Refill  . ascorbic acid (VITAMIN C) 1000 MG tablet   Oral   Take 1,000 mg by mouth daily.         Marland Kitchen. aspirin 325 MG tablet   Oral   Take 325 mg by mouth daily.         . baclofen (LIORESAL) 10 MG tablet   Oral   Take 10 mg by mouth at bedtime.         Marland Kitchen. BYSTOLIC 10 MG tablet   Oral   Take 10 mg by mouth daily.          . Calcium Carbonate-Vitamin D (CALCIUM + D PO)   Oral   Take 1 tablet by mouth daily.          . cholecalciferol (VITAMIN D) 1000 UNITS tablet  Oral   Take 1,000 Units by mouth daily.           . clonazePAM (KLONOPIN) 1 MG tablet   Oral   Take 0.5-1 mg by mouth at bedtime as needed for anxiety.         . cyanocobalamin 500 MCG tablet   Oral   Take 500 mcg by mouth daily.         . diclofenac sodium (VOLTAREN) 1 % GEL   Topical   Apply 2 g topically 4 (four) times daily as needed (Pain).         Marland Kitchen HYDROcodone-acetaminophen (NORCO/VICODIN) 5-325 MG per tablet   Oral   Take 1 tablet by mouth every 6 (six) hours as needed for pain.         . Hypromellose (GENTEAL) 0.3 % SOLN   Both Eyes   Place 1 drop into both eyes 3 (three) times daily as needed (dry eyes).         Marland Kitchen lidocaine (LIDODERM) 5 %   Transdermal   Place 1 patch onto the skin daily as needed (Back pain). Remove & Discard patch within 12 hours or as directed by MD         . meclizine (ANTIVERT) 25 MG tablet   Oral   Take 25 mg by mouth 3 (three) times daily as needed for dizziness.          .  Multiple Vitamin (MULITIVITAMIN WITH MINERALS) TABS   Oral   Take 1 tablet by mouth daily.           Marland Kitchen EPINEPHrine (EPIPEN) 0.3 mg/0.3 mL DEVI   Intramuscular   Inject 0.3 mg into the muscle once. As needed for anaphylaxis (unknown cause)          BP 170/92  Pulse 71  Temp(Src) 98.8 F (37.1 C) (Oral)  Resp 18  SpO2 98% Physical Exam  Nursing note and vitals reviewed. Constitutional: He appears well-developed and well-nourished. No distress.  HENT:  Head: Normocephalic and atraumatic.  Mouth/Throat: Oropharynx is clear and moist.  Eyes: Conjunctivae and EOM are normal. Pupils are equal, round, and reactive to light.  Neck: Normal range of motion. Neck supple.  Cardiovascular: Normal rate, regular rhythm and intact distal pulses.   No murmur heard. Pulmonary/Chest: Effort normal and breath sounds normal. No respiratory distress. He has no wheezes. He has no rales.  Abdominal: Soft. Normal appearance. He exhibits no distension. There is no tenderness. There is no rebound, no guarding and no CVA tenderness.  Musculoskeletal: Normal range of motion. He exhibits no edema and no tenderness.  Neurological: He is alert.  Oriented to person.  Left sided lower ext weakness 3/5.  Some difficulty communicating  Skin: Skin is warm and dry. No rash noted. No erythema.  Psychiatric: He has a normal mood and affect. His behavior is normal.    ED Course  Procedures (including critical care time) Labs Review Labs Reviewed  COMPREHENSIVE METABOLIC PANEL - Abnormal; Notable for the following:    Glucose, Bld 163 (*)    GFR calc non Af Amer 89 (*)    All other components within normal limits  LIPASE, BLOOD - Abnormal; Notable for the following:    Lipase 201 (*)    All other components within normal limits  URINALYSIS, ROUTINE W REFLEX MICROSCOPIC - Abnormal; Notable for the following:    Glucose, UA 250 (*)    Hgb urine dipstick MODERATE (*)    All other  components within normal  limits  CBC WITH DIFFERENTIAL  URINE MICROSCOPIC-ADD ON  I-STAT TROPOININ, ED   Imaging Review Ct Abdomen Pelvis Wo Contrast  08/03/2013   CLINICAL DATA:  General abdominal pain, nausea and vomiting. Elevated lipase.  EXAM: CT ABDOMEN AND PELVIS WITHOUT CONTRAST  TECHNIQUE: Multidetector CT imaging of the abdomen and pelvis was performed following the standard protocol without intravenous contrast.  COMPARISON:  CT of the abdomen and pelvis 05/30/2005.  FINDINGS: Lung Bases: Massive hiatal hernia with, significantly increased in size compared to the prior study. The majority of the stomach is now intrathoracic.  Abdomen/Pelvis: Czaja locule of gas within the nondependent portion of the gallbladder, likely gas within a non calcified gallstone. Gallbladder is moderately distended. No abnormal gallbladder wall thickening or pericholecystic fluid to suggest acute cholecystitis at this time. The unenhanced appearance of the liver, pancreas, spleen, bilateral adrenal glands and the right kidney is unremarkable. Numerous low-attenuation lesions in the left renal pelvis are most compatible with parapelvic cysts, but are incompletely evaluated on today's non contrast CT examination. No abnormal calcifications within the collecting system of either kidney, along the course of either ureter, or within the lumen of the urinary bladder.  No significant volume of ascites. No pneumoperitoneum. No pathologic distention of Garron bowel. No definite lymphadenopathy identified within the abdomen or pelvis on today's non contrast CT examination. Atherosclerosis throughout the abdominal and pelvic vasculature, without evidence of aneurysm. Prostate gland remains enlarged, measuring up to 4.9 x 5.7 cm on today's examination. Urinary bladder is unremarkable in appearance. Multiple surgical clips along the posterior aspect of the pelvic side wall.  Musculoskeletal: Lucent lesion in the intertrochanteric region of the right femur,  similar to remote prior study, presumably benign. There are no aggressive appearing lytic or blastic lesions noted in the visualized portions of the skeleton.  IMPRESSION: 1. No acute findings in the abdomen or pelvis to account for the patient's symptoms. 2. Marked interval enlargement of a massive hiatal hernia. The stomach is essentially nearly completely intrathoracic on today's examination. 3. Koci locule of gas non dependently within the lumen of the gallbladder is presumably a Egner amount of gas within a non calcified gallstone. No current findings to suggest acute cholecystitis at this time. 4. Atherosclerosis. 5. Additional incidental findings, as above.   Electronically Signed   By: Trudie Reed M.D.   On: 08/03/2013 05:12   US Abdomen Limited Ruq  08/03/2013   CLINICAL DATA:  Right-sided abdominal pain  EXAM: US ABDOMEN LIMITED - RIGHT UPPER QUADRANT  COMPARISON:  None.  FINDINGS: Gallbladder:  Well distended with multiple Kelleher stones and gallbladder sludge within. A negative sonographic Eulah Pont sign is noted. Edematous changes in the gallbladder wall are noted. The wall is thickened a 6 mm.  Common bile duct:  Diameter: 7 mm although within normal limits for the patient's given age.  Liver:  No focal lesion identified. Within normal limits in parenchymal echogenicity.  IMPRESSION: Well distended gallbladder with evidence of gallstones and sludge within. Gallbladder wall thickening and edema is noted. These changes may still be related tracheal cholecystitis although negative sonographic Eulah Pont sign is noted.   Electronically Signed   By: Alcide Clever M.D.   On: 08/03/2013 07:49     EKG Interpretation   Date/Time:  Thursday August 03 2013 01:20:51 EDT Ventricular Rate:  69 PR Interval:  173 QRS Duration: 80 QT Interval:  386 QTC Calculation: 413 R Axis:   55 Text Interpretation:  Sinus rhythm  Abnormal R-wave progression, early  transition No significant change since last tracing  Confirmed by [facility]   MD, Alphonzo Lemmings ([id number]) on 08/03/2013 4:15:43 AM      MDM   Final diagnoses:  Gallstones    Patient with the abrupt onset of abdominal pain that started approximately 3 hours prior to arrival with vomiting. He is vague about his description of the pain but it appears to shoot into his back. Family member states he had a normal day and eating his normal meals without any difficulty and then suddenly developed the pain. He has good pulses in his lower strandy without signs of vascular compromise. No reproducible significant abdominal pain on exam. No pulsating abdominal masses concerning for ruptured AAA or dissection.   Patient symptoms are suggestive of a kidney stone versus cholecystitis given the abrupt onset of pain. After 1 dose of IV fentanyl patient's pain has resolved he is feeling much better with no further vomiting. Her symptoms concerning for cardiac etiology at this time. He with too numerous to count red blood cells but no signs of infection and no prior history of kidney stones. CT showed no evidence of stones or hydronephrosis the patient did have evidence of bladder distention and one stone present. His lipase was 210 normal LFTs.  Right upper quadrant ultrasound pending for further evaluation however patient is looking and feeling much better and if testing is otherwise within normal limits feel that he can by mouth challenge can go home with pain control when necessary followup with Gen. Surgery  8:16 AM U/S showing numerous gallstones but neg murphy's sign.  On repeat exam pt feeling better and has no RUQ pain currently.  Will po challenge to ensure no return of pain and f/u with surgery.  Wife present and in agreement.  Gwyneth Sprout, MD 08/03/13 [id number]  Gwyneth Sprout, MD 08/03/13 [phone]

## 2013-08-03 NOTE — ED Notes (Signed)
Brought in by EMS from home with c/o generalized abdominal pain with nausea and vomiting.  Per EMS, pt reported that he has been vomiting "continuously for 3 hours since 9pm last night".  Pt also c/o generalized weakness.

## 2013-08-03 NOTE — ED Notes (Signed)
Bed: [id number] Expected date:  Expected time:  Means of arrival:  Comments: EMS/78 yo with vomiting since yesterday-paralyzed

## 2013-08-03 NOTE — Discharge Instructions (Signed)
Cholelithiasis °Cholelithiasis (also called gallstones) is a form of gallbladder disease in which gallstones form in your gallbladder. The gallbladder is an organ that stores bile made in the liver, which helps digest fats. Gallstones begin as Knotts crystals and slowly grow into stones. Gallstone pain occurs when the gallbladder spasms and a gallstone is blocking the duct. Pain can also occur when a stone passes out of the duct.  °RISK FACTORS °· Being male.   °· Having multiple pregnancies. Health care providers sometimes advise removing diseased gallbladders before future pregnancies.   °· Being obese. °· Eating a diet heavy in fried foods and fat.   °· Being older than 60 years and increasing age.   °· Prolonged use of medicines containing male hormones.   °· Having diabetes mellitus.   °· Rapidly losing weight.   °· Having a family history of gallstones (heredity).   °SYMPTOMS °· Nausea.   °· Vomiting. °· Abdominal pain.   °· Yellowing of the skin (jaundice).   °· Sudden pain. It may persist from several minutes to several hours. °· Fever.   °· Tenderness to the touch.  °In some cases, when gallstones do not move into the bile duct, people have no pain or symptoms. These are called "silent" gallstones.  °TREATMENT °Silent gallstones do not need treatment. In severe cases, emergency surgery may be required. Options for treatment include: °· Surgery to remove the gallbladder. This is the most common treatment. °· Medicines. These do not always work and may take 6 12 months or more to work. °· Shock wave treatment (extracorporeal biliary lithotripsy). In this treatment an ultrasound machine sends shock waves to the gallbladder to break gallstones into smaller pieces that can pass into the intestines or be dissolved by medicine. °HOME CARE INSTRUCTIONS  °· Only take over-the-counter or prescription medicines for pain, discomfort, or fever as directed by your health care provider.   °· Follow a low-fat diet until  seen again by your health care provider. Fat causes the gallbladder to contract, which can result in pain.   °· Follow up with your health care provider as directed. Attacks are almost always recurrent and surgery is usually required for permanent treatment.   °SEEK IMMEDIATE MEDICAL CARE IF:  °· Your pain increases and is not controlled by medicines.   °· You have a fever or persistent symptoms for more than 2 3 days.   °· You have a fever and your symptoms suddenly get worse.   °· You have persistent nausea and vomiting.   °MAKE SURE YOU:  °· Understand these instructions. °· Will watch your condition. °· Will get help right away if you are not doing well or get worse. °Document Released: 04/23/2005 Document Revised: 12/28/2012 Document Reviewed: 10/19/2012 °[employer]® Patient Information ©2014 [employer]. ° °

## 2013-08-04 ENCOUNTER — Encounter (HOSPITAL_COMMUNITY)
Admission: EM | Disposition: A | Discharge status: Skilled Nursing Facility | Payer: Self-pay | Source: Home / Self Care | Attending: Internal Medicine

## 2013-08-04 ENCOUNTER — Encounter (HOSPITAL_COMMUNITY): Payer: Self-pay | Admitting: Emergency Medicine

## 2013-08-04 ENCOUNTER — Inpatient Hospital Stay (HOSPITAL_COMMUNITY)
Admission: EM | Admit: 2013-08-04 | Discharge: 2013-08-09 | DRG: 444 | Disposition: A | Discharge status: Skilled Nursing Facility | Payer: Medicare Other | Attending: Internal Medicine | Admitting: Internal Medicine

## 2013-08-04 ENCOUNTER — Emergency Department (HOSPITAL_COMMUNITY): Payer: Medicare Other

## 2013-08-04 DIAGNOSIS — F03918 Unspecified dementia, unspecified severity, with other behavioral disturbance: Secondary | ICD-10-CM | POA: Diagnosis present

## 2013-08-04 DIAGNOSIS — K8010 Calculus of gallbladder with chronic cholecystitis without obstruction: Principal | ICD-10-CM | POA: Diagnosis present

## 2013-08-04 DIAGNOSIS — Z9049 Acquired absence of other specified parts of digestive tract: Secondary | ICD-10-CM

## 2013-08-04 DIAGNOSIS — K819 Cholecystitis, unspecified: Secondary | ICD-10-CM

## 2013-08-04 DIAGNOSIS — R1011 Right upper quadrant pain: Secondary | ICD-10-CM

## 2013-08-04 DIAGNOSIS — F341 Dysthymic disorder: Secondary | ICD-10-CM | POA: Diagnosis present

## 2013-08-04 DIAGNOSIS — G8190 Hemiplegia, unspecified affecting unspecified side: Secondary | ICD-10-CM | POA: Diagnosis present

## 2013-08-04 DIAGNOSIS — G250 Essential tremor: Secondary | ICD-10-CM | POA: Diagnosis present

## 2013-08-04 DIAGNOSIS — F0391 Unspecified dementia with behavioral disturbance: Secondary | ICD-10-CM | POA: Diagnosis present

## 2013-08-04 DIAGNOSIS — Z860100 Personal history of colon polyps, unspecified: Secondary | ICD-10-CM

## 2013-08-04 DIAGNOSIS — E872 Acidosis, unspecified: Secondary | ICD-10-CM | POA: Diagnosis present

## 2013-08-04 DIAGNOSIS — Z85038 Personal history of other malignant neoplasm of large intestine: Secondary | ICD-10-CM

## 2013-08-04 DIAGNOSIS — N400 Enlarged prostate without lower urinary tract symptoms: Secondary | ICD-10-CM | POA: Diagnosis present

## 2013-08-04 DIAGNOSIS — E46 Unspecified protein-calorie malnutrition: Secondary | ICD-10-CM | POA: Diagnosis present

## 2013-08-04 DIAGNOSIS — Z7982 Long term (current) use of aspirin: Secondary | ICD-10-CM

## 2013-08-04 DIAGNOSIS — Z96659 Presence of unspecified artificial knee joint: Secondary | ICD-10-CM

## 2013-08-04 DIAGNOSIS — Z87891 Personal history of nicotine dependence: Secondary | ICD-10-CM

## 2013-08-04 DIAGNOSIS — K8020 Calculus of gallbladder without cholecystitis without obstruction: Secondary | ICD-10-CM

## 2013-08-04 DIAGNOSIS — R29810 Facial weakness: Secondary | ICD-10-CM | POA: Diagnosis present

## 2013-08-04 DIAGNOSIS — R1310 Dysphagia, unspecified: Secondary | ICD-10-CM | POA: Diagnosis present

## 2013-08-04 DIAGNOSIS — IMO0002 Reserved for concepts with insufficient information to code with codable children: Secondary | ICD-10-CM

## 2013-08-04 DIAGNOSIS — E785 Hyperlipidemia, unspecified: Secondary | ICD-10-CM | POA: Diagnosis present

## 2013-08-04 DIAGNOSIS — A419 Sepsis, unspecified organism: Secondary | ICD-10-CM | POA: Diagnosis present

## 2013-08-04 DIAGNOSIS — R197 Diarrhea, unspecified: Secondary | ICD-10-CM | POA: Diagnosis present

## 2013-08-04 DIAGNOSIS — D72829 Elevated white blood cell count, unspecified: Secondary | ICD-10-CM

## 2013-08-04 DIAGNOSIS — M479 Spondylosis, unspecified: Secondary | ICD-10-CM | POA: Diagnosis present

## 2013-08-04 DIAGNOSIS — K219 Gastro-esophageal reflux disease without esophagitis: Secondary | ICD-10-CM | POA: Diagnosis present

## 2013-08-04 DIAGNOSIS — G252 Other specified forms of tremor: Secondary | ICD-10-CM

## 2013-08-04 DIAGNOSIS — Z807 Family history of other malignant neoplasms of lymphoid, hematopoietic and related tissues: Secondary | ICD-10-CM

## 2013-08-04 DIAGNOSIS — I10 Essential (primary) hypertension: Secondary | ICD-10-CM | POA: Diagnosis present

## 2013-08-04 DIAGNOSIS — H8120 Vestibular neuronitis, unspecified ear: Secondary | ICD-10-CM | POA: Diagnosis present

## 2013-08-04 DIAGNOSIS — E7800 Pure hypercholesterolemia, unspecified: Secondary | ICD-10-CM | POA: Diagnosis present

## 2013-08-04 DIAGNOSIS — Z880 Allergy status to penicillin: Secondary | ICD-10-CM

## 2013-08-04 DIAGNOSIS — R109 Unspecified abdominal pain: Secondary | ICD-10-CM

## 2013-08-04 DIAGNOSIS — G2581 Restless legs syndrome: Secondary | ICD-10-CM | POA: Diagnosis present

## 2013-08-04 DIAGNOSIS — Z79899 Other long term (current) drug therapy: Secondary | ICD-10-CM

## 2013-08-04 DIAGNOSIS — K810 Acute cholecystitis: Secondary | ICD-10-CM

## 2013-08-04 DIAGNOSIS — G609 Hereditary and idiopathic neuropathy, unspecified: Secondary | ICD-10-CM | POA: Diagnosis present

## 2013-08-04 DIAGNOSIS — Z87820 Personal history of traumatic brain injury: Secondary | ICD-10-CM

## 2013-08-04 DIAGNOSIS — R471 Dysarthria and anarthria: Secondary | ICD-10-CM | POA: Diagnosis present

## 2013-08-04 DIAGNOSIS — K449 Diaphragmatic hernia without obstruction or gangrene: Secondary | ICD-10-CM | POA: Diagnosis present

## 2013-08-04 DIAGNOSIS — R822 Biliuria: Secondary | ICD-10-CM | POA: Diagnosis present

## 2013-08-04 LAB — CBC WITH DIFFERENTIAL/PLATELET
Basophils Absolute: 0 10*3/uL (ref 0.0–0.1)
Basophils Relative: 0 % (ref 0–1)
EOS PCT: 0 % (ref 0–5)
Eosinophils Absolute: 0 10*3/uL (ref 0.0–0.7)
HEMATOCRIT: 47.5 % (ref 39.0–52.0)
Hemoglobin: 16.3 g/dL (ref 13.0–17.0)
LYMPHS ABS: 0.5 10*3/uL — AB (ref 0.7–4.0)
Lymphocytes Relative: 2 % — ABNORMAL LOW (ref 12–46)
MCH: 27 pg (ref 26.0–34.0)
MCHC: 34.3 g/dL (ref 30.0–36.0)
MCV: 78.6 fL (ref 78.0–100.0)
MONOS PCT: 12 % (ref 3–12)
Monocytes Absolute: 3.2 10*3/uL — ABNORMAL HIGH (ref 0.1–1.0)
Neutro Abs: 23 10*3/uL — ABNORMAL HIGH (ref 1.7–7.7)
Neutrophils Relative %: 86 % — ABNORMAL HIGH (ref 43–77)
Platelets: 283 10*3/uL (ref 150–400)
RBC: 6.04 MIL/uL — AB (ref 4.22–5.81)
RDW: 15.3 % (ref 11.5–15.5)
WBC: 26.7 10*3/uL — ABNORMAL HIGH (ref 4.0–10.5)

## 2013-08-04 LAB — URINALYSIS, ROUTINE W REFLEX MICROSCOPIC
Hgb urine dipstick: NEGATIVE
KETONES UR: NEGATIVE mg/dL
LEUKOCYTES UA: NEGATIVE
Nitrite: NEGATIVE
Protein, ur: 30 mg/dL — AB
Specific Gravity, Urine: 1.04 — ABNORMAL HIGH (ref 1.005–1.030)
Urobilinogen, UA: 1 mg/dL (ref 0.0–1.0)
pH: 5.5 (ref 5.0–8.0)

## 2013-08-04 LAB — COMPREHENSIVE METABOLIC PANEL
ALK PHOS: 36 U/L — AB (ref 39–117)
ALT: 19 U/L (ref 0–53)
ALT: 34 U/L (ref 0–53)
AST: 27 U/L (ref 0–37)
AST: 34 U/L (ref 0–37)
Albumin: 3.5 g/dL (ref 3.5–5.2)
Albumin: 2.6 g/dL — ABNORMAL LOW (ref 3.5–5.2)
Alkaline Phosphatase: 45 U/L (ref 39–117)
BILIRUBIN TOTAL: 1.3 mg/dL — AB (ref 0.3–1.2)
BILIRUBIN TOTAL: 0.9 mg/dL (ref 0.3–1.2)
BUN: 12 mg/dL (ref 6–23)
BUN: 11 mg/dL (ref 6–23)
CHLORIDE: 97 meq/L (ref 96–112)
CHLORIDE: 103 meq/L (ref 96–112)
CO2: 20 meq/L (ref 19–32)
CO2: 19 meq/L (ref 19–32)
CREATININE: 0.59 mg/dL (ref 0.50–1.35)
Calcium: 9.3 mg/dL (ref 8.4–10.5)
Calcium: 8.2 mg/dL — ABNORMAL LOW (ref 8.4–10.5)
Creatinine, Ser: 0.63 mg/dL (ref 0.50–1.35)
GFR calc Af Amer: >90 mL/min (ref >90)
GFR calc Af Amer: >90 mL/min (ref >90)
GFR calc non Af Amer: >90 mL/min (ref >90)
Glucose, Bld: 192 mg/dL — ABNORMAL HIGH (ref 70–99)
Glucose, Bld: 123 mg/dL — ABNORMAL HIGH (ref 70–99)
POTASSIUM: 4.2 meq/L (ref 3.7–5.3)
Potassium: 4.2 mEq/L (ref 3.7–5.3)
Sodium: 133 mEq/L — ABNORMAL LOW (ref 137–147)
Sodium: 133 mEq/L — ABNORMAL LOW (ref 137–147)
Total Protein: 6.5 g/dL (ref 6.0–8.3)
Total Protein: 5.2 g/dL — ABNORMAL LOW (ref 6.0–8.3)

## 2013-08-04 LAB — CBC
HEMATOCRIT: 43.7 % (ref 39.0–52.0)
Hemoglobin: 14.9 g/dL (ref 13.0–17.0)
MCH: 27.1 pg (ref 26.0–34.0)
MCHC: 34.1 g/dL (ref 30.0–36.0)
MCV: 79.6 fL (ref 78.0–100.0)
Platelets: 218 10*3/uL (ref 150–400)
RBC: 5.49 MIL/uL (ref 4.22–5.81)
RDW: 15.6 % — AB (ref 11.5–15.5)
WBC: 22.1 10*3/uL — AB (ref 4.0–10.5)

## 2013-08-04 LAB — URINE MICROSCOPIC-ADD ON

## 2013-08-04 LAB — I-STAT CG4 LACTIC ACID, ED: Lactic Acid, Venous: 2.36 mmol/L — ABNORMAL HIGH (ref 0.5–2.2)

## 2013-08-04 LAB — LACTIC ACID, PLASMA: LACTIC ACID, VENOUS: 1.6 mmol/L (ref 0.5–2.2)

## 2013-08-04 LAB — LIPASE, BLOOD
LIPASE: 10 U/L — AB (ref 11–59)
Lipase: 8 U/L — ABNORMAL LOW (ref 11–59)

## 2013-08-04 SURGERY — LAPAROSCOPIC CHOLECYSTECTOMY WITH INTRAOPERATIVE CHOLANGIOGRAM
Anesthesia: General

## 2013-08-04 MED ORDER — POTASSIUM CHLORIDE IN NACL 20-0.9 MEQ/L-% IV SOLN
INTRAVENOUS | Status: DC
  Administered 2013-08-04 – 2013-08-05 (×2): via INTRAVENOUS
  Filled 2013-08-04 (×4): qty 1000

## 2013-08-04 MED ORDER — SIMVASTATIN 20 MG PO TABS
20.0000 mg | ORAL_TABLET | Freq: Every day | ORAL | Status: DC
  Administered 2013-08-04 – 2013-08-07 (×4): 20 mg via ORAL
  Filled 2013-08-04 (×6): qty 1

## 2013-08-04 MED ORDER — CIPROFLOXACIN IN D5W 400 MG/200ML IV SOLN
400.0000 mg | Freq: Once | INTRAVENOUS | Status: AC
  Administered 2013-08-04: 400 mg via INTRAVENOUS
  Filled 2013-08-04: qty 200

## 2013-08-04 MED ORDER — HYDRALAZINE HCL 20 MG/ML IJ SOLN
5.0000 mg | Freq: Four times a day (QID) | INTRAMUSCULAR | Status: DC | PRN
  Filled 2013-08-04: qty 0.25

## 2013-08-04 MED ORDER — MECLIZINE HCL 25 MG PO TABS
25.0000 mg | ORAL_TABLET | Freq: Three times a day (TID) | ORAL | Status: DC | PRN
  Filled 2013-08-04: qty 1

## 2013-08-04 MED ORDER — SODIUM CHLORIDE 0.9 % IV BOLUS (SEPSIS)
1000.0000 mL | Freq: Once | INTRAVENOUS | Status: AC
  Administered 2013-08-04: 1000 mL via INTRAVENOUS

## 2013-08-04 MED ORDER — HYPROMELLOSE 0.3 % OP SOLN: 1.0000 [drp] | Freq: Three times a day (TID) | OPHTHALMIC | Status: DC | PRN

## 2013-08-04 MED ORDER — HYDROCODONE-ACETAMINOPHEN 5-325 MG PO TABS
1.0000 | ORAL_TABLET | ORAL | Status: DC | PRN
  Administered 2013-08-05: 1 via ORAL
  Filled 2013-08-04: qty 1

## 2013-08-04 MED ORDER — IOHEXOL 300 MG/ML  SOLN
100.0000 mL | Freq: Once | INTRAMUSCULAR | Status: AC | PRN
  Administered 2013-08-04: 100 mL via INTRAVENOUS

## 2013-08-04 MED ORDER — CLONAZEPAM 0.5 MG PO TABS
0.5000 mg | ORAL_TABLET | Freq: Two times a day (BID) | ORAL | Status: DC | PRN
  Administered 2013-08-05 – 2013-08-07 (×2): 0.5 mg via ORAL
  Filled 2013-08-04 (×2): qty 1

## 2013-08-04 MED ORDER — NEBIVOLOL HCL 10 MG PO TABS
10.0000 mg | ORAL_TABLET | Freq: Every day | ORAL | Status: DC
  Administered 2013-08-04 – 2013-08-09 (×6): 10 mg via ORAL
  Filled 2013-08-04 (×6): qty 1

## 2013-08-04 MED ORDER — FAMOTIDINE IN NACL 20-0.9 MG/50ML-% IV SOLN
20.0000 mg | Freq: Once | INTRAVENOUS | Status: AC
  Administered 2013-08-04: 20 mg via INTRAVENOUS
  Filled 2013-08-04: qty 50

## 2013-08-04 MED ORDER — ASPIRIN 325 MG PO TABS
325.0000 mg | ORAL_TABLET | Freq: Every day | ORAL | Status: DC
  Administered 2013-08-04 – 2013-08-09 (×6): 325 mg via ORAL
  Filled 2013-08-04 (×6): qty 1

## 2013-08-04 MED ORDER — BACLOFEN 20 MG PO TABS
20.0000 mg | ORAL_TABLET | Freq: Every day | ORAL | Status: DC
  Administered 2013-08-04 – 2013-08-09 (×6): 20 mg via ORAL
  Filled 2013-08-04 (×6): qty 1

## 2013-08-04 MED ORDER — ONDANSETRON HCL 4 MG PO TABS: 4.0000 mg | ORAL_TABLET | Freq: Four times a day (QID) | ORAL | Status: DC | PRN

## 2013-08-04 MED ORDER — LINACLOTIDE 145 MCG PO CAPS
145.0000 ug | ORAL_CAPSULE | Freq: Every day | ORAL | Status: DC
  Administered 2013-08-04 – 2013-08-09 (×6): 145 ug via ORAL
  Filled 2013-08-04 (×6): qty 1

## 2013-08-04 MED ORDER — ONDANSETRON HCL 4 MG/2ML IJ SOLN
4.0000 mg | Freq: Four times a day (QID) | INTRAMUSCULAR | Status: DC | PRN
  Administered 2013-08-09: 4 mg via INTRAVENOUS
  Filled 2013-08-04: qty 2

## 2013-08-04 MED ORDER — CIPROFLOXACIN IN D5W 400 MG/200ML IV SOLN
400.0000 mg | Freq: Two times a day (BID) | INTRAVENOUS | Status: DC
  Administered 2013-08-04 – 2013-08-06 (×6): 400 mg via INTRAVENOUS
  Filled 2013-08-04 (×8): qty 200

## 2013-08-04 MED ORDER — MORPHINE SULFATE 4 MG/ML IJ SOLN
4.0000 mg | INTRAMUSCULAR | Status: DC | PRN
  Administered 2013-08-04 (×2): 4 mg via INTRAVENOUS
  Filled 2013-08-04 (×3): qty 1

## 2013-08-04 MED ORDER — METRONIDAZOLE IN NACL 5-0.79 MG/ML-% IV SOLN
500.0000 mg | Freq: Three times a day (TID) | INTRAVENOUS | Status: DC
  Administered 2013-08-04 – 2013-08-07 (×9): 500 mg via INTRAVENOUS
  Filled 2013-08-04 (×12): qty 100

## 2013-08-04 MED ORDER — POLYETHYLENE GLYCOL 3350 17 G PO PACK
17.0000 g | PACK | Freq: Every day | ORAL | Status: DC | PRN
  Filled 2013-08-04: qty 1

## 2013-08-04 MED ORDER — NIACIN ER 500 MG PO TBCR
500.0000 mg | EXTENDED_RELEASE_TABLET | Freq: Every day | ORAL | Status: DC
  Administered 2013-08-04 – 2013-08-08 (×4): 500 mg via ORAL
  Filled 2013-08-04 (×6): qty 1

## 2013-08-04 MED ORDER — POLYVINYL ALCOHOL 1.4 % OP SOLN
1.0000 [drp] | OPHTHALMIC | Status: DC | PRN
  Filled 2013-08-04: qty 15

## 2013-08-04 MED ORDER — SODIUM CHLORIDE 0.9 % IV SOLN
Freq: Once | INTRAVENOUS | Status: AC
Start: 2013-08-04 — End: 2013-08-04
  Administered 2013-08-04: 07:00:00 via INTRAVENOUS

## 2013-08-04 MED ORDER — ONDANSETRON HCL 4 MG/2ML IJ SOLN
4.0000 mg | Freq: Once | INTRAMUSCULAR | Status: AC
  Administered 2013-08-04: 4 mg via INTRAVENOUS
  Filled 2013-08-04: qty 2

## 2013-08-04 MED ORDER — CYANOCOBALAMIN 500 MCG PO TABS
500.0000 ug | ORAL_TABLET | Freq: Every day | ORAL | Status: DC
  Administered 2013-08-04 – 2013-08-09 (×6): 500 ug via ORAL
  Filled 2013-08-04 (×6): qty 1

## 2013-08-04 NOTE — ED Provider Notes (Addendum)
CSN: [id number]     Arrival date & time 08/04/13  0505 History   First MD Initiated Contact with Patient 08/04/13 0542     Chief Complaint  Patient presents with  . Abdominal Pain     (Consider location/radiation/quality/duration/timing/severity/associated sxs/prior Treatment) HPI Comments: Pt comes in to the ER with cc of abdominal pain. Pt is a poor historian - wife assists with the hx. Pt has right sided hemiplegia, TBI, HL. He had come in with epigastric pain y'day. Had a CT non contrast and an US done - noted to have hiatal hernia and cholelithiasis. Pt woke up this AM, around 4:00 am with severe abd pain. Pt took hydrocodone with minimal relief. + emesis at home. Pain is located in the epigastrium and right side, non radiating.  Patient is a 78 y.o. male presenting with abdominal pain. The history is provided by the patient.  Abdominal Pain Associated symptoms: nausea and vomiting   Associated symptoms: no chest pain, no constipation, no cough, no dysuria, no fever, no hematuria and no shortness of breath     Past Medical History  Diagnosis Date  . Hemiplegia   . Vertigo   . Hypertension   . Restless leg syndrome   . TBI (traumatic brain injury)     WITH LEFT HEMISPHERIC BLEED  . Depression with anxiety   . High cholesterol   . Chronic pain     lower back  . Dementia   . Tremor     familiar  . Swelling of upper lip     tongue  . Weakness of both legs   . Weakness generalized 12/22/2012  . Dementia with behavioral disturbance 04/18/2013   Past Surgical History  Procedure Laterality Date  . Back surgery    . Total knee arthroplasty    . Hernia repair  1985  . Laminectomy  2006  . Colon resection  1996  . Dental surgery  3/14    all upper teeth removed   Family History  Problem Relation Age of Onset  . Tremor Maternal Grandfather    History  Substance Use Topics  . Smoking status: Never Smoker   . Smokeless tobacco: Never Used     Comment: quit in 1965  .  Alcohol Use: No    Review of Systems  Constitutional: Negative for fever.  Respiratory: Negative for cough and shortness of breath.   Cardiovascular: Negative for chest pain.  Gastrointestinal: Positive for nausea, vomiting and abdominal pain. Negative for constipation.  Genitourinary: Negative for dysuria and hematuria.  All other systems reviewed and are negative.      Allergies  Ace inhibitors and Penicillins  Home Medications   Current Outpatient Rx  Name  Route  Sig  Dispense  Refill  . ascorbic acid (VITAMIN C) 1000 MG tablet   Oral   Take 1,000 mg by mouth daily.         Marland Kitchen aspirin 325 MG tablet   Oral   Take 325 mg by mouth daily.         . baclofen (LIORESAL) 10 MG tablet   Oral   Take 10 mg by mouth at bedtime.         Marland Kitchen BYSTOLIC 10 MG tablet   Oral   Take 10 mg by mouth daily.          . Calcium Carbonate-Vitamin D (CALCIUM + D PO)   Oral   Take 1 tablet by mouth daily.          Marland Kitchen  cholecalciferol (VITAMIN D) 1000 UNITS tablet   Oral   Take 1,000 Units by mouth daily.           . clonazePAM (KLONOPIN) 1 MG tablet   Oral   Take 0.5-1 mg by mouth at bedtime as needed for anxiety.         . cyanocobalamin 500 MCG tablet   Oral   Take 500 mcg by mouth daily.         . diclofenac sodium (VOLTAREN) 1 % GEL   Topical   Apply 2 g topically 4 (four) times daily as needed (Pain).         Marland Kitchen EPINEPHrine (EPIPEN) 0.3 mg/0.3 mL DEVI   Intramuscular   Inject 0.3 mg into the muscle once. As needed for anaphylaxis (unknown cause)         . HYDROcodone-acetaminophen (NORCO/VICODIN) 5-325 MG per tablet   Oral   Take 1 tablet by mouth every 6 (six) hours as needed for pain.         . Hypromellose (GENTEAL) 0.3 % SOLN   Both Eyes   Place 1 drop into both eyes 3 (three) times daily as needed (dry eyes).         Marland Kitchen lidocaine (LIDODERM) 5 %   Transdermal   Place 1 patch onto the skin daily as needed (Back pain). Remove & Discard patch  within 12 hours or as directed by MD         . meclizine (ANTIVERT) 25 MG tablet   Oral   Take 25 mg by mouth 3 (three) times daily as needed for dizziness.          . Multiple Vitamin (MULITIVITAMIN WITH MINERALS) TABS   Oral   Take 1 tablet by mouth daily.           . ondansetron (ZOFRAN) 4 MG tablet   Oral   Take 1 tablet (4 mg total) by mouth every 6 (six) hours.   12 tablet   0    BP 144/107  Pulse 88  Temp(Src) 98.7 F (37.1 C) (Oral)  Resp 21  SpO2 94% Physical Exam  Nursing note and vitals reviewed. Constitutional: He is oriented to person, place, and time. He appears well-developed.  HENT:  Head: Normocephalic and atraumatic.  Eyes: Conjunctivae and EOM are normal. Pupils are equal, round, and reactive to light.  Neck: Normal range of motion. Neck supple.  Cardiovascular: Normal rate and regular rhythm.   Pulmonary/Chest: Effort normal and breath sounds normal.  Abdominal: Soft. Bowel sounds are normal. He exhibits no distension. There is tenderness. There is no rebound and no guarding.  Epigastric and RUQ tenderness.    Neurological: He is alert and oriented to person, place, and time.  Skin: Skin is warm.    ED Course  Procedures (including critical care time) Labs Review Labs Reviewed  CBC WITH DIFFERENTIAL - Abnormal; Notable for the following:    WBC 26.7 (*)    RBC 6.04 (*)    Neutrophils Relative % 86 (*)    Lymphocytes Relative 2 (*)    Neutro Abs 23.0 (*)    Lymphs Abs 0.5 (*)    Monocytes Absolute 3.2 (*)    All other components within normal limits  COMPREHENSIVE METABOLIC PANEL - Abnormal; Notable for the following:    Sodium 133 (*)    Glucose, Bld 192 (*)    Total Bilirubin 1.3 (*)    All other components within normal limits  LIPASE,  BLOOD - Abnormal; Notable for the following:    Lipase 10 (*)    All other components within normal limits  I-STAT CG4 LACTIC ACID, ED - Abnormal; Notable for the following:    Lactic Acid, Venous  2.36 (*)    All other components within normal limits  URINALYSIS, ROUTINE W REFLEX MICROSCOPIC   Imaging Review Ct Abdomen Pelvis Wo Contrast  08/03/2013   CLINICAL DATA:  General abdominal pain, nausea and vomiting. Elevated lipase.  EXAM: CT ABDOMEN AND PELVIS WITHOUT CONTRAST  TECHNIQUE: Multidetector CT imaging of the abdomen and pelvis was performed following the standard protocol without intravenous contrast.  COMPARISON:  CT of the abdomen and pelvis 05/30/2005.  FINDINGS: Lung Bases: Massive hiatal hernia with, significantly increased in size compared to the prior study. The majority of the stomach is now intrathoracic.  Abdomen/Pelvis: Hunsinger locule of gas within the nondependent portion of the gallbladder, likely gas within a non calcified gallstone. Gallbladder is moderately distended. No abnormal gallbladder wall thickening or pericholecystic fluid to suggest acute cholecystitis at this time. The unenhanced appearance of the liver, pancreas, spleen, bilateral adrenal glands and the right kidney is unremarkable. Numerous low-attenuation lesions in the left renal pelvis are most compatible with parapelvic cysts, but are incompletely evaluated on today's non contrast CT examination. No abnormal calcifications within the collecting system of either kidney, along the course of either ureter, or within the lumen of the urinary bladder.  No significant volume of ascites. No pneumoperitoneum. No pathologic distention of Wixon bowel. No definite lymphadenopathy identified within the abdomen or pelvis on today's non contrast CT examination. Atherosclerosis throughout the abdominal and pelvic vasculature, without evidence of aneurysm. Prostate gland remains enlarged, measuring up to 4.9 x 5.7 cm on today's examination. Urinary bladder is unremarkable in appearance. Multiple surgical clips along the posterior aspect of the pelvic side wall.  Musculoskeletal: Lucent lesion in the intertrochanteric region of the  right femur, similar to remote prior study, presumably benign. There are no aggressive appearing lytic or blastic lesions noted in the visualized portions of the skeleton.  IMPRESSION: 1. No acute findings in the abdomen or pelvis to account for the patient's symptoms. 2. Marked interval enlargement of a massive hiatal hernia. The stomach is essentially nearly completely intrathoracic on today's examination. 3. Wadel locule of gas non dependently within the lumen of the gallbladder is presumably a Neuharth amount of gas within a non calcified gallstone. No current findings to suggest acute cholecystitis at this time. 4. Atherosclerosis. 5. Additional incidental findings, as above.   Electronically Signed   By: Trudie Reed M.D.   On: 08/03/2013 05:12   Dg Chest Port 1 View  08/04/2013   CLINICAL DATA:  Cough and congestion.  Mid to upper abdominal pain.  EXAM: PORTABLE CHEST - 1 VIEW  COMPARISON:  Chest x-ray 02/15/2010.  FINDINGS: Lung volumes are low. No consolidative airspace disease. No pleural effusions. No evidence of pulmonary edema. Large hiatal hernia. Heart size is within normal limits. The patient is rotated to the right on today's exam, resulting in distortion of the mediastinal contours and reduced diagnostic sensitivity and specificity for mediastinal pathology.  IMPRESSION: 1. No radiographic evidence of acute cardiopulmonary disease.   Electronically Signed   By: Trudie Reed M.D.   On: 08/04/2013 06:33   US Abdomen Limited Ruq  08/03/2013   CLINICAL DATA:  Right-sided abdominal pain  EXAM: US ABDOMEN LIMITED - RIGHT UPPER QUADRANT  COMPARISON:  None.  FINDINGS: Gallbladder:  Well distended with multiple Oren stones and gallbladder sludge within. A negative sonographic Eulah PontMurphy sign is noted. Edematous changes in the gallbladder wall are noted. The wall is thickened a 6 mm.  Common bile duct:  Diameter: 7 mm although within normal limits for the patient's given age.  Liver:  No focal lesion  identified. Within normal limits in parenchymal echogenicity.  IMPRESSION: Well distended gallbladder with evidence of gallstones and sludge within. Gallbladder wall thickening and edema is noted. These changes may still be related tracheal cholecystitis although negative sonographic Eulah PontMurphy sign is noted.   Electronically Signed   By: Alcide CleverMark  Lukens M.D.   On: 08/03/2013 07:49     EKG Interpretation None      MDM   Final diagnoses:  None    Pt comes in with cc of abd pain. Just noted to have cholelithiasis y'day via US, CT showing significant hiatal hernia as well.  DDx includes: Pancreatitis Hepatobiliary pathology including cholecystitis Gastritis/PUD SBO ACS syndrome Aortic Dissection   Based on the hx, exam and the information in front of us - suspect this to be pancreatitis, Cholecystitis, PUD or hernia related.  Xray ordered -and no free air appreciated.  Surgery consulted, and the morning team to see the patient.   Derwood KaplanAnkit Camil Wilhelmsen, MD 08/04/13 0749   8:59 AM Per surgery request, CT abd with iv contrast ordered. [facility] cannot admit till 1 pm. Surgery team notified - they will manage the patient, either in the ER of up on the floor. Dr. Juleen ChinaKohut made aware of the patient. Pt and family is aware of the plan.  Derwood KaplanAnkit Jozie Wulf, MD 08/04/13 [phone]
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
## 2013-08-04 NOTE — Progress Notes (Signed)
   CARE MANAGEMENT ED NOTE 08/04/2013  Patient:  [name],[name]   Account Number:  [id number]  Date Initiated:  08/04/2013  Documentation initiated by:  Edd ArbourGIBBS,KIMBERLY  Subjective/Objective Assessment:   78 yr old male [employer] pt c/o's abdominal pain.  Pt seen in ED yesterday & dx with gallstones-developed severe abdominal pain at ~0400 & took hydrocodone for pain, but states still wanted transport to ED for further evaluation     Subjective/Objective Assessment Detail:   CM consult: Wife, Carney BernJean reports that patient was to start back with PT from [facility].  He is non ambulatory. Wife very supportive.  Wife has private aid that comes twice a week to give patient a bath  Wife informed CM she and pt do not have family in the area and take care of each other  Pcp R Jacky KindleAronson Active with [facility] for HHPT  Dx cholecystitis     Action/Plan:   Cm spoke with pt and wife who agreed to have CM contact [facility] to follow [facility] CM spoke with Venia MinksDebbie barnes [phone] and she will follow pt for [name]/u needs   Action/Plan Detail:   Anticipated DC Date:  08/07/2013     Status Recommendation to Physician:   Result of Recommendation:    Other ED Services  Consult Working Plan    DC Planning Services  [facility]   [facility]  [facility]   Choice offered to / List presented to:  C-1 Patient     [facility]   HH arranged  [facility]      [facility]    Status of service:  Completed, signed off  ED Comments:   ED Comments Detail:

## 2013-08-04 NOTE — ED Notes (Signed)
Per Marlyne BeardsJennings internal medicine will come see pt and will be one to place admission/bed request orders.

## 2013-08-04 NOTE — Consult Note (Signed)
Reason for Consult:  Abdominal pain Referring Physician: Varney Biles, MD   [name] is an 78 y.o. male.  HPI: 78 y/o with complex medical history.  He was seen on 3/21 with slurred speech and major stroke was ruled out.  He was sent home.  He was seen yesterday with, abdominal pain, nausea and vomiting.  Work up at that time shows normal WBC and CMP.  He did have an elevated lipase of 201.  CT of the abdomen shows a massive hiatal hernia, significantly increased from 2007 with most of the stomach in the chest. Pelley locule of gas non dependently within the lumen of the  gallbladder is presumably a Chuang amount of gas within a non  calcified gallstone. US showed: Well distended gallbladder with evidence of gallstones and sludge  within. Gallbladder wall thickening and edema is noted. These changes may still be related tracheal cholecystitis although  negative sonographic Percell Miller sign is noted. No current findings to suggest acute  cholecystitis at this time.  He was treated with antiemetic and pain medicines and sent home.  His wife said he was exhausted with the long day night in the ER and slept most of the day, but was able to eat and was doing fairly well he took some additional pain medicine last PM and something for nausea.  He woke up early this Am screaming with pain.  His wife says it was mid abdomen.  He was brought back to the ER.  Currently he's fairly comfortable,  Pain is rather diffuse all over abdomen.  His wife said for a time it was going to his back from abdomen.  BP up to 144/107 6AM, afebrile. WBC shows WBC is up to 26.7 with left shift, Bilirubin is up and lactic acid is up.  CT shows Pardon right pleural effusion, large HH, distended Gallbladder and gallstones, gall bladder wall thickening and pericholecystic edema.  Increased edema adjacent to duodenum, pancreatic head, colon, stomach, and in porta hepatis.  We were ask to see.  His H/H is up since yesterday also.        Past  Medical History  Diagnosis Date  . Hemiplegia   . Vertigo   . Hypertension   . Restless leg syndrome   . TBI (traumatic brain injury)     WITH LEFT HEMISPHERIC BLEED  . Depression with anxiety   . High cholesterol   . Chronic pain     lower back  . Dementia   . Tremor     familiar  . Swelling of upper lip     tongue  . Weakness of both legs   . Weakness generalized 12/22/2012  . Dementia with behavioral disturbance 04/18/2013    Past Surgical History  Procedure Laterality Date  . Back surgery    . Total knee arthroplasty    . Hernia repair  1985  . Laminectomy  2006  . Colon resection  1996  . Dental surgery  3/14    all upper teeth removed    Family History  Problem Relation Age of Onset  . Tremor Maternal Grandfather     Social History:  reports that he has never smoked. He has never used smokeless tobacco. He reports that he does not drink alcohol or use illicit drugs.  Allergies:  Allergies  Allergen Reactions  . Ace Inhibitors Swelling  . Penicillins Swelling    Medications:  Prior to Admission medications   Medication Sig Start Date End Date Taking?  Authorizing Provider  ascorbic acid (VITAMIN C) 1000 MG tablet Take 1,000 mg by mouth daily.   Yes Historical Provider, MD  aspirin 325 MG tablet Take 325 mg by mouth daily.   Yes Historical Provider, MD  baclofen (LIORESAL) 10 MG tablet Take 10 mg by mouth at bedtime.   Yes Historical Provider, MD  BYSTOLIC 10 MG tablet Take 10 mg by mouth daily.  04/10/13  Yes Historical Provider, MD  Calcium Carbonate-Vitamin D (CALCIUM + D PO) Take 1 tablet by mouth daily.    Yes Historical Provider, MD  cholecalciferol (VITAMIN D) 1000 UNITS tablet Take 1,000 Units by mouth daily.     Yes Historical Provider, MD  clonazePAM (KLONOPIN) 1 MG tablet Take 0.5-1 mg by mouth at bedtime as needed for anxiety.   Yes Historical Provider, MD  cyanocobalamin 500 MCG tablet Take 500 mcg by mouth daily.   Yes Historical Provider, MD   diclofenac sodium (VOLTAREN) 1 % GEL Apply 2 g topically 4 (four) times daily as needed (Pain).   Yes Historical Provider, MD  EPINEPHrine (EPIPEN) 0.3 mg/0.3 mL DEVI Inject 0.3 mg into the muscle once. As needed for anaphylaxis (unknown cause)   Yes Historical Provider, MD  HYDROcodone-acetaminophen (NORCO/VICODIN) 5-325 MG per tablet Take 1 tablet by mouth every 6 (six) hours as needed for pain.   Yes Historical Provider, MD  Hypromellose (GENTEAL) 0.3 % SOLN Place 1 drop into both eyes 3 (three) times daily as needed (dry eyes).   Yes Historical Provider, MD  lidocaine (LIDODERM) 5 % Place 1 patch onto the skin daily as needed (Back pain). Remove & Discard patch within 12 hours or as directed by MD   Yes Historical Provider, MD  meclizine (ANTIVERT) 25 MG tablet Take 25 mg by mouth 3 (three) times daily as needed for dizziness.    Yes Historical Provider, MD  Multiple Vitamin (MULITIVITAMIN WITH MINERALS) TABS Take 1 tablet by mouth daily.     Yes Historical Provider, MD  ondansetron (ZOFRAN) 4 MG tablet Take 1 tablet (4 mg total) by mouth every 6 (six) hours. 08/03/13  Yes Blanchie Dessert, MD    Scheduled:  Continuous: . 0.9 % NaCl with KCl 20 mEq / L 100 mL/hr at 08/04/13 1016  . ciprofloxacin Stopped (08/04/13 0944)  . metronidazole 500 mg (08/04/13 1021)  . sodium chloride 1,000 mL (08/04/13 1016)   TWS:FKCLEXNT injection Anti-infectives   Start     Dose/Rate Route Frequency Ordered Stop   08/04/13 1000  ciprofloxacin (CIPRO) IVPB 400 mg     400 mg 200 mL/hr over 60 Minutes Intravenous Every 12 hours 08/04/13 0911     08/04/13 1000  metroNIDAZOLE (FLAGYL) IVPB 500 mg     500 mg 100 mL/hr over 60 Minutes Intravenous Every 8 hours 08/04/13 0911     08/04/13 0645  ciprofloxacin (CIPRO) IVPB 400 mg     400 mg 200 mL/hr over 60 Minutes Intravenous  Once 08/04/13 0644 08/04/13 0838      Results for orders placed during the hospital encounter of 08/04/13 (from the past 48 hour(s))   CBC WITH DIFFERENTIAL     Status: Abnormal   Collection Time    08/04/13  5:15 AM      Result Value Ref Range   WBC 26.7 (*) 4.0 - 10.5 K/uL   RBC 6.04 (*) 4.22 - 5.81 MIL/uL   Hemoglobin 16.3  13.0 - 17.0 g/dL   HCT 47.5  39.0 - 52.0 %  MCV 78.6  78.0 - 100.0 fL   MCH 27.0  26.0 - 34.0 pg   MCHC 34.3  30.0 - 36.0 g/dL   RDW 15.3  11.5 - 15.5 %   Platelets 283  150 - 400 K/uL   Neutrophils Relative % 86 (*) 43 - 77 %   Lymphocytes Relative 2 (*) 12 - 46 %   Monocytes Relative 12  3 - 12 %   Eosinophils Relative 0  0 - 5 %   Basophils Relative 0  0 - 1 %   Neutro Abs 23.0 (*) 1.7 - 7.7 K/uL   Lymphs Abs 0.5 (*) 0.7 - 4.0 K/uL   Monocytes Absolute 3.2 (*) 0.1 - 1.0 K/uL   Eosinophils Absolute 0.0  0.0 - 0.7 K/uL   Basophils Absolute 0.0  0.0 - 0.1 K/uL   WBC Morphology VACUOLATED NEUTROPHILS    COMPREHENSIVE METABOLIC PANEL     Status: Abnormal   Collection Time    08/04/13  5:15 AM      Result Value Ref Range   Sodium 133 (*) 137 - 147 mEq/L   Potassium 4.2  3.7 - 5.3 mEq/L   Chloride 97  96 - 112 mEq/L   CO2 20  19 - 32 mEq/L   Glucose, Bld 192 (*) 70 - 99 mg/dL   BUN 12  6 - 23 mg/dL   Creatinine, Ser 0.63  0.50 - 1.35 mg/dL   Calcium 9.3  8.4 - 10.5 mg/dL   Total Protein 6.5  6.0 - 8.3 g/dL   Albumin 3.5  3.5 - 5.2 g/dL   AST 27  0 - 37 U/L   ALT 19  0 - 53 U/L   Alkaline Phosphatase 45  39 - 117 U/L   Total Bilirubin 1.3 (*) 0.3 - 1.2 mg/dL   GFR calc non Af Amer >90  >90 mL/min   GFR calc Af Amer >90  >90 mL/min   Comment: (NOTE)     The eGFR has been calculated using the CKD EPI equation.     This calculation has not been validated in all clinical situations.     eGFR's persistently <90 mL/min signify possible Chronic Kidney     Disease.  LIPASE, BLOOD     Status: Abnormal   Collection Time    08/04/13  5:15 AM      Result Value Ref Range   Lipase 10 (*) 11 - 59 U/L  I-STAT CG4 LACTIC ACID, ED     Status: Abnormal   Collection Time    08/04/13  6:24 AM       Result Value Ref Range   Lactic Acid, Venous 2.36 (*) 0.5 - 2.2 mmol/L  URINALYSIS, ROUTINE W REFLEX MICROSCOPIC     Status: Abnormal   Collection Time    08/04/13  7:09 AM      Result Value Ref Range   Color, Urine ORANGE (*) YELLOW   Comment: BIOCHEMICALS MAY BE AFFECTED BY COLOR   APPearance CLOUDY (*) CLEAR   Specific Gravity, Urine 1.040 (*) 1.005 - 1.030   pH 5.5  5.0 - 8.0   Glucose, UA >1000 (*) NEGATIVE mg/dL   Hgb urine dipstick NEGATIVE  NEGATIVE   Bilirubin Urine Ravelo (*) NEGATIVE   Ketones, ur NEGATIVE  NEGATIVE mg/dL   Protein, ur 30 (*) NEGATIVE mg/dL   Urobilinogen, UA 1.0  0.0 - 1.0 mg/dL   Nitrite NEGATIVE  NEGATIVE   Leukocytes, UA NEGATIVE  NEGATIVE  URINE MICROSCOPIC-ADD ON     Status: Abnormal   Collection Time    08/04/13  7:09 AM      Result Value Ref Range   Squamous Epithelial / LPF RARE  RARE   WBC, UA 3-6  <3 WBC/hpf   RBC / HPF 0-2  <3 RBC/hpf   Bacteria, UA FEW (*) RARE   Urine-Other MUCOUS PRESENT      Ct Abdomen Pelvis Wo Contrast  08/03/2013   CLINICAL DATA:  General abdominal pain, nausea and vomiting. Elevated lipase.  EXAM: CT ABDOMEN AND PELVIS WITHOUT CONTRAST  TECHNIQUE: Multidetector CT imaging of the abdomen and pelvis was performed following the standard protocol without intravenous contrast.  COMPARISON:  CT of the abdomen and pelvis 05/30/2005.  FINDINGS: Lung Bases: Massive hiatal hernia with, significantly increased in size compared to the prior study. The majority of the stomach is now intrathoracic.  Abdomen/Pelvis: Colquhoun locule of gas within the nondependent portion of the gallbladder, likely gas within a non calcified gallstone. Gallbladder is moderately distended. No abnormal gallbladder wall thickening or pericholecystic fluid to suggest acute cholecystitis at this time. The unenhanced appearance of the liver, pancreas, spleen, bilateral adrenal glands and the right kidney is unremarkable. Numerous low-attenuation lesions in  the left renal pelvis are most compatible with parapelvic cysts, but are incompletely evaluated on today's non contrast CT examination. No abnormal calcifications within the collecting system of either kidney, along the course of either ureter, or within the lumen of the urinary bladder.  No significant volume of ascites. No pneumoperitoneum. No pathologic distention of Rivest bowel. No definite lymphadenopathy identified within the abdomen or pelvis on today's non contrast CT examination. Atherosclerosis throughout the abdominal and pelvic vasculature, without evidence of aneurysm. Prostate gland remains enlarged, measuring up to 4.9 x 5.7 cm on today's examination. Urinary bladder is unremarkable in appearance. Multiple surgical clips along the posterior aspect of the pelvic side wall.  Musculoskeletal: Lucent lesion in the intertrochanteric region of the right femur, similar to remote prior study, presumably benign. There are no aggressive appearing lytic or blastic lesions noted in the visualized portions of the skeleton.  IMPRESSION: 1. No acute findings in the abdomen or pelvis to account for the patient's symptoms. 2. Marked interval enlargement of a massive hiatal hernia. The stomach is essentially nearly completely intrathoracic on today's examination. 3. Foley locule of gas non dependently within the lumen of the gallbladder is presumably a Bechler amount of gas within a non calcified gallstone. No current findings to suggest acute cholecystitis at this time. 4. Atherosclerosis. 5. Additional incidental findings, as above.   Electronically Signed   By: Vinnie Langton M.D.   On: 08/03/2013 05:12   Dg Chest Port 1 View  08/04/2013   CLINICAL DATA:  Cough and congestion.  Mid to upper abdominal pain.  EXAM: PORTABLE CHEST - 1 VIEW  COMPARISON:  Chest x-ray 02/15/2010.  FINDINGS: Lung volumes are low. No consolidative airspace disease. No pleural effusions. No evidence of pulmonary edema. Large hiatal  hernia. Heart size is within normal limits. The patient is rotated to the right on today's exam, resulting in distortion of the mediastinal contours and reduced diagnostic sensitivity and specificity for mediastinal pathology.  IMPRESSION: 1. No radiographic evidence of acute cardiopulmonary disease.   Electronically Signed   By: Vinnie Langton M.D.   On: 08/04/2013 06:33   US Abdomen Limited Ruq  08/03/2013   CLINICAL DATA:  Right-sided abdominal pain  EXAM: US ABDOMEN LIMITED -  RIGHT UPPER QUADRANT  COMPARISON:  None.  FINDINGS: Gallbladder:  Well distended with multiple Taha stones and gallbladder sludge within. A negative sonographic Percell Miller sign is noted. Edematous changes in the gallbladder wall are noted. The wall is thickened a 6 mm.  Common bile duct:  Diameter: 7 mm although within normal limits for the patient's given age.  Liver:  No focal lesion identified. Within normal limits in parenchymal echogenicity.  IMPRESSION: Well distended gallbladder with evidence of gallstones and sludge within. Gallbladder wall thickening and edema is noted. These changes may still be related tracheal cholecystitis although negative sonographic Percell Miller sign is noted.   Electronically Signed   By: Inez Catalina M.D.   On: 08/03/2013 07:49    Review of Systems  Constitutional: Positive for malaise/fatigue. Negative for fever, chills and weight loss.  HENT: Negative.   Eyes: Negative.   Respiratory: Positive for cough (for the last couple days) and shortness of breath (His wife says he wakes up SOB). Negative for sputum production and wheezing.   Cardiovascular: Negative.   Gastrointestinal: Positive for heartburn, nausea, vomiting and abdominal pain (mid epigastric and later pain went to his back). Negative for diarrhea, constipation and blood in stool.  Musculoskeletal: Positive for myalgias.       He has not been able to walk straight since last summer. He gets up to chair and bedside commode, nothing  else. He has been seen for spinal stenosis, and not a good candidate for surgery according to neurology notes. He needs a knee replacement but not a candidate because of his multiple medical issues.  Skin: Negative.   Neurological: Positive for weakness.       He has vertigo and has had multiple falls. His wife reports slurred speech for 2 weeks, seen here in ER on 07/29/13 and sent home with no stroke seen on CT.  Speech is still slurred. Hemiparesis since TBI 1972 Reported restless leg  Endo/Heme/Allergies: Bruises/bleeds easily.  Psychiatric/Behavioral: Positive for depression. The patient is nervous/anxious.    Blood pressure 144/107, pulse 88, temperature 98.7 F (37.1 C), temperature source Oral, resp. rate 21, SpO2 94.00%. Physical Exam  Constitutional: No distress.  Chronically ill appearing male with prior TBI facial droop on the right.153/94  sats OK on RA-93%  HENT:  Head: Normocephalic and atraumatic.  He has a hard time sticking tongue out.  Eyes: Conjunctivae are normal. Pupils are equal, round, and reactive to light. Right eye exhibits no discharge. Left eye exhibits no discharge.  Neck: Normal range of motion. Neck supple. No JVD present. No tracheal deviation present. No thyromegaly present.  Cardiovascular: Normal rate, regular rhythm, normal heart sounds and intact distal pulses.  Exam reveals no gallop.   No murmur heard. Respiratory: Effort normal. No respiratory distress. He has no wheezes. He has no rales. He exhibits no tenderness.  Thick productive cough  GI: Soft. He exhibits no distension and no mass. There is tenderness (he is tender all over.  his wife said it was in his mid abdomen before admit). There is no rebound and no guarding.  Musculoskeletal: He exhibits no edema and no tenderness.  Lymphadenopathy:    He has no cervical adenopathy.  Neurological: He is alert.  Speech is slurred, wife said that is new over the last 2 weeks. Facial droop  And right  hemiparesis  Skin: Skin is warm and dry. No rash noted. He is not diaphoretic. No erythema.  Psychiatric: He has a normal mood and affect. His  behavior is normal.    Assessment/Plan: 1.  Cholecystitis, cholelithiasis, with some possible sepsis. 2.  Large Hiatal hernia, dysphagia with esophageal stricture history. 3.  Slurred speech for the last 2 weeks per the wife 4.  Hemiplegia/TBI/ hx of right knee and wife says he needs a left knee, but they say he's to debilitated to do it. 5.  Hx of vertigo and falls/he is confined to bed and chair now for the most. 6. Hx of dementia and behavioral changes 12/2012 7.  Chronic back pain/spinal stenosis/ 8.  Hx of colon cancer with prior resection 1996   Dr. Teena Irani - GI 9.  Hypertension 10. Dyslipidemia  Plan:  He is fairly comfortable, for now, he is on antibiotics, CT was repeated this Am after I saw him.  He is coughing but he sounds fairly clear on exam.  We are going to have medicine admit.  I think he needs to have some fluid resuscitation, and Medicine needs to tell us if it's OK to give him sedation especially with his slurred speech.  I am going to repeat his labs at noon and we will re access after he is seen by medicine. I think we would discuss percutaneous drainage with him as an option also.     Chase Knebel 08/04/2013, 9:12 AM

## 2013-08-04 NOTE — ED Notes (Signed)
Verified with Marchelle FolksAmanda from pharmacy to verify flagyl and potassium chloride compatibility. Okay to give together.

## 2013-08-04 NOTE — ED Notes (Signed)
[facility] [place] paged for dr Rhunette Croftnanavati...klj

## 2013-08-04 NOTE — ED Notes (Signed)
Per wife pt norm not oriented to time/date. Pt reports pain 7/10 to right side of abdomen.

## 2013-08-04 NOTE — ED Notes (Signed)
Pt arrives by EMS with c/o's abdominal pain.  Pt seen in ED yesterday and diagnosed with gallstones-developed severe abdominal pain at ~0400 and took hydrocodone for pain, but states still wanted transport to ED for further evaluation-pt reported vomiting.

## 2013-08-04 NOTE — ED Notes (Signed)
Pt wife reports right sided paralysis since 1972 post fall, walking difficulty since last summer, and slurred speech for 2 weeks.

## 2013-08-05 ENCOUNTER — Inpatient Hospital Stay (HOSPITAL_COMMUNITY): Payer: Medicare Other

## 2013-08-05 LAB — CBC
HEMATOCRIT: 44.9 % (ref 39.0–52.0)
Hemoglobin: 14.5 g/dL (ref 13.0–17.0)
MCH: 26.6 pg (ref 26.0–34.0)
MCHC: 32.3 g/dL (ref 30.0–36.0)
MCV: 82.4 fL (ref 78.0–100.0)
Platelets: 209 10*3/uL (ref 150–400)
RBC: 5.45 MIL/uL (ref 4.22–5.81)
RDW: 16.1 % — AB (ref 11.5–15.5)
WBC: 14.3 10*3/uL — ABNORMAL HIGH (ref 4.0–10.5)

## 2013-08-05 LAB — BASIC METABOLIC PANEL
BUN: 19 mg/dL (ref 6–23)
CO2: 19 mEq/L (ref 19–32)
Calcium: 8.6 mg/dL (ref 8.4–10.5)
Chloride: 107 mEq/L (ref 96–112)
Creatinine, Ser: 0.92 mg/dL (ref 0.50–1.35)
GFR, EST NON AFRICAN AMERICAN: 80 mL/min — AB (ref >90)
Glucose, Bld: 125 mg/dL — ABNORMAL HIGH (ref 70–99)
POTASSIUM: 4.1 meq/L (ref 3.7–5.3)
Sodium: 137 mEq/L (ref 137–147)

## 2013-08-05 LAB — URINALYSIS, ROUTINE W REFLEX MICROSCOPIC
Ketones, ur: NEGATIVE mg/dL
Nitrite: POSITIVE — AB
Protein, ur: 100 mg/dL — AB
Urobilinogen, UA: 1 mg/dL (ref 0.0–1.0)
pH: 5.5 (ref 5.0–8.0)

## 2013-08-05 LAB — URINE MICROSCOPIC-ADD ON

## 2013-08-05 LAB — PROTIME-INR
INR: 1.3 (ref 0.00–1.49)
Prothrombin Time: 15.9 seconds — ABNORMAL HIGH (ref 11.6–15.2)

## 2013-08-05 LAB — APTT: aPTT: 35 seconds (ref 24–37)

## 2013-08-05 LAB — CLOSTRIDIUM DIFFICILE BY PCR: Toxigenic C. Difficile by PCR: NEGATIVE

## 2013-08-05 MED ORDER — IOHEXOL 300 MG/ML  SOLN
20.0000 mL | Freq: Once | INTRAMUSCULAR | Status: AC | PRN
  Administered 2013-08-05: 15 mL

## 2013-08-05 MED ORDER — LIDOCAINE HCL 1 % IJ SOLN
INTRAMUSCULAR | Status: AC
Start: 2013-08-05 — End: 2013-08-06
  Filled 2013-08-05: qty 20

## 2013-08-05 MED ORDER — LOPERAMIDE HCL 2 MG PO CAPS
2.0000 mg | ORAL_CAPSULE | ORAL | Status: DC | PRN
  Administered 2013-08-05 – 2013-08-07 (×4): 2 mg via ORAL
  Filled 2013-08-05 (×4): qty 1

## 2013-08-05 MED ORDER — SODIUM CHLORIDE 0.9 % IV SOLN
INTRAVENOUS | Status: DC
  Administered 2013-08-05 – 2013-08-06 (×2): via INTRAVENOUS

## 2013-08-05 MED ORDER — FENTANYL CITRATE 0.05 MG/ML IJ SOLN
INTRAMUSCULAR | Status: AC
  Administered 2013-08-05: 50 ug
  Filled 2013-08-05: qty 2

## 2013-08-05 NOTE — Progress Notes (Signed)
Physician Daily Progress Note  Subjective: Still having abd pain  NPO Having diarrhea   Objective: Vital signs in last 24 hours: Temp:  [98.3 F (36.8 C)-98.8 F (37.1 C)] 98.4 F (36.9 C) (03/28 0533) Pulse Rate:  [80-93] 81 (03/28 0533) Resp:  [18-22] 20 (03/28 0533) BP: (109-154)/(72-98) 109/74 mmHg (03/28 0533) SpO2:  [92 %-98 %] 94 % (03/28 0533) Weight:  [84.6 kg (186 lb 8.2 oz)] 84.6 kg (186 lb 8.2 oz) (03/27 2030) Weight change:  Last BM Date: 08/04/13  CBG (last 3)  No results found for this basename: GLUCAP,  in the last 72 hours  Intake/Output from previous day:  Intake/Output Summary (Last 24 hours) at 08/05/13 0849 Last data filed at 08/05/13 0600  Gross per 24 hour  Intake   1600 ml  Output    200 ml  Net   1400 ml   03/27 0701 - 03/28 0700 In: 1600 [I.V.:1400; IV Piggyback:200] Out: 200 [Urine:200]  Physical Exam Gen: WM in NAD  HEENT:anicteric  Lungs:CTAB, no wheezes, rales  Cardio: RRR, no MRG  Abd: Soft, TTP diffusely, no masses  MSK: weakness of LE B/L  Neuro: no focal deficits  Skin:warm, dry     Lab Results:  Recent Labs  08/04/13 1215 08/05/13 0557  NA 133* 137  K 4.2 4.1  CL 103 107  CO2 19 19  GLUCOSE 123* 125*  BUN 11 19  CREATININE 0.59 0.92  CALCIUM 8.2* 8.6     Recent Labs  08/04/13 0515 08/04/13 1215  AST 27 34  ALT 19 34  ALKPHOS 45 36*  BILITOT 1.3* 0.9  PROT 6.5 5.2*  ALBUMIN 3.5 2.6*     Recent Labs  08/03/13 0125 08/04/13 0515 08/04/13 1246 08/05/13 0557  WBC 9.0 26.7* 22.1* 14.3*  NEUTROABS 6.4 23.0*  --   --   HGB 14.4 16.3 14.9 14.5  HCT 43.5 47.5 43.7 44.9  MCV 82.1 78.6 79.6 82.4  PLT 242 283 218 209    Lab Results  Component Value Date   INR 0.95 07/29/2013   INR 0.93 12/09/2012    No results found for this basename: CKTOTAL, CKMB, CKMBINDEX, TROPONINI,  in the last 72 hours  No results found for this basename: TSH, T4TOTAL, FREET3, T3FREE, THYROIDAB,  in the last 72 hours  No  results found for this basename: VITAMINB12, FOLATE, FERRITIN, TIBC, IRON, RETICCTPCT,  in the last 72 hours  Micro Results: No results found for this or any previous visit (from the past 240 hour(s)).  Studies/Results: Ct Head Wo Contrast  08/04/2013   CLINICAL DATA:  Slurred speech, dysarthria  EXAM: CT HEAD WITHOUT CONTRAST  TECHNIQUE: Contiguous axial images were obtained from the base of the skull through the vertex without intravenous contrast.  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 Chronic ischemia in the white matter left greater than right.  Ventricular enlargement secondary to atrophy. Negative for intracranial hemorrhage or mass.  No acute bony abnormality.  IMPRESSION: Atrophy and chronic ischemia.  No acute abnormality.   Electronically Signed   By: Marlan Palau M.D.   On: 08/04/2013 13:52   Ct Abdomen Pelvis W Contrast  08/04/2013   CLINICAL DATA:  Abdominal pain, epigastric and right side pain, vomiting, history right-side hemiplegia, hypertension, hiatal hernia, cholelithiasis  EXAM: CT ABDOMEN AND PELVIS WITH CONTRAST  TECHNIQUE: Multidetector CT imaging of the abdomen and pelvis was performed using the standard protocol following  bolus administration of intravenous contrast. Sagittal and coronal MPR images reconstructed from axial data set.  CONTRAST:  100mL OMNIPAQUE IOHEXOL 300 MG/ML SOLN IV. No oral contrast.  COMPARISON:  08/03/2013 abdominal CT and right upper quadrant ultrasound  FINDINGS: Baty right pleural effusion and right lower lobe atelectasis.  Large hiatal hernia.  Significantly distended gallbladder containing a dependent calcified gallstone.  Gallbladder wall thickening with moderate pericholecystic edema, progressive.  Gesner amount of perihepatic free fluid, new.  Increased edema adjacent to duodenum, pancreatic head, colon, stomach, and in porta hepatis.  Findings are most consistent with acute  cholecystitis progressive since previous study.  Liver, spleen, pancreas, and adrenal glands otherwise unremarkable.  Symmetric nephrograms with bilateral peripelvic renal cysts.  Stomach and bowel loops otherwise unremarkable.  Dilated inguinal canals containing fat question hernia.  Minimal prostatic enlargement.  Unremarkable appendix, bladder, and ureters.  Minimal pelvic free fluid.  No mass, adenopathy, or free air.  Tiny focus of gas seen within gallbladder on previous exam no longer identified.  Scattered degenerative disc disease changes lumbar spine.  IMPRESSION: Distended gallbladder with cholelithiasis, gallbladder wall thickening and increased  pericholecystic/right upper quadrant edema since previous study consistent with acute cholecystitis.  Darnold amount of free intraperitoneal fluid now identified.  Large hiatal hernia.  Bilateral peripelvic renal cysts.  Carmical right pleural effusion and increased right lower lobe atelectasis.   Electronically Signed   By: Ulyses SouthwardMark  Boles M.D.   On: 08/04/2013 09:40   Dg Chest Port 1 View  08/04/2013   CLINICAL DATA:  Cough and congestion.  Mid to upper abdominal pain.  EXAM: PORTABLE CHEST - 1 VIEW  COMPARISON:  Chest x-ray 02/15/2010.  FINDINGS: Lung volumes are low. No consolidative airspace disease. No pleural effusions. No evidence of pulmonary edema. Large hiatal hernia. Heart size is within normal limits. The patient is rotated to the right on today's exam, resulting in distortion of the mediastinal contours and reduced diagnostic sensitivity and specificity for mediastinal pathology.  IMPRESSION: 1. No radiographic evidence of acute cardiopulmonary disease.   Electronically Signed   By: Trudie Reedaniel  Entrikin M.D.   On: 08/04/2013 06:33     Medications: Scheduled: . aspirin  325 mg Oral Daily  . baclofen  20 mg Oral Daily  . ciprofloxacin  400 mg Intravenous Q12H  . cyanocobalamin  500 mcg Oral Daily  . Linaclotide  145 mcg Oral Daily  . metronidazole   500 mg Intravenous Q8H  . nebivolol  10 mg Oral Daily  . niacin  500 mg Oral QHS  . simvastatin  20 mg Oral q1800   Continuous: . 0.9 % NaCl with KCl 20 mEq / L 100 mL/hr at 08/05/13 0535    Assessment/Plan:  Sepsis - resolved. WBC improving. VSS. Lactate normalized   Cholecystitis - cipro/flagyl/IVF. NPO. awaiting surgery recs this AM.   Diarrhea - c diff pending. On flagyl. CT w/o complications   DVT Prophylaxis - SCDs    LOS: 1 day   Shawnika Pepin 08/05/2013, 8:49 AM

## 2013-08-05 NOTE — Progress Notes (Signed)
Paged Dr. Link SnufferHolwerda with Pt's UA results, also got orders to change and increase IVFs and give imodium with every loose BM.

## 2013-08-05 NOTE — Progress Notes (Signed)
Subjective: Still with RUQ abdominal pain  Objective: Vital signs in last 24 hours: Temp:  [98.3 F (36.8 C)-98.8 F (37.1 C)] 98.4 F (36.9 C) (03/28 0533) Pulse Rate:  [80-90] 81 (03/28 0533) Resp:  [18-22] 20 (03/28 0533) BP: (109-154)/(72-88) 109/74 mmHg (03/28 0533) SpO2:  [92 %-98 %] 94 % (03/28 0533) Weight:  [186 lb 8.2 oz (84.6 kg)] 186 lb 8.2 oz (84.6 kg) (03/27 2030) Last BM Date: 08/04/13  Intake/Output from previous day: 03/27 0701 - 03/28 0700 In: 1600 [I.V.:1400; IV Piggyback:200] Out: 200 [Urine:200] Intake/Output this shift:   Abdomen with moderate to severe RUQ tenderness and guarding  Lab Results:   Recent Labs  08/04/13 1246 08/05/13 0557  WBC 22.1* 14.3*  HGB 14.9 14.5  HCT 43.7 44.9  PLT 218 209   BMET  Recent Labs  08/04/13 1215 08/05/13 0557  NA 133* 137  K 4.2 4.1  CL 103 107  CO2 19 19  GLUCOSE 123* 125*  BUN 11 19  CREATININE 0.59 0.92  CALCIUM 8.2* 8.6   PT/INR No results found for this basename: LABPROT, INR,  in the last 72 hours ABG No results found for this basename: PHART, PCO2, PO2, HCO3,  in the last 72 hours  Studies/Results: Ct Head Wo Contrast  08/04/2013   CLINICAL DATA:  Slurred speech, dysarthria  EXAM: CT HEAD WITHOUT CONTRAST  TECHNIQUE: Contiguous axial images were obtained from the base of the skull through the vertex without intravenous contrast.  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 Chronic ischemia in the white matter left greater than right.  Ventricular enlargement secondary to atrophy. Negative for intracranial hemorrhage or mass.  No acute bony abnormality.  IMPRESSION: Atrophy and chronic ischemia.  No acute abnormality.   Electronically Signed   By: Marlan Palau M.D.   On: 08/04/2013 13:52   Ct Abdomen Pelvis W Contrast  08/04/2013   CLINICAL DATA:  Abdominal pain, epigastric and right side pain, vomiting, history right-side  hemiplegia, hypertension, hiatal hernia, cholelithiasis  EXAM: CT ABDOMEN AND PELVIS WITH CONTRAST  TECHNIQUE: Multidetector CT imaging of the abdomen and pelvis was performed using the standard protocol following bolus administration of intravenous contrast. Sagittal and coronal MPR images reconstructed from axial data set.  CONTRAST:  OMNIPAQUE IOHEXOL 300 MG/ML SOLN IV. No oral contrast.  COMPARISON:  08/03/2013 abdominal CT and right upper quadrant ultrasound  FINDINGS: Eskew right pleural effusion and right lower lobe atelectasis.  Large hiatal hernia.  Significantly distended gallbladder containing [name] dependent calcified gallstone.  Gallbladder wall thickening with moderate pericholecystic edema, progressive.  Fellers amount of perihepatic free fluid, new.  Increased edema adjacent to duodenum, pancreatic head, colon, stomach, and in porta hepatis.  Findings are most consistent with acute cholecystitis progressive since previous study.  Liver, spleen, pancreas, and adrenal glands otherwise unremarkable.  Symmetric nephrograms with bilateral peripelvic renal cysts.  Stomach and bowel loops otherwise unremarkable.  Dilated inguinal canals containing fat question hernia.  Minimal prostatic enlargement.  Unremarkable appendix, bladder, and ureters.  Minimal pelvic free fluid.  No mass, adenopathy, or free air.  Tiny focus of gas seen within gallbladder on previous exam no longer identified.  Scattered degenerative disc disease changes lumbar spine.  IMPRESSION: Distended gallbladder with cholelithiasis, gallbladder wall thickening and increased  pericholecystic/right upper quadrant edema since previous study consistent with acute cholecystitis.  Tarte amount of free intraperitoneal fluid now identified.  Large hiatal hernia.  Bilateral  peripelvic renal cysts.  Bellucci right pleural effusion and increased right lower lobe atelectasis.   Electronically Signed   By: Ulyses SouthwardMark  Boles M.D.   On: 08/04/2013 09:40   Dg  Chest Port 1 View  08/04/2013   CLINICAL DATA:  Cough and congestion.  Mid to upper abdominal pain.  EXAM: PORTABLE CHEST - 1 VIEW  COMPARISON:  Chest x-ray 02/15/2010.  FINDINGS: Lung volumes are low. No consolidative airspace disease. No pleural effusions. No evidence of pulmonary edema. Large hiatal hernia. Heart size is within normal limits. The patient is rotated to the right on today's exam, resulting in distortion of the mediastinal contours and reduced diagnostic sensitivity and specificity for mediastinal pathology.  IMPRESSION: 1. No radiographic evidence of acute cardiopulmonary disease.   Electronically Signed   By: Trudie Reedaniel  Entrikin M.D.   On: 08/04/2013 06:33    Anti-infectives: Anti-infectives   Start     Dose/Rate Route Frequency Ordered Stop   08/04/13 1000  ciprofloxacin (CIPRO) IVPB 400 mg     400 mg 200 mL/hr over 60 Minutes Intravenous Every 12 hours 08/04/13 0911     08/04/13 1000  metroNIDAZOLE (FLAGYL) IVPB 500 mg     500 mg 100 mL/hr over 60 Minutes Intravenous Every 8 hours 08/04/13 0911     08/04/13 0645  ciprofloxacin (CIPRO) IVPB 400 mg     400 mg 200 mL/hr over 60 Minutes Intravenous  Once 08/04/13 [id number] 08/04/13 [id number]      Assessment/Plan:  Cholecystitis  Patient is [name] poor operative candidate.  WBC improved, but clinically not improved with tenderness.  He needs [name] percutaneous cholecystostomy tube.  Will order through IR  LOS: 1 day    [name] 08/05/2013

## 2013-08-05 NOTE — Progress Notes (Signed)
Physical Therapy Treatment Patient Details Name: VERA WISHART MRN: [id number] DOB: 1936-04-01 Today's Date: 08/05/2013    History of Present Illness Pt admitted with abd pain and found to have gallstones.  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    PT Comments    Pt admitted with abd pain and gallstones. Pt currently with functional limitations due to the deficits listed below (see PT Problem List). Pt will benefit from skilled PT to increase their independence and safety with mobility to allow discharge to the venue listed below. Wife is open to SNF rehab to work towards returning to [facility].   Follow Up Recommendations  SNF     Equipment Recommendations  None recommended by PT    Recommendations for Other Services Speech consult     Precautions / Restrictions Precautions Precautions: Fall    Mobility  Bed Mobility Overal bed mobility: Needs Assistance;+2 for physical assistance Bed Mobility: Supine to Sit;Sit to Supine     Supine to sit: +2 for physical assistance;Max assist Sit to supine: +2 for physical assistance;Max assist   General bed mobility comments: Pt attempting to help with supine to sit with use of bed rail.  Transfers                 General transfer comment: Deferred transfer due to fatigue and abd pain.  Ambulation/Gait                 Stairs            Wheelchair Mobility    Modified Rankin (Stroke Patients Only)       Balance Overall balance assessment: Needs assistance   Sitting balance-Leahy Scale: Poor Sitting balance - Comments: Pt's balance fluctuating between poor to fair(-).  Balance improved as pt sat EOB and used L bed rail to A.  Sat up for ~10 minutes before needing to lie back down.                            Cognition Arousal/Alertness: Awake/alert Behavior During Therapy: WFL for tasks assessed/performed Overall Cognitive Status: History of cognitive  impairments - at baseline                      Exercises      General Comments General comments (skin integrity, edema, etc.): Discussed with wife at length pt's medical status and goals.      Pertinent Vitals/Pain FACES 4/10    Home Living Family/patient expects to be discharged to:: Skilled nursing facility Living Arrangements: Spouse/significant other           Home Equipment: Transport chair;Other (comment);Bedside commode;Walker - 2 wheels (regular bed with rails)      Prior Function Level of Independence: Needs assistance  Gait / Transfers Assistance Needed: Pt performs SPT with MOD A of wife.  I with bed mobility until 1 week ago.       PT Goals (current goals can now be found in the care plan section) Acute Rehab PT Goals Patient Stated Goal: Wife is open to rehab prior to going home. PT Goal Formulation: With patient/family Time For Goal Achievement: 08/19/13 Potential to Achieve Goals: Good    Frequency  Min 3X/week    PT Plan      End of Session   Activity Tolerance: Patient limited by fatigue Patient left: in bed;with family/visitor present  Time: [id number] PT Time Calculation (min): 32 min  Charges:  $Therapeutic Activity: 23-37 mins                    G Codes:      Jillayne Witte LUBECK 08/05/2013, 1:30 PM

## 2013-08-05 NOTE — H&P (Signed)
[name] is an 78 y.o. male.   Chief Complaint: Abdominal pain. HPI: Consulted by Dr. Blackman for percutaneous cholecystostomy secondary to cholecystitis and sepsis.  Patient is not a candidate currently for cholecystectomy.  Imaging shows evidence of inflammed and distended gallbladder with calculi.  Past Medical History  Diagnosis Date  . Hemiplegia   . Vertigo   . Hypertension   . Restless leg syndrome   . TBI (traumatic brain injury)     WITH LEFT HEMISPHERIC BLEED  . Depression with anxiety   . High cholesterol   . Chronic pain     lower back  . Dementia   . Tremor     familiar  . Swelling of upper lip     tongue  . Weakness of both legs   . Weakness generalized 12/22/2012  . Dementia with behavioral disturbance 04/18/2013    Past Surgical History  Procedure Laterality Date  . Back surgery    . Total knee arthroplasty    . Hernia repair  1985  . Laminectomy  2006  . Colon resection  1996  . Dental surgery  3/14    all upper teeth removed    Family History  Problem Relation Age of Onset  . Tremor Maternal Grandfather    Social History:  reports that he has never smoked. He has never used smokeless tobacco. He reports that he does not drink alcohol or use illicit drugs.  Allergies:  Allergies  Allergen Reactions  . Ace Inhibitors Swelling  . Penicillins Swelling    Medications Prior to Admission  Medication Sig Dispense Refill  . ascorbic acid (VITAMIN C) 1000 MG tablet Take 1,000 mg by mouth daily.      . aspirin 325 MG tablet Take 325 mg by mouth daily.      . baclofen (LIORESAL) 10 MG tablet Take 10 mg by mouth at bedtime.      . BYSTOLIC 10 MG tablet Take 10 mg by mouth daily.       . Calcium Carbonate-Vitamin D (CALCIUM + D PO) Take 1 tablet by mouth daily.       . cholecalciferol (VITAMIN D) 1000 UNITS tablet Take 1,000 Units by mouth daily.        . clonazePAM (KLONOPIN) 1 MG tablet Take 0.5-1 mg by mouth at bedtime as needed for anxiety.      .  cyanocobalamin 500 MCG tablet Take 500 mcg by mouth daily.      . diclofenac sodium (VOLTAREN) 1 % GEL Apply 2 g topically 4 (four) times daily as needed (Pain).      . EPINEPHrine (EPIPEN) 0.3 mg/0.3 mL DEVI Inject 0.3 mg into the muscle once. As needed for anaphylaxis (unknown cause)      . HYDROcodone-acetaminophen (NORCO/VICODIN) 5-325 MG per tablet Take 1 tablet by mouth every 6 (six) hours as needed for pain.      . Hypromellose (GENTEAL) 0.3 % SOLN Place 1 drop into both eyes 3 (three) times daily as needed (dry eyes).      . lidocaine (LIDODERM) 5 % Place 1 patch onto the skin daily as needed (Back pain). Remove & Discard patch within 12 hours or as directed by MD      . meclizine (ANTIVERT) 25 MG tablet Take 25 mg by mouth 3 (three) times daily as needed for dizziness.       . Multiple Vitamin (MULITIVITAMIN WITH MINERALS) TABS Take 1 tablet by mouth daily.        .   ondansetron (ZOFRAN) 4 MG tablet Take 1 tablet (4 mg total) by mouth every 6 (six) hours.  12 tablet  0    Results for orders placed during the hospital encounter of 08/04/13 (from the past 48 hour(s))  CBC WITH DIFFERENTIAL     Status: Abnormal   Collection Time    08/04/13  5:15 AM      Result Value Ref Range   WBC 26.7 (*) 4.0 - 10.5 K/uL   RBC 6.04 (*) 4.22 - 5.81 MIL/uL   Hemoglobin 16.3  13.0 - 17.0 g/dL   HCT 47.5  39.0 - 52.0 %   MCV 78.6  78.0 - 100.0 fL   MCH 27.0  26.0 - 34.0 pg   MCHC 34.3  30.0 - 36.0 g/dL   RDW 15.3  11.5 - 15.5 %   Platelets 283  150 - 400 K/uL   Neutrophils Relative % 86 (*) 43 - 77 %   Lymphocytes Relative 2 (*) 12 - 46 %   Monocytes Relative 12  3 - 12 %   Eosinophils Relative 0  0 - 5 %   Basophils Relative 0  0 - 1 %   Neutro Abs 23.0 (*) 1.7 - 7.7 K/uL   Lymphs Abs 0.5 (*) 0.7 - 4.0 K/uL   Monocytes Absolute 3.2 (*) 0.1 - 1.0 K/uL   Eosinophils Absolute 0.0  0.0 - 0.7 K/uL   Basophils Absolute 0.0  0.0 - 0.1 K/uL   WBC Morphology VACUOLATED NEUTROPHILS    COMPREHENSIVE  METABOLIC PANEL     Status: Abnormal   Collection Time    08/04/13  5:15 AM      Result Value Ref Range   Sodium 133 (*) 137 - 147 mEq/L   Potassium 4.2  3.7 - 5.3 mEq/L   Chloride 97  96 - 112 mEq/L   CO2 20  19 - 32 mEq/L   Glucose, Bld 192 (*) 70 - 99 mg/dL   BUN 12  6 - 23 mg/dL   Creatinine, Ser 0.63  0.50 - 1.35 mg/dL   Calcium 9.3  8.4 - 10.5 mg/dL   Total Protein 6.5  6.0 - 8.3 g/dL   Albumin 3.5  3.5 - 5.2 g/dL   AST 27  0 - 37 U/L   ALT 19  0 - 53 U/L   Alkaline Phosphatase 45  39 - 117 U/L   Total Bilirubin 1.3 (*) 0.3 - 1.2 mg/dL   GFR calc non Af Amer >90  >90 mL/min   GFR calc Af Amer >90  >90 mL/min   Comment: (NOTE)     The eGFR has been calculated using the CKD EPI equation.     This calculation has not been validated in all clinical situations.     eGFR's persistently <90 mL/min signify possible Chronic Kidney     Disease.  LIPASE, BLOOD     Status: Abnormal   Collection Time    08/04/13  5:15 AM      Result Value Ref Range   Lipase 10 (*) 11 - 59 U/L  I-STAT CG4 LACTIC ACID, ED     Status: Abnormal   Collection Time    08/04/13  6:24 AM      Result Value Ref Range   Lactic Acid, Venous 2.36 (*) 0.5 - 2.2 mmol/L  URINALYSIS, ROUTINE W REFLEX MICROSCOPIC     Status: Abnormal   Collection Time    08/04/13  7:09 AM  Result Value Ref Range   Color, Urine ORANGE (*) YELLOW   Comment: BIOCHEMICALS MAY BE AFFECTED BY COLOR   APPearance CLOUDY (*) CLEAR   Specific Gravity, Urine 1.040 (*) 1.005 - 1.030   pH 5.5  5.0 - 8.0   Glucose, UA >1000 (*) NEGATIVE mg/dL   Hgb urine dipstick NEGATIVE  NEGATIVE   Bilirubin Urine Muldoon (*) NEGATIVE   Ketones, ur NEGATIVE  NEGATIVE mg/dL   Protein, ur 30 (*) NEGATIVE mg/dL   Urobilinogen, UA 1.0  0.0 - 1.0 mg/dL   Nitrite NEGATIVE  NEGATIVE   Leukocytes, UA NEGATIVE  NEGATIVE  URINE MICROSCOPIC-ADD ON     Status: Abnormal   Collection Time    08/04/13  7:09 AM      Result Value Ref Range   Squamous Epithelial /  LPF RARE  RARE   WBC, UA 3-6  <3 WBC/hpf   RBC / HPF 0-2  <3 RBC/hpf   Bacteria, UA FEW (*) RARE   Urine-Other MUCOUS PRESENT    LACTIC ACID, PLASMA     Status: None   Collection Time    08/04/13 12:15 PM      Result Value Ref Range   Lactic Acid, Venous 1.6  0.5 - 2.2 mmol/L  COMPREHENSIVE METABOLIC PANEL     Status: Abnormal   Collection Time    08/04/13 12:15 PM      Result Value Ref Range   Sodium 133 (*) 137 - 147 mEq/L   Potassium 4.2  3.7 - 5.3 mEq/L   Chloride 103  96 - 112 mEq/L   CO2 19  19 - 32 mEq/L   Glucose, Bld 123 (*) 70 - 99 mg/dL   BUN 11  6 - 23 mg/dL   Creatinine, Ser 0.59  0.50 - 1.35 mg/dL   Calcium 8.2 (*) 8.4 - 10.5 mg/dL   Total Protein 5.2 (*) 6.0 - 8.3 g/dL   Albumin 2.6 (*) 3.5 - 5.2 g/dL   AST 34  0 - 37 U/L   ALT 34  0 - 53 U/L   Alkaline Phosphatase 36 (*) 39 - 117 U/L   Total Bilirubin 0.9  0.3 - 1.2 mg/dL   GFR calc non Af Amer >90  >90 mL/min   GFR calc Af Amer >90  >90 mL/min   Comment: (NOTE)     The eGFR has been calculated using the CKD EPI equation.     This calculation has not been validated in all clinical situations.     eGFR's persistently <90 mL/min signify possible Chronic Kidney     Disease.  LIPASE, BLOOD     Status: Abnormal   Collection Time    08/04/13 12:15 PM      Result Value Ref Range   Lipase 8 (*) 11 - 59 U/L  CBC     Status: Abnormal   Collection Time    08/04/13 12:46 PM      Result Value Ref Range   WBC 22.1 (*) 4.0 - 10.5 K/uL   RBC 5.49  4.22 - 5.81 MIL/uL   Hemoglobin 14.9  13.0 - 17.0 g/dL   HCT 43.7  39.0 - 52.0 %   MCV 79.6  78.0 - 100.0 fL   MCH 27.1  26.0 - 34.0 pg   MCHC 34.1  30.0 - 36.0 g/dL   RDW 15.6 (*) 11.5 - 15.5 %   Platelets 218  150 - 400 K/uL  CLOSTRIDIUM DIFFICILE BY PCR  Status: None   Collection Time    08/05/13  5:15 AM      Result Value Ref Range   C difficile by pcr NEGATIVE  NEGATIVE   Comment: Performed at [facility]  CBC     Status: Abnormal   Collection Time     08/05/13  5:57 AM      Result Value Ref Range   WBC 14.3 (*) 4.0 - 10.5 K/uL   RBC 5.45  4.22 - 5.81 MIL/uL   Hemoglobin 14.5  13.0 - 17.0 g/dL   HCT 44.9  39.0 - 52.0 %   MCV 82.4  78.0 - 100.0 fL   MCH 26.6  26.0 - 34.0 pg   MCHC 32.3  30.0 - 36.0 g/dL   RDW 16.1 (*) 11.5 - 15.5 %   Platelets 209  150 - 400 K/uL  BASIC METABOLIC PANEL     Status: Abnormal   Collection Time    08/05/13  5:57 AM      Result Value Ref Range   Sodium 137  137 - 147 mEq/L   Potassium 4.1  3.7 - 5.3 mEq/L   Chloride 107  96 - 112 mEq/L   CO2 19  19 - 32 mEq/L   Glucose, Bld 125 (*) 70 - 99 mg/dL   BUN 19  6 - 23 mg/dL   Creatinine, Ser 0.92  0.50 - 1.35 mg/dL   Comment: DELTA CHECK NOTED   Calcium 8.6  8.4 - 10.5 mg/dL   GFR calc non Af Amer 80 (*) >90 mL/min   GFR calc Af Amer >90  >90 mL/min   Comment: (NOTE)     The eGFR has been calculated using the CKD EPI equation.     This calculation has not been validated in all clinical situations.     eGFR's persistently <90 mL/min signify possible Chronic Kidney     Disease.  PROTIME-INR     Status: Abnormal   Collection Time    08/05/13 10:18 AM      Result Value Ref Range   Prothrombin Time 15.9 (*) 11.6 - 15.2 seconds   INR 1.30  0.00 - 1.49  APTT     Status: None   Collection Time    08/05/13 10:18 AM      Result Value Ref Range   aPTT 35  24 - 37 seconds   Ct Head Wo Contrast  08/04/2013   CLINICAL DATA:  Slurred speech, dysarthria  EXAM: CT HEAD WITHOUT CONTRAST  TECHNIQUE: Contiguous axial images were obtained from the base of the skull through the vertex without intravenous contrast.  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 Chronic ischemia in the white matter left greater than right.  Ventricular enlargement secondary to atrophy. Negative for intracranial hemorrhage or mass.  No acute bony abnormality.  IMPRESSION: Atrophy and chronic ischemia.  No acute abnormality.    Electronically Signed   By: Charles  Clark M.D.   On: 08/04/2013 13:52   Ct Abdomen Pelvis W Contrast  08/04/2013   CLINICAL DATA:  Abdominal pain, epigastric and right side pain, vomiting, history right-side hemiplegia, hypertension, hiatal hernia, cholelithiasis  EXAM: CT ABDOMEN AND PELVIS WITH CONTRAST  TECHNIQUE: Multidetector CT imaging of the abdomen and pelvis was performed using the standard protocol following bolus administration of intravenous contrast. Sagittal and coronal MPR images reconstructed from axial data set.  CONTRAST:  100mL OMNIPAQUE IOHEXOL 300 MG/ML SOLN   IV. No oral contrast.  COMPARISON:  08/03/2013 abdominal CT and right upper quadrant ultrasound  FINDINGS: Mealy right pleural effusion and right lower lobe atelectasis.  Large hiatal hernia.  Significantly distended gallbladder containing a dependent calcified gallstone.  Gallbladder wall thickening with moderate pericholecystic edema, progressive.  Abdelaziz amount of perihepatic free fluid, new.  Increased edema adjacent to duodenum, pancreatic head, colon, stomach, and in porta hepatis.  Findings are most consistent with acute cholecystitis progressive since previous study.  Liver, spleen, pancreas, and adrenal glands otherwise unremarkable.  Symmetric nephrograms with bilateral peripelvic renal cysts.  Stomach and bowel loops otherwise unremarkable.  Dilated inguinal canals containing fat question hernia.  Minimal prostatic enlargement.  Unremarkable appendix, bladder, and ureters.  Minimal pelvic free fluid.  No mass, adenopathy, or free air.  Tiny focus of gas seen within gallbladder on previous exam no longer identified.  Scattered degenerative disc disease changes lumbar spine.  IMPRESSION: Distended gallbladder with cholelithiasis, gallbladder wall thickening and increased  pericholecystic/right upper quadrant edema since previous study consistent with acute cholecystitis.  Follette amount of free intraperitoneal fluid now  identified.  Large hiatal hernia.  Bilateral peripelvic renal cysts.  Newson right pleural effusion and increased right lower lobe atelectasis.   Electronically Signed   By: Lavonia Dana M.D.   On: 08/04/2013 09:40   Dg Chest Port 1 View  08/04/2013   CLINICAL DATA:  Cough and congestion.  Mid to upper abdominal pain.  EXAM: PORTABLE CHEST - 1 VIEW  COMPARISON:  Chest x-ray 02/15/2010.  FINDINGS: Lung volumes are low. No consolidative airspace disease. No pleural effusions. No evidence of pulmonary edema. Large hiatal hernia. Heart size is within normal limits. The patient is rotated to the right on today's exam, resulting in distortion of the mediastinal contours and reduced diagnostic sensitivity and specificity for mediastinal pathology.  IMPRESSION: 1. No radiographic evidence of acute cardiopulmonary disease.   Electronically Signed   By: Vinnie Langton M.D.   On: 08/04/2013 06:33    ROS  Blood pressure 122/73, pulse 78, temperature 98.7 F (37.1 C), temperature source Oral, resp. rate 19, height 6' 2" (1.88 m), weight 186 lb 8.2 oz (84.6 kg), SpO2 94.00%. Physical Exam  Cardiovascular: Normal rate, regular rhythm and normal heart sounds.   Respiratory: Breath sounds normal.  GI: Soft.     Assessment/Plan Percutaneous cholecystostomy later today.  Consent obtained from patient's wife and all questions answered.  Yahel Fuston T 08/05/2013, 1:49 PM

## 2013-08-05 NOTE — Procedures (Signed)
Procedure:  Percutaneous cholecystostomy Findings:  Bile in GB dark, black in color.  Contrast injection shows GB distention and numerous calculi. 10 Fr tube placed into GB and attached to gravity bag.  Bile sample sent for culture.

## 2013-08-06 ENCOUNTER — Inpatient Hospital Stay (HOSPITAL_COMMUNITY): Payer: Medicare Other

## 2013-08-06 LAB — BASIC METABOLIC PANEL
BUN: 26 mg/dL — ABNORMAL HIGH (ref 6–23)
CO2: 21 mEq/L (ref 19–32)
Calcium: 8.3 mg/dL — ABNORMAL LOW (ref 8.4–10.5)
Chloride: 112 mEq/L (ref 96–112)
Creatinine, Ser: 0.91 mg/dL (ref 0.50–1.35)
GFR, EST NON AFRICAN AMERICAN: 80 mL/min — AB (ref >90)
Glucose, Bld: 94 mg/dL (ref 70–99)
POTASSIUM: 3.5 meq/L — AB (ref 3.7–5.3)
SODIUM: 140 meq/L (ref 137–147)

## 2013-08-06 LAB — HEPATIC FUNCTION PANEL
ALT: 23 U/L (ref 0–53)
AST: 21 U/L (ref 0–37)
Albumin: 2.1 g/dL — ABNORMAL LOW (ref 3.5–5.2)
Alkaline Phosphatase: 35 U/L — ABNORMAL LOW (ref 39–117)
BILIRUBIN TOTAL: 0.5 mg/dL (ref 0.3–1.2)
Total Protein: 4.9 g/dL — ABNORMAL LOW (ref 6.0–8.3)

## 2013-08-06 LAB — CBC
HCT: 41.5 % (ref 39.0–52.0)
HEMOGLOBIN: 13.8 g/dL (ref 13.0–17.0)
MCH: 27.1 pg (ref 26.0–34.0)
MCHC: 33.3 g/dL (ref 30.0–36.0)
MCV: 81.5 fL (ref 78.0–100.0)
Platelets: 224 10*3/uL (ref 150–400)
RBC: 5.09 MIL/uL (ref 4.22–5.81)
RDW: 16.2 % — ABNORMAL HIGH (ref 11.5–15.5)
WBC: 11.2 10*3/uL — AB (ref 4.0–10.5)

## 2013-08-06 LAB — HEMOGLOBIN A1C
Hgb A1c MFr Bld: 5.5 % (ref <5.7)
Mean Plasma Glucose: 111 mg/dL (ref <117)

## 2013-08-06 LAB — OCCULT BLOOD X 1 CARD TO LAB, STOOL: Fecal Occult Bld: NEGATIVE

## 2013-08-06 LAB — LIPASE, BLOOD: LIPASE: 8 U/L — AB (ref 11–59)

## 2013-08-06 LAB — CK: CK TOTAL: 54 U/L (ref 7–232)

## 2013-08-06 MED ORDER — POLYETHYLENE GLYCOL 3350 17 G PO PACK
17.0000 g | PACK | Freq: Two times a day (BID) | ORAL | Status: DC
  Filled 2013-08-06 (×2): qty 1

## 2013-08-06 MED ORDER — ALUM & MAG HYDROXIDE-SIMETH 200-200-20 MG/5ML PO SUSP: 30.0000 mL | Freq: Four times a day (QID) | ORAL | Status: DC | PRN

## 2013-08-06 MED ORDER — PHENOL 1.4 % MT LIQD: 2.0000 | OROMUCOSAL | Status: DC | PRN

## 2013-08-06 MED ORDER — IPRATROPIUM-ALBUTEROL 0.5-2.5 (3) MG/3ML IN SOLN: 3.0000 mL | RESPIRATORY_TRACT | Status: DC

## 2013-08-06 MED ORDER — MENTHOL 3 MG MT LOZG
1.0000 | LOZENGE | OROMUCOSAL | Status: DC | PRN
  Administered 2013-08-06: 3 mg via ORAL
  Filled 2013-08-06: qty 9

## 2013-08-06 MED ORDER — MORPHINE SULFATE 2 MG/ML IJ SOLN: 2.0000 mg | INTRAMUSCULAR | Status: DC | PRN

## 2013-08-06 MED ORDER — LACTATED RINGERS IV BOLUS (SEPSIS): 1000.0000 mL | Freq: Three times a day (TID) | INTRAVENOUS | Status: DC | PRN

## 2013-08-06 MED ORDER — MAGNESIUM HYDROXIDE 400 MG/5ML PO SUSP: 30.0000 mL | Freq: Two times a day (BID) | ORAL | Status: DC | PRN

## 2013-08-06 MED ORDER — BISACODYL 10 MG RE SUPP: 10.0000 mg | Freq: Two times a day (BID) | RECTAL | Status: DC | PRN

## 2013-08-06 MED ORDER — SACCHAROMYCES BOULARDII 250 MG PO CAPS
250.0000 mg | ORAL_CAPSULE | Freq: Two times a day (BID) | ORAL | Status: DC
  Administered 2013-08-06 – 2013-08-09 (×6): 250 mg via ORAL
  Filled 2013-08-06 (×8): qty 1

## 2013-08-06 MED ORDER — LIP MEDEX EX OINT
1.0000 "application " | TOPICAL_OINTMENT | Freq: Two times a day (BID) | CUTANEOUS | Status: DC
  Administered 2013-08-06 – 2013-08-09 (×7): 1 via TOPICAL
  Filled 2013-08-06: qty 7

## 2013-08-06 MED ORDER — POTASSIUM CHLORIDE CRYS ER 20 MEQ PO TBCR
40.0000 meq | EXTENDED_RELEASE_TABLET | Freq: Two times a day (BID) | ORAL | Status: AC
  Administered 2013-08-06: 40 meq via ORAL
  Filled 2013-08-06 (×2): qty 2

## 2013-08-06 MED ORDER — MAGIC MOUTHWASH
15.0000 mL | Freq: Four times a day (QID) | ORAL | Status: DC | PRN
  Filled 2013-08-06: qty 15

## 2013-08-06 MED ORDER — ACETAMINOPHEN 650 MG RE SUPP: 650.0000 mg | Freq: Four times a day (QID) | RECTAL | Status: DC | PRN

## 2013-08-06 MED ORDER — DIPHENHYDRAMINE HCL 50 MG/ML IJ SOLN: 12.5000 mg | Freq: Four times a day (QID) | INTRAMUSCULAR | Status: DC | PRN

## 2013-08-06 MED ORDER — ACETAMINOPHEN 325 MG PO TABS: 325.0000 mg | ORAL_TABLET | Freq: Four times a day (QID) | ORAL | Status: DC | PRN

## 2013-08-06 MED ORDER — IPRATROPIUM-ALBUTEROL 0.5-2.5 (3) MG/3ML IN SOLN
3.0000 mL | RESPIRATORY_TRACT | Status: DC | PRN
  Administered 2013-08-06: 3 mL via RESPIRATORY_TRACT
  Filled 2013-08-06: qty 3

## 2013-08-06 NOTE — Progress Notes (Signed)
Physician Daily Progress Note  Subjective: Still having abd pain , but improving NPO Having diarrhea  Still having dark urine   Objective: Vital signs in last 24 hours: Temp:  [97.9 F (36.6 C)-98.7 F (37.1 C)] 97.9 F (36.6 C) (03/29 0603) Pulse Rate:  [71-82] 71 (03/29 0603) Resp:  [18-23] 20 (03/29 0603) BP: (104-132)/(62-77) 117/75 mmHg (03/29 0603) SpO2:  [91 %-96 %] 94 % (03/29 0603) Weight change:  Last BM Date: 08/04/13  CBG (last 3)  No results found for this basename: GLUCAP,  in the last 72 hours  Intake/Output from previous day:  Intake/Output Summary (Last 24 hours) at 08/06/13 0947 Last data filed at 08/06/13 0600  Gross per 24 hour  Intake 2185.42 ml  Output    200 ml  Net 1985.42 ml   03/28 0701 - 03/29 0700 In: 2185.4 [I.V.:1685.4; IV Piggyback:500] Out: 200 [Urine:100; Drains:100]  Physical Exam Gen: WM in NAD  HEENT:anicteric  Lungs:CTAB, no wheezes, rales  Cardio: RRR, no MRG  Abd: Soft, TTP diffusely, no masses  MSK: weakness of LE B/L  Neuro: no focal deficits  Skin:warm, dry     Lab Results:  Recent Labs  08/05/13 0557 08/06/13 0502  NA 137 140  K 4.1 3.5*  CL 107 112  CO2 19 21  GLUCOSE 125* 94  BUN 19 26*  CREATININE 0.92 0.91  CALCIUM 8.6 8.3*     Recent Labs  08/04/13 0515 08/04/13 1215  AST 27 34  ALT 19 34  ALKPHOS 45 36*  BILITOT 1.3* 0.9  PROT 6.5 5.2*  ALBUMIN 3.5 2.6*     Recent Labs  08/04/13 0515  08/05/13 0557 08/06/13 0502  WBC 26.7*  < > 14.3* 11.2*  NEUTROABS 23.0*  --   --   --   HGB 16.3  < > 14.5 13.8  HCT 47.5  < > 44.9 41.5  MCV 78.6  < > 82.4 81.5  PLT 283  < > 209 224  < > = values in this interval not displayed.  Lab Results  Component Value Date   INR 1.30 08/05/2013   INR 0.95 07/29/2013   INR 0.93 12/09/2012    No results found for this basename: CKTOTAL, CKMB, CKMBINDEX, TROPONINI,  in the last 72 hours  No results found for this basename: TSH, T4TOTAL, FREET3, T3FREE,  THYROIDAB,  in the last 72 hours  No results found for this basename: VITAMINB12, FOLATE, FERRITIN, TIBC, IRON, RETICCTPCT,  in the last 72 hours  Micro Results: Recent Results (from the past 240 hour(s))  CLOSTRIDIUM DIFFICILE BY PCR     Status: None   Collection Time    08/05/13  5:15 AM      Result Value Ref Range Status   C difficile by pcr NEGATIVE  NEGATIVE Final   Comment: Performed at [facility]  ANAEROBIC CULTURE     Status: None   Collection Time    08/05/13  2:32 PM      Result Value Ref Range Status   Specimen Description FLUID BILE   Final   Special Requests BILE ASPIRATED FROM GALLBLADDER   Final   Gram Stain     Final   Value: NO WBC SEEN     NO ORGANISMS SEEN     Performed at [facility]   Culture     Final   Value: NO ANAEROBES ISOLATED; CULTURE IN PROGRESS FOR 5 DAYS     Performed at [facility]  Report Status PENDING   Incomplete  BODY FLUID CULTURE     Status: None   Collection Time    08/05/13  2:32 PM      Result Value Ref Range Status   Specimen Description FLUID BILE   Final   Special Requests BILE ASPIRATED FROM GALLBLADDER   Final   Gram Stain     Final   Value: NO WBC SEEN     NO ORGANISMS SEEN     [facility] at [facility]   Culture PENDING   Incomplete   Report Status PENDING   Incomplete    Studies/Results: Ct Head Wo Contrast  08/04/2013   CLINICAL DATA:  Slurred speech, dysarthria  EXAM: CT HEAD WITHOUT CONTRAST  TECHNIQUE: Contiguous axial images were obtained from the base of the skull through the vertex without intravenous contrast.  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 Chronic ischemia in the white matter left greater than right.  Ventricular enlargement secondary to atrophy. Negative for intracranial hemorrhage or mass.  No acute bony abnormality.  IMPRESSION: Atrophy and chronic ischemia.  No acute abnormality.   Electronically  Signed   By: Marlan Palauharles  Clark M.D.   On: 08/04/2013 13:52   Ir Perc Cholecystostomy  08/05/2013   CLINICAL DATA:  Cholelithiasis, acute cholecystitis and sepsis. The patient is not a candidate for cholecystectomy currently and requires placement of a percutaneous cholecystostomy tube.  EXAM: PERCUTANEOUS CHOECYSTOSTOMY  COMPARISON:  CT ABD/PELVIS W CM dated 08/04/2013; CT ABD/PELV WO CM dated 08/03/2013; US ABDOMEN LIMITED RUQ/ASCITES dated 08/03/2013  ANESTHESIA/SEDATION: No conscious sedation was administered.  CONTRAST:  5 mL Omnipaque 300  MEDICATIONS: No additional medications.  FLUOROSCOPY TIME:  12 seconds.  PROCEDURE: The procedure, risks, benefits, and alternatives were explained to the patient. Questions regarding the procedure were encouraged and answered. The patient understands and consents to the procedure.  The right abdominal wall was prepped with Betadine in a sterile fashion, and a sterile drape was applied covering the operative field. A sterile gown and sterile gloves were used for the procedure. Local anesthesia was provided with 1% Lidocaine. Ultrasound image documentation was performed. Fluoroscopy during the procedure and fluoro spot radiograph confirms appropriate catheter position.  Ultrasound was utilized to localize the gallbladder. Under direct ultrasound guidance, an 18 gauge needle was advanced via a transhepatic approach into the gallbladder lumen. Aspiration was performed and a bile sample sent for culture studies. A [place] amount of diluted contrast material was injected. A guide wire was then advanced into the gallbladder. A transitional dilator was placed.  Percutaneous tract dilatation was then performed over a guide wire to 10-French. A 10-French pigtail drainage catheter was then advanced into the gallbladder lumen under fluoroscopy. Catheter was formed and injected with contrast material to confirm position. The catheter was flushed and connected to a gravity drainage bag. It was  secured at the skin with a Prolene retention suture and Stat-Lock device.  COMPLICATIONS: None  FINDINGS: After needle puncture of the gallbladder, a bile sample was aspirated and sent for culture. The cholecystostomy tube was advanced into the gallbladder lumen and formed. It is now draining bile. This tube will be left to gravity drainage.  IMPRESSION: Percutaneous cholecystostomy with placement of 10-French drainage catheter into the gallbladder lumen. This was left to gravity drainage.   Electronically Signed   By: Irish LackGlenn  [name] M.D.   On: 08/05/2013 15:10     Medications: Scheduled: . aspirin  325 mg Oral Daily  . baclofen  20 mg Oral Daily  . ciprofloxacin  400 mg Intravenous Q12H  . cyanocobalamin  500 mcg Oral Daily  . Linaclotide  145 mcg Oral Daily  . metronidazole  500 mg Intravenous Q8H  . nebivolol  10 mg Oral Daily  . niacin  500 mg Oral QHS  . potassium chloride SA  40 mEq Oral BID  . simvastatin  20 mg Oral q1800   Continuous: . sodium chloride 125 mL/hr at 08/06/13 0600    Assessment/Plan:  Sepsis - resolved. WBC improving. VSS. Lactate normalized   Cholecystitis - cipro/flagyl/IVF. S/p perc drain 3/29. Advance diet to clears today  Diarrhea - c diff neg . Likely 2/2 abx   Dark urine - repeat U/A today w/ culture, hydrating, check CK level . If no improvement, may repeat CT A/P tomorrow   DVT Prophylaxis - SCDs    LOS: 2 days   Lenell Mcconnell 08/06/2013, 9:47 AM

## 2013-08-06 NOTE — Progress Notes (Signed)
Subjective: Still with some pain weak  Objective: Vital signs in last 24 hours: Temp:  [97.9 F (36.6 C)-98.7 F (37.1 C)] 97.9 F (36.6 C) (03/29 0603) Pulse Rate:  [71-82] 71 (03/29 0603) Resp:  [18-23] 20 (03/29 0603) BP: (104-132)/(62-77) 117/75 mmHg (03/29 0603) SpO2:  [91 %-96 %] 94 % (03/29 0603) Last BM Date: 08/04/13  Intake/Output from previous day: 03/28 0701 - 03/29 0700 In: 2185.4 [I.V.:1685.4; IV Piggyback:500] Out: 200 [Urine:100; Drains:100] Intake/Output this shift:    Still looks weak Abdomen still with moderate RUQ and epigastric tenderness Perc drain with bile  Lab Results:   Recent Labs  08/05/13 0557 08/06/13 0502  WBC 14.3* 11.2*  HGB 14.5 13.8  HCT 44.9 41.5  PLT 209 224   BMET  Recent Labs  08/05/13 0557 08/06/13 0502  NA 137 140  K 4.1 3.5*  CL 107 112  CO2 19 21  GLUCOSE 125* 94  BUN 19 26*  CREATININE 0.92 0.91  CALCIUM 8.6 8.3*   PT/INR  Recent Labs  08/05/13 1018  LABPROT 15.9*  INR 1.30   ABG No results found for this basename: PHART, PCO2, PO2, HCO3,  in the last 72 hours  Studies/Results: Ct Head Wo Contrast  08/04/2013   CLINICAL DATA:  Slurred speech, dysarthria  EXAM: CT HEAD WITHOUT CONTRAST  TECHNIQUE: Contiguous axial images were obtained from the base of the skull through the vertex without intravenous contrast.  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 Chronic ischemia in the white matter left greater than right.  Ventricular enlargement secondary to atrophy. Negative for intracranial hemorrhage or mass.  No acute bony abnormality.  IMPRESSION: Atrophy and chronic ischemia.  No acute abnormality.   Electronically Signed   By: Marlan Palauharles  Clark M.D.   On: 08/04/2013 13:52   Ct Abdomen Pelvis W Contrast  08/04/2013   CLINICAL DATA:  Abdominal pain, epigastric and right side pain, vomiting, history right-side hemiplegia, hypertension, hiatal  hernia, cholelithiasis  EXAM: CT ABDOMEN AND PELVIS WITH CONTRAST  TECHNIQUE: Multidetector CT imaging of the abdomen and pelvis was performed using the standard protocol following bolus administration of intravenous contrast. Sagittal and coronal MPR images reconstructed from axial data set.  CONTRAST:  100mL OMNIPAQUE IOHEXOL 300 MG/ML SOLN IV. No oral contrast.  COMPARISON:  08/03/2013 abdominal CT and right upper quadrant ultrasound  FINDINGS: Minchew right pleural effusion and right lower lobe atelectasis.  Large hiatal hernia.  Significantly distended gallbladder containing a dependent calcified gallstone.  Gallbladder wall thickening with moderate pericholecystic edema, progressive.  Ureste amount of perihepatic free fluid, new.  Increased edema adjacent to duodenum, pancreatic head, colon, stomach, and in porta hepatis.  Findings are most consistent with acute cholecystitis progressive since previous study.  Liver, spleen, pancreas, and adrenal glands otherwise unremarkable.  Symmetric nephrograms with bilateral peripelvic renal cysts.  Stomach and bowel loops otherwise unremarkable.  Dilated inguinal canals containing fat question hernia.  Minimal prostatic enlargement.  Unremarkable appendix, bladder, and ureters.  Minimal pelvic free fluid.  No mass, adenopathy, or free air.  Tiny focus of gas seen within gallbladder on previous exam no longer identified.  Scattered degenerative disc disease changes lumbar spine.  IMPRESSION: Distended gallbladder with cholelithiasis, gallbladder wall thickening and increased  pericholecystic/right upper quadrant edema since previous study consistent with acute cholecystitis.  Koors amount of free intraperitoneal fluid now identified.  Large hiatal hernia.  Bilateral peripelvic renal cysts.  Golliday right pleural effusion  and increased right lower lobe atelectasis.   Electronically Signed   By: Ulyses Southward M.D.   On: 08/04/2013 09:40   Ir Perc Cholecystostomy  08/05/2013    CLINICAL DATA:  Cholelithiasis, acute cholecystitis and sepsis. The patient is not a candidate for cholecystectomy currently and requires placement of a percutaneous cholecystostomy tube.  EXAM: PERCUTANEOUS CHOECYSTOSTOMY  COMPARISON:  CT ABD/PELVIS W CM dated 08/04/2013; CT ABD/PELV WO CM dated 08/03/2013; US ABDOMEN LIMITED RUQ/ASCITES dated 08/03/2013  ANESTHESIA/SEDATION: No conscious sedation was administered.  CONTRAST:  5 mL Omnipaque 300  MEDICATIONS: No additional medications.  FLUOROSCOPY TIME:  12 seconds.  PROCEDURE: The procedure, risks, benefits, and alternatives were explained to the patient. Questions regarding the procedure were encouraged and answered. The patient understands and consents to the procedure.  The right abdominal wall was prepped with Betadine in a sterile fashion, and a sterile drape was applied covering the operative field. A sterile gown and sterile gloves were used for the procedure. Local anesthesia was provided with 1% Lidocaine. Ultrasound image documentation was performed. Fluoroscopy during the procedure and fluoro spot radiograph confirms appropriate catheter position.  Ultrasound was utilized to localize the gallbladder. Under direct ultrasound guidance, an 18 gauge needle was advanced via a transhepatic approach into the gallbladder lumen. Aspiration was performed and a bile sample sent for culture studies. A [place] amount of diluted contrast material was injected. A guide wire was then advanced into the gallbladder. A transitional dilator was placed.  Percutaneous tract dilatation was then performed over a guide wire to 10-French. A 10-French pigtail drainage catheter was then advanced into the gallbladder lumen under fluoroscopy. Catheter was formed and injected with contrast material to confirm position. The catheter was flushed and connected to a gravity drainage bag. It was secured at the skin with a Prolene retention suture and Stat-Lock device.  COMPLICATIONS: None   FINDINGS: After needle puncture of the gallbladder, a bile sample was aspirated and sent for culture. The cholecystostomy tube was advanced into the gallbladder lumen and formed. It is now draining bile. This tube will be left to gravity drainage.  IMPRESSION: Percutaneous cholecystostomy with placement of 10-French drainage catheter into the gallbladder lumen. This was left to gravity drainage.   Electronically Signed   By: Irish Lack M.D.   On: 08/05/2013 15:10    Anti-infectives: Anti-infectives   Start     Dose/Rate Route Frequency Ordered Stop   08/04/13 1000  ciprofloxacin (CIPRO) IVPB 400 mg     400 mg 200 mL/hr over 60 Minutes Intravenous Every 12 hours 08/04/13 0911     08/04/13 1000  metroNIDAZOLE (FLAGYL) IVPB 500 mg     500 mg 100 mL/hr over 60 Minutes Intravenous Every 8 hours 08/04/13 0911     08/04/13 0645  ciprofloxacin (CIPRO) IVPB 400 mg     400 mg 200 mL/hr over 60 Minutes Intravenous  Once 08/04/13 0644 08/04/13 4132      Assessment/Plan:  Cholelithiasis with cholecystitis  Continue IV antibiotics and perc drain.  WBC improving but clinically still pretty tender.  LOS: 2 days    Shaylah Mcghie A 08/06/2013

## 2013-08-06 NOTE — Progress Notes (Signed)
Left IV site swollen.  IV removed and left arm elevated.  No redness noted.  NS infusing at 125 hour.  Tried to start new IV in left hand but was unable to.  Have asked IV nurse to try for me.

## 2013-08-06 NOTE — Progress Notes (Addendum)
Subjective: Pt states feeling better overall, still with some pain. Would expect some soreness with drain for first few days.   Objective: Physical Exam: BP 117/75  Pulse 71  Temp(Src) 97.9 F (36.6 C) (Oral)  Resp 20  Ht 6\' 2"  (1.88 m)  Wt 186 lb 8.2 oz (84.6 kg)  BMI 23.94 kg/m2  SpO2 94% RUQ drain intact, site clean, minimally tender Output dark bilious, emptied this am, and at least another in bag now.    Labs: CBC  Recent Labs  08/05/13 0557 08/06/13 0502  WBC 14.3* 11.2*  HGB 14.5 13.8  HCT 44.9 41.5  PLT 209 224   BMET  Recent Labs  08/05/13 0557 08/06/13 0502  NA 137 140  K 4.1 3.5*  CL 107 112  CO2 19 21  GLUCOSE 125* 94  BUN 19 26*  CREATININE 0.92 0.91  CALCIUM 8.6 8.3*   LFT  Recent Labs  08/04/13 1215 08/06/13 0502  PROT 5.2*  --   ALBUMIN 2.6*  --   AST 34  --   ALT 34  --   ALKPHOS 36*  --   BILITOT 0.9  --   LIPASE 8* 8*   PT/INR  Recent Labs  08/05/13 1018  LABPROT 15.9*  INR 1.30     Studies/Results: Ct Head Wo Contrast  08/04/2013   CLINICAL DATA:  Slurred speech, dysarthria  EXAM: CT HEAD WITHOUT CONTRAST  TECHNIQUE: Contiguous axial images were obtained from the base of the skull through the vertex without intravenous contrast.  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 Chronic ischemia in the white matter left greater than right.  Ventricular enlargement secondary to atrophy. Negative for intracranial hemorrhage or mass.  No acute bony abnormality.  IMPRESSION: Atrophy and chronic ischemia.  No acute abnormality.   Electronically Signed   By: Marlan Palau M.D.   On: 08/04/2013 13:52   Ir Perc Cholecystostomy  08/05/2013   CLINICAL DATA:  Cholelithiasis, acute cholecystitis and sepsis. The patient is not a candidate for cholecystectomy currently and requires placement of a percutaneous cholecystostomy tube.  EXAM: PERCUTANEOUS  CHOECYSTOSTOMY  COMPARISON:  CT ABD/PELVIS W CM dated 08/04/2013; CT ABD/PELV WO CM dated 08/03/2013; US ABDOMEN LIMITED RUQ/ASCITES dated 08/03/2013  ANESTHESIA/SEDATION: No conscious sedation was administered.  CONTRAST:  5 mL Omnipaque 300  MEDICATIONS: No additional medications.  FLUOROSCOPY TIME:  12 seconds.  PROCEDURE: The procedure, risks, benefits, and alternatives were explained to the patient. Questions regarding the procedure were encouraged and answered. The patient understands and consents to the procedure.  The right abdominal wall was prepped with Betadine in a sterile fashion, and a sterile drape was applied covering the operative field. A sterile gown and sterile gloves were used for the procedure. Local anesthesia was provided with 1% Lidocaine. Ultrasound image documentation was performed. Fluoroscopy during the procedure and fluoro spot radiograph confirms appropriate catheter position.  Ultrasound was utilized to localize the gallbladder. Under direct ultrasound guidance, an 18 gauge needle was advanced via a transhepatic approach into the gallbladder lumen. Aspiration was performed and a bile sample sent for culture studies. A [place] amount of diluted contrast material was injected. A guide wire was then advanced into the gallbladder. A transitional dilator was placed.  Percutaneous tract dilatation was then performed over a guide wire to 10-French. A 10-French pigtail drainage catheter was then advanced into the gallbladder lumen under fluoroscopy. Catheter was formed and injected with  contrast material to confirm position. The catheter was flushed and connected to a gravity drainage bag. It was secured at the skin with a Prolene retention suture and Stat-Lock device.  COMPLICATIONS: None  FINDINGS: After needle puncture of the gallbladder, a bile sample was aspirated and sent for culture. The cholecystostomy tube was advanced into the gallbladder lumen and formed. It is now draining bile. This  tube will be left to gravity drainage.  IMPRESSION: Percutaneous cholecystostomy with placement of 10-French drainage catheter into the gallbladder lumen. This was left to gravity drainage.   Electronically Signed   By: Irish LackGlenn  [name] M.D.   On: 08/05/2013 15:10    Assessment/Plan: Acute cholecystitis S/p perc transhepatic cholecystostomy drain placement 3/29 Cont drain care Drain would need to remain minimum of 6 weeks unless he requires surgery prior to then. Other care/recs per [facility]    LOS: 2 days    Brayton ElBRUNING, Glada Wickstrom PA-C 08/06/2013 11:23 AM

## 2013-08-06 NOTE — Progress Notes (Signed)
Had second RN look for any vein to start an IV in and she is unable to find one at this time.  Pandora JacCindra Eveskson RN and Elenore Rotaynthia Brance Dartt RN have both tried and have been unable to restart a new IV.

## 2013-08-07 LAB — CBC
HCT: 37.5 % — ABNORMAL LOW (ref 39.0–52.0)
Hemoglobin: 11.9 g/dL — ABNORMAL LOW (ref 13.0–17.0)
MCH: 26.3 pg (ref 26.0–34.0)
MCHC: 31.7 g/dL (ref 30.0–36.0)
MCV: 82.8 fL (ref 78.0–100.0)
Platelets: 216 10*3/uL (ref 150–400)
RBC: 4.53 MIL/uL (ref 4.22–5.81)
RDW: 16.2 % — ABNORMAL HIGH (ref 11.5–15.5)
WBC: 8.9 10*3/uL (ref 4.0–10.5)

## 2013-08-07 LAB — BASIC METABOLIC PANEL
BUN: 18 mg/dL (ref 6–23)
CO2: 18 meq/L — AB (ref 19–32)
CREATININE: 0.82 mg/dL (ref 0.50–1.35)
Calcium: 8 mg/dL — ABNORMAL LOW (ref 8.4–10.5)
Chloride: 111 mEq/L (ref 96–112)
GFR calc Af Amer: >90 mL/min (ref >90)
GFR calc non Af Amer: 84 mL/min — ABNORMAL LOW (ref >90)
Glucose, Bld: 94 mg/dL (ref 70–99)
Potassium: 3.8 mEq/L (ref 3.7–5.3)
Sodium: 139 mEq/L (ref 137–147)

## 2013-08-07 MED ORDER — METRONIDAZOLE 500 MG PO TABS
500.0000 mg | ORAL_TABLET | Freq: Three times a day (TID) | ORAL | Status: DC
  Administered 2013-08-07 – 2013-08-09 (×6): 500 mg via ORAL
  Filled 2013-08-07 (×9): qty 1

## 2013-08-07 MED ORDER — CIPROFLOXACIN HCL 500 MG PO TABS
500.0000 mg | ORAL_TABLET | Freq: Two times a day (BID) | ORAL | Status: DC
  Administered 2013-08-07 – 2013-08-09 (×5): 500 mg via ORAL
  Filled 2013-08-07 (×7): qty 1

## 2013-08-07 NOTE — Progress Notes (Addendum)
Clinical Social Work Department BRIEF PSYCHOSOCIAL ASSESSMENT 08/07/2013  Patient:  [name], ROUCH     Account Number:  [id number]     Admit date:  08/04/2013  Clinical Social Worker:  Earlie Server  Date/Time:  08/07/2013 12:00 N  Referred by:  Physician  Date Referred:  08/07/2013 Referred for  SNF Placement   Other Referral:   Interview type:  Patient Other interview type:    PSYCHOSOCIAL DATA Living Status:  FAMILY Admitted from facility:   Level of care:   Primary support name:  Romie Minus Primary support relationship to patient:  SPOUSE Degree of support available:   Strong    CURRENT CONCERNS Current Concerns  Post-Acute Placement   Other Concerns:    SOCIAL WORK ASSESSMENT / PLAN CSW received referral in order to complete assessment to assist with DC planning. Per chart review, PT recommending SNF placement at DC. CSW met with patient and wife at bedside in order to discuss options. CSW introduced myself and explained role.    Patient lives at home with wife. Wife reports that patient is deconditionoed and she agrees with placement at SNF at DC. CSW provided list and explained DC process along with insurance approval needed. Wife prefers placement in [place] so that she can visit. Patient has been to CIR in the past but not SNF. Patient and wife agreeable to [facility] search and has CSW contact information if further needs arise.    CSW completed FL2 and faxed out. CSW will follow up with bed offers. Clinicals submitted to insurance company for authorization.   Assessment/plan status:  Psychosocial Support/Ongoing Assessment of Needs Other assessment/ plan:   Information/referral to community resources:   SNF list    PATIENT'S/FAMILY'S RESPONSE TO PLAN OF CARE: Patient alert but somewhat confused. Patient unable to fully participate in assessment and allows wife to answer most questions. Wife aware of plans and feels that patient will greatly benefit from SNF  and agreeable to plans. Wife is worried about being able to visit as often as she would like and wants him as close to home as possible. Wife agreeable to plans and thanked CSW for visiting with her.       Eckhart Mines, [place] [phone]

## 2013-08-07 NOTE — Progress Notes (Addendum)
Clinical Social Work Department CLINICAL SOCIAL WORK PLACEMENT NOTE 08/07/2013  Patient:  [name],[name]  Account Number:  [id number] Admit date:  08/04/2013  Clinical Social Worker:  Unk LightningHOLLY Nazirah Tri, LCSW  Date/time:  08/07/2013 10:00 AM  Clinical Social Work is seeking post-discharge placement for this patient at the following level of care:   SKILLED NURSING   (*CSW will update this form in Epic as items are completed)   08/07/2013  Patient/family provided with [facility] Department of Clinical Social Work's list of facilities offering this level of care within the geographic area requested by the patient (or if unable, by the patient's family).  08/07/2013  Patient/family informed of their freedom to choose among providers that offer the needed level of care, that participate in Medicare, Medicaid or managed care program needed by the patient, have an available bed and are willing to accept the patient.  08/07/2013  Patient/family informed of [facility]' ownership interest in [facility], as well as of the fact that they are under no obligation to receive care at this facility.  PASARR submitted to EDS on 08/07/2013 PASARR number received from EDS on 08/07/2013  FL2 transmitted to all facilities in geographic area requested by pt/family on  08/07/2013 FL2 transmitted to all facilities within larger geographic area on   Patient informed that his/her managed care company has contracts with or will negotiate with  certain facilities, including the following:     Patient/family informed of bed offers received:  08/08/13 [facility] Physician recommends and patient chooses bed at    [facility]  08/09/13 Patient [facility] be transferred to [facility]  The following physician request were entered in Epic:   Additional Comments:

## 2013-08-07 NOTE — Progress Notes (Signed)
INITIAL NUTRITION ASSESSMENT  DOCUMENTATION CODES Per approved criteria  -Not Applicable   INTERVENTION: - Diet advancement per MD - Educated wife on low fat diet for cholelithiasis - Recommend Ensure Complete BID once diet advanced - Will continue to monitor   NUTRITION DIAGNOSIS: Inadequate oral intake related to clear liquid diet as evidenced by diet order.    Goal: Advance diet as tolerated to low fat diet  Monitor:  Weights, labs, diet advancement  Reason for Assessment: Low braden  78 y.o. male  Admitting Dx: Cholecystitis  ASSESSMENT: Admitted with abdominal pain and elevated lipase, found to have gallstones 08/03/13. Hx of right sided hemiplegia, TBI, and dementia. Had percutaneous cholecystotomy 3/28 that showed bile in gallbladder was dark, black in color, with numerous calculi.   Met with pt and wife who report pt eating healthy at home with usual intake consists of a healthy diet of baked meats (typically chicken and fish that are either baked/boiled), vegetables, fruits, and low fat dairy. Wife reports pt's appetite has been down for the past month with pt consuming 50% of meals however reports stable weight. Drinks 2 Ensure/day. Hx of esophageal stretching 10 years ago and on/off c/o esophageal dysphagia. Seen by SLP today. Pt without upper teeth, wife has been modifying diet accordingly.   Lipase yesterday low Alk phos low  Height: Ht Readings from Last 1 Encounters:  08/04/13 '6\' 2"'  (1.88 m)    Weight: Wt Readings from Last 1 Encounters:  08/04/13 186 lb 8.2 oz (84.6 kg)    Ideal Body Weight: 190 lbs  % Ideal Body Weight: 98%  Wt Readings from Last 10 Encounters:  08/04/13 186 lb 8.2 oz (84.6 kg)  08/04/13 186 lb 8.2 oz (84.6 kg)  07/29/13 185 lb (83.915 kg)  11/29/12 198 lb (89.812 kg)  08/21/11 180 lb (81.647 kg)    Usual Body Weight: 186 lbs per pt and wife  % Usual Body Weight: 100%  BMI:  Body mass index is 23.94 kg/(m^2).  Estimated  Nutritional Needs: Kcal: 2150-2350 Protein: 105-125g Fluid: 2.1-2.3L/day  Skin: Intact   Diet Order: Clear Liquid  EDUCATION NEEDS: -No education needs identified at this time   Intake/Output Summary (Last 24 hours) at 08/07/13 1400 Last data filed at 08/07/13 0947  Gross per 24 hour  Intake 1633.75 ml  Output    770 ml  Net 863.75 ml    Last BM: 3/27   Labs:   Recent Labs Lab 08/05/13 0557 08/06/13 0502 08/07/13 0440  NA 137 140 139  K 4.1 3.5* 3.8  CL 107 112 111  CO2 19 21 18*  BUN 19 26* 18  CREATININE 0.92 0.91 0.82  CALCIUM 8.6 8.3* 8.0*  GLUCOSE 125* 94 94    CBG (last 3)  No results found for this basename: GLUCAP,  in the last 72 hours  Scheduled Meds: . aspirin  325 mg Oral Daily  . baclofen  20 mg Oral Daily  . ciprofloxacin  500 mg Oral BID  . cyanocobalamin  500 mcg Oral Daily  . Linaclotide  145 mcg Oral Daily  . lip balm  1 application Topical BID  . metroNIDAZOLE  500 mg Oral 3 times per day  . nebivolol  10 mg Oral Daily  . niacin  500 mg Oral QHS  . saccharomyces boulardii  250 mg Oral BID  . simvastatin  20 mg Oral q1800    Continuous Infusions: . sodium chloride 125 mL/hr at 08/07/13 0200    Past  Medical History  Diagnosis Date  . Hemiplegia   . Vertigo   . Hypertension   . Restless leg syndrome   . TBI (traumatic brain injury)     WITH LEFT HEMISPHERIC BLEED  . Depression with anxiety   . High cholesterol   . Chronic pain     lower back  . Dementia   . Tremor     familiar  . Swelling of upper lip     tongue  . Weakness of both legs   . Weakness generalized 12/22/2012  . Dementia with behavioral disturbance 04/18/2013    Past Surgical History  Procedure Laterality Date  . Back surgery    . Total knee arthroplasty    . Hernia repair  1985  . Laminectomy  2006  . Colon resection  1996  . Dental surgery  3/14    all upper teeth removed    Mikey College MS, RD, [place] Pager [phone] After Hours  Pager
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
## 2013-08-07 NOTE — Progress Notes (Signed)
Subjective: Pt confused this am, but denies pain Would expect some soreness with drain for first few days.  Objective: Physical Exam: BP 126/65  Pulse 63  Temp(Src) 98.3 F (36.8 C) (Oral)  Resp 20  Ht 6\' 2"  (1.88 m)  Wt 186 lb 8.2 oz (84.6 kg)  BMI 23.94 kg/m2  SpO2 97% RUQ drain intact, site clean, minimally tender, no leak or bleed Output dark bilious, ~640mL recorded past 24 hr    Labs: CBC  Recent Labs  08/06/13 0502 08/07/13 0440  WBC 11.2* 8.9  HGB 13.8 11.9*  HCT 41.5 37.5*  PLT 224 216   BMET  Recent Labs  08/06/13 0502 08/07/13 0440  NA 140 139  K 3.5* 3.8  CL 112 111  CO2 21 18*  GLUCOSE 94 94  BUN 26* 18  CREATININE 0.91 0.82  CALCIUM 8.3* 8.0*   LFT  Recent Labs  08/06/13 0502 08/06/13 1104  PROT  --  4.9*  ALBUMIN  --  2.1*  AST  --  21  ALT  --  23  ALKPHOS  --  35*  BILITOT  --  0.5  BILIDIR  --  <0.2  IBILI  --  NOT CALCULATED  LIPASE 8*  --    PT/INR  Recent Labs  08/05/13 1018  LABPROT 15.9*  INR 1.30     Studies/Results: Ir Perc Cholecystostomy  08/05/2013   CLINICAL DATA:  Cholelithiasis, acute cholecystitis and sepsis. The patient is not a candidate for cholecystectomy currently and requires placement of a percutaneous cholecystostomy tube.  EXAM: PERCUTANEOUS CHOECYSTOSTOMY  COMPARISON:  CT ABD/PELVIS W CM dated 08/04/2013; CT ABD/PELV WO CM dated 08/03/2013; US ABDOMEN LIMITED RUQ/ASCITES dated 08/03/2013  ANESTHESIA/SEDATION: No conscious sedation was administered.  CONTRAST:  5 mL Omnipaque 300  MEDICATIONS: No additional medications.  FLUOROSCOPY TIME:  12 seconds.  PROCEDURE: The procedure, risks, benefits, and alternatives were explained to the patient. Questions regarding the procedure were encouraged and answered. The patient understands and consents to the procedure.  The right abdominal wall was prepped with Betadine in a sterile fashion, and a sterile drape was applied covering the operative field. A sterile  gown and sterile gloves were used for the procedure. Local anesthesia was provided with 1% Lidocaine. Ultrasound image documentation was performed. Fluoroscopy during the procedure and fluoro spot radiograph confirms appropriate catheter position.  Ultrasound was utilized to localize the gallbladder. Under direct ultrasound guidance, an 18 gauge needle was advanced via a transhepatic approach into the gallbladder lumen. Aspiration was performed and a bile sample sent for culture studies. A Feild amount of diluted contrast material was injected. A guide wire was then advanced into the gallbladder. A transitional dilator was placed.  Percutaneous tract dilatation was then performed over a guide wire to 10-French. A 10-French pigtail drainage catheter was then advanced into the gallbladder lumen under fluoroscopy. Catheter was formed and injected with contrast material to confirm position. The catheter was flushed and connected to a gravity drainage bag. It was secured at the skin with a Prolene retention suture and Stat-Lock device.  COMPLICATIONS: None  FINDINGS: After needle puncture of the gallbladder, a bile sample was aspirated and sent for culture. The cholecystostomy tube was advanced into the gallbladder lumen and formed. It is now draining bile. This tube will be left to gravity drainage.  IMPRESSION: Percutaneous cholecystostomy with placement of 10-French drainage catheter into the gallbladder lumen. This was left to gravity drainage.   Electronically Signed   By: Sherrine Maples  Fredia SorrowYamagata M.D.   On: 08/05/2013 15:10   Dg Chest Port 1 View  08/06/2013   CLINICAL DATA:  Wheezing.  Questionable aspiration.  EXAM: PORTABLE CHEST - 1 VIEW  COMPARISON:  08/04/2013  FINDINGS: There is new right lung base opacity. This partly obscures the right hemidiaphragm. This may reflect aspiration pneumonitis. It may all be atelectasis. There is mild left medial lung base atelectasis.  Cardiac silhouette is normal in size. Moderate  to large hiatal hernia. No other mediastinal masses.  No pulmonary edema.  No pneumothorax.  IMPRESSION: 1. New right lung base opacity. This may all be atelectasis, but is new from the prior study. This could reflect that aspiration pneumonitis.   Electronically Signed   By: Amie Portlandavid  Ormond M.D.   On: 08/06/2013 19:33    Assessment/Plan: Acute cholecystitis S/p perc transhepatic cholecystostomy drain placement 3/29 WBC nl at 8.9 Cont drain care, recommend daily flush with 5cc NS after discharge Drain would need to remain minimum of 6 weeks unless he requires surgery prior to then. Other care/recs per [facility] Call IR if needed.    LOS: 3 days    Brayton ElBRUNING, Orabelle Rylee PA-C 08/07/2013 9:37 AM

## 2013-08-07 NOTE — Progress Notes (Signed)
BSE completed, full report to follow.  Pt with h/o esophageal dysphagia per spouse requiring dilatations approx 10 years ago.    Pt presents with moderate lingual weakness resulting in discoordination and delayed transiting.  Lingual deviation to right upon protrusion, minimal palatal elevation deviation to left upon phonation.  Anterior labial spillage on right noted *wife states pt drools at home.    Minimal cough x2 during entire lunch meal (of approx 25 swallows) - occuring once immediately after belching - pt admitted to reflux issues.    If MD desires, MBS may be conducted to assess oropharyngeal swallow given neuro hx and chest findings.    Advised spouse/pt to continue clear liquid diet with strict aspiration precautions.  Suspect pt has component of acute on chronic dysphagia from his previous neuro injury.  Spouse has been chopping foods/meats finely due to pt's upper teeth being removed.    Recommendations: Clears No straws Medicine with applesauce, start and follow with water STRICT asp/reflux precautions   Donavan Burnetamara Taurus [place], MS [facility] SLP [phone]
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
## 2013-08-07 NOTE — Progress Notes (Signed)
Physician Daily Progress Note  Subjective: Difficult case to communicate with  Does not have abd pain on exam today In no distress today  Nursing states no BM in 24 hours, urine clearing   Objective: Vital signs in last 24 hours: Temp:  [97.9 F (36.6 C)-98.9 F (37.2 C)] 97.9 F (36.6 C) (03/30 1028) Pulse Rate:  [63-69] 66 (03/30 1028) Resp:  [16-20] 18 (03/30 1028) BP: (112-126)/(65-80) 112/77 mmHg (03/30 1028) SpO2:  [94 %-99 %] 99 % (03/30 1028) Weight change:  Last BM Date: 08/04/13  CBG (last 3)  No results found for this basename: GLUCAP,  in the last 72 hours  Intake/Output from previous day:  Intake/Output Summary (Last 24 hours) at 08/07/13 1036 Last data filed at 08/07/13 0947  Gross per 24 hour  Intake 1993.75 ml  Output    770 ml  Net 1223.75 ml   03/29 0701 - 03/30 0700 In: 2353.8 [P.O.:840; I.V.:1513.8] Out: 995 [Urine:350; Drains:645]  Physical Exam Gen: WM in NAD  HEENT:anicteric  Lungs:CTAB, no wheezes, rales  Cardio: RRR, no MRG  Abd: Soft, not TTP ,  no masses , perc drain in RUQ w/ light brown drainage  MSK: weakness of LE B/L  Neuro: no focal deficits  Skin:warm, dry     Lab Results:  Recent Labs  08/06/13 0502 08/07/13 0440  NA 140 139  K 3.5* 3.8  CL 112 111  CO2 21 18*  GLUCOSE 94 94  BUN 26* 18  CREATININE 0.91 0.82  CALCIUM 8.3* 8.0*     Recent Labs  08/04/13 1215 08/06/13 1104  AST 34 21  ALT 34 23  ALKPHOS 36* 35*  BILITOT 0.9 0.5  PROT 5.2* 4.9*  ALBUMIN 2.6* 2.1*     Recent Labs  08/06/13 0502 08/07/13 0440  WBC 11.2* 8.9  HGB 13.8 11.9*  HCT 41.5 37.5*  MCV 81.5 82.8  PLT 224 216    Lab Results  Component Value Date   INR 1.30 08/05/2013   INR 0.95 07/29/2013   INR 0.93 12/09/2012     Recent Labs  08/06/13 1104  CKTOTAL 54    No results found for this basename: TSH, T4TOTAL, FREET3, T3FREE, THYROIDAB,  in the last 72 hours  No results found for this basename: VITAMINB12, FOLATE,  FERRITIN, TIBC, IRON, RETICCTPCT,  in the last 72 hours  Micro Results: Recent Results (from the past 240 hour(s))  CLOSTRIDIUM DIFFICILE BY PCR     Status: None   Collection Time    08/05/13  5:15 AM      Result Value Ref Range Status   C difficile by pcr NEGATIVE  NEGATIVE Final   Comment[facility]  ANAEROBIC CULTURE     Status: None   Collection Time    08/05/13  2:32 PM      Result Value Ref Range Status   Specimen Description FLUID BILE   Final   Special Requests BILE ASPIRATED FROM GALLBLADDER   Final   Gram Stain     Final   Value: NO WBC SEEN     NO ORGANISMS SEEN     Performed at [facility]   Culture     Final   Value: NO ANAEROBES ISOLATED; CULTURE IN PROGRESS FOR 5 DAYS     Performed at [facility]   Report Status PENDING   Incomplete  BODY FLUID CULTURE     Status: None   Collection Time  08/05/13  2:32 PM      Result Value Ref Range Status   Specimen Description FLUID BILE   Final   Special Requests BILE ASPIRATED FROM GALLBLADDER   Final   Gram Stain     Final   Value: NO WBC SEEN     NO ORGANISMS SEEN     Performed at [facility]   Culture     Final   Value: NO GROWTH 2 DAYS     Performed at [facility]   Report Status PENDING   Incomplete    Studies/Results: Ir Perc Cholecystostomy  08/05/2013   CLINICAL DATA:  Cholelithiasis, acute cholecystitis and sepsis. The patient is not a candidate for cholecystectomy currently and requires placement of a percutaneous cholecystostomy tube.  EXAM: PERCUTANEOUS CHOECYSTOSTOMY  COMPARISON:  CT ABD/PELVIS W CM dated 08/04/2013; CT ABD/PELV WO CM dated 08/03/2013; US ABDOMEN LIMITED RUQ/ASCITES dated 08/03/2013  ANESTHESIA/SEDATION: No conscious sedation was administered.  CONTRAST:  5 mL Omnipaque 300  MEDICATIONS: No additional medications.  FLUOROSCOPY TIME:  12 seconds.  PROCEDURE: The procedure, risks, benefits, and alternatives were explained to the patient.  Questions regarding the procedure were encouraged and answered. The patient understands and consents to the procedure.  The right abdominal wall was prepped with Betadine in a sterile fashion, and a sterile drape was applied covering the operative field. A sterile gown and sterile gloves were used for the procedure. Local anesthesia was provided with 1% Lidocaine. Ultrasound image documentation was performed. Fluoroscopy during the procedure and fluoro spot radiograph confirms appropriate catheter position.  Ultrasound was utilized to localize the gallbladder. Under direct ultrasound guidance, an 18 gauge needle was advanced via a transhepatic approach into the gallbladder lumen. Aspiration was performed and a bile sample sent for culture studies. A Hsiung amount of diluted contrast material was injected. A guide wire was then advanced into the gallbladder. A transitional dilator was placed.  Percutaneous tract dilatation was then performed over a guide wire to 10-French. A 10-French pigtail drainage catheter was then advanced into the gallbladder lumen under fluoroscopy. Catheter was formed and injected with contrast material to confirm position. The catheter was flushed and connected to a gravity drainage bag. It was secured at the skin with a Prolene retention suture and Stat-Lock device.  COMPLICATIONS: None  FINDINGS: After needle puncture of the gallbladder, a bile sample was aspirated and sent for culture. The cholecystostomy tube was advanced into the gallbladder lumen and formed. It is now draining bile. This tube will be left to gravity drainage.  IMPRESSION: Percutaneous cholecystostomy with placement of 10-French drainage catheter into the gallbladder lumen. This was left to gravity drainage.   Electronically Signed   By: Irish Lack M.D.   On: 08/05/2013 15:10   Dg Chest Port 1 View  08/06/2013   CLINICAL DATA:  Wheezing.  Questionable aspiration.  EXAM: PORTABLE CHEST - 1 VIEW  COMPARISON:   08/04/2013  FINDINGS: There is new right lung base opacity. This partly obscures the right hemidiaphragm. This may reflect aspiration pneumonitis. It may all be atelectasis. There is mild left medial lung base atelectasis.  Cardiac silhouette is normal in size. Moderate to large hiatal hernia. No other mediastinal masses.  No pulmonary edema.  No pneumothorax.  IMPRESSION: 1. New right lung base opacity. This may all be atelectasis, but is new from the prior study. This could reflect that aspiration pneumonitis.   Electronically Signed   By: Amie Portland M.D.   On:  08/06/2013 19:33     Medications: Scheduled: . aspirin  325 mg Oral Daily  . baclofen  20 mg Oral Daily  . ciprofloxacin  500 mg Oral BID  . cyanocobalamin  500 mcg Oral Daily  . Linaclotide  145 mcg Oral Daily  . lip balm  1 application Topical BID  . metroNIDAZOLE  500 mg Oral 3 times per day  . nebivolol  10 mg Oral Daily  . niacin  500 mg Oral QHS  . saccharomyces boulardii  250 mg Oral BID  . simvastatin  20 mg Oral q1800   Continuous: . sodium chloride 125 mL/hr at 08/07/13 0200    Assessment/Plan:  Sepsis - resolved.    Cholecystitis - improving. changing cipro/flagyl to PO today. Cont IVF. S/p perc drain 3/29. Advance diet as tolerated . IR recommends  daily flush of catheter with 5cc NS after discharge  Dysphagia - consulting speech for swallow study today . Advance diet as tolerated   Diarrhea - c diff neg . Resolved   Dark urine - bilirubinuria . Improving   Dispo - SW working on SNF placement   DVT Prophylaxis - SCDs    LOS: 3 days   [name] 08/07/2013, 10:36 AM

## 2013-08-08 LAB — BASIC METABOLIC PANEL
BUN: 12 mg/dL (ref 6–23)
CO2: 19 mEq/L (ref 19–32)
Calcium: 7.7 mg/dL — ABNORMAL LOW (ref 8.4–10.5)
Chloride: 116 mEq/L — ABNORMAL HIGH (ref 96–112)
Creatinine, Ser: 0.72 mg/dL (ref 0.50–1.35)
GFR, EST NON AFRICAN AMERICAN: 88 mL/min — AB (ref >90)
Glucose, Bld: 110 mg/dL — ABNORMAL HIGH (ref 70–99)
Potassium: 3.7 mEq/L (ref 3.7–5.3)
Sodium: 143 mEq/L (ref 137–147)

## 2013-08-08 LAB — BODY FLUID CULTURE
Culture: NO GROWTH
Gram Stain: NONE SEEN

## 2013-08-08 LAB — CBC
HCT: 36.9 % — ABNORMAL LOW (ref 39.0–52.0)
Hemoglobin: 11.9 g/dL — ABNORMAL LOW (ref 13.0–17.0)
MCH: 26.6 pg (ref 26.0–34.0)
MCHC: 32.2 g/dL (ref 30.0–36.0)
MCV: 82.6 fL (ref 78.0–100.0)
Platelets: 202 10*3/uL (ref 150–400)
RBC: 4.47 MIL/uL (ref 4.22–5.81)
RDW: 16 % — AB (ref 11.5–15.5)
WBC: 8.5 10*3/uL (ref 4.0–10.5)

## 2013-08-08 NOTE — Progress Notes (Signed)
Physician Daily Progress Note  Subjective: Tolerating full clear diet as indicated by speech No abd pain BMs normalized  Urine now clear  No N/V   Objective: Vital signs in last 24 hours: Temp:  [97.3 F (36.3 C)-98.3 F (36.8 C)] 97.5 F (36.4 C) (03/31 0545) Pulse Rate:  [62-70] 69 (03/31 0545) Resp:  [16-18] 18 (03/31 0545) BP: (112-137)/(64-77) 117/64 mmHg (03/31 0545) SpO2:  [97 %-99 %] 98 % (03/31 0545) Weight change:  Last BM Date: 08/04/13  CBG (last 3)  No results found for this basename: GLUCAP,  in the last 72 hours  Intake/Output from previous day:  Intake/Output Summary (Last 24 hours) at 08/08/13 0754 Last data filed at 08/08/13 0600  Gross per 24 hour  Intake   2930 ml  Output   1000 ml  Net   1930 ml   03/30 0701 - 03/31 0700 In: 2930 [P.O.:180; I.V.:2750] Out: 1000 [Urine:625; Drains:375]  Physical Exam Gen: WM in NAD  HEENT:anicteric  Lungs:CTAB, no wheezes, rales  Cardio: RRR, no MRG  Abd: Soft, not TTP ,  no masses , perc drain in RUQ w/ light brown drainage  MSK: weakness of LE B/L  Neuro: no focal deficits  Skin:warm, dry     Lab Results:  Recent Labs  08/07/13 0440 08/08/13 0452  NA 139 143  K 3.8 3.7  CL 111 116*  CO2 18* 19  GLUCOSE 94 110*  BUN 18 12  CREATININE 0.82 0.72  CALCIUM 8.0* 7.7*     Recent Labs  08/06/13 1104  AST 21  ALT 23  ALKPHOS 35*  BILITOT 0.5  PROT 4.9*  ALBUMIN 2.1*     Recent Labs  08/07/13 0440 08/08/13 0452  WBC 8.9 8.5  HGB 11.9* 11.9*  HCT 37.5* 36.9*  MCV 82.8 82.6  PLT 216 202    Lab Results  Component Value Date   INR 1.30 08/05/2013   INR 0.95 07/29/2013   INR 0.93 12/09/2012     Recent Labs  08/06/13 1104  CKTOTAL 54    No results found for this basename: TSH, T4TOTAL, FREET3, T3FREE, THYROIDAB,  in the last 72 hours  No results found for this basename: VITAMINB12, FOLATE, FERRITIN, TIBC, IRON, RETICCTPCT,  in the last 72 hours  Micro Results: Recent  Results (from the past 240 hour(s))  CLOSTRIDIUM DIFFICILE BY PCR     Status: None   Collection Time    08/05/13  5:15 AM      Result Value Ref Range Status   C difficile by pcr NEGATIVE  NEGATIVE Final   Comment: Performed at [facility]  ANAEROBIC CULTURE     Status: None   Collection Time    08/05/13  2:32 PM      Result Value Ref Range Status   Specimen Description FLUID BILE   Final   Special Requests BILE ASPIRATED FROM GALLBLADDER   Final   Gram Stain     Final   Value: NO WBC SEEN     NO ORGANISMS SEEN     Performed at [facility]   Culture     Final   Value: NO ANAEROBES ISOLATED; CULTURE IN PROGRESS FOR 5 DAYS     Performed at [facility]   Report Status PENDING   Incomplete  BODY FLUID CULTURE     Status: None   Collection Time    08/05/13  2:32 PM      Result Value Ref Range  Status   Specimen Description FLUID BILE   Final   Special Requests BILE ASPIRATED FROM GALLBLADDER   Final   Gram Stain     Final   Value: NO WBC SEEN     NO ORGANISMS SEEN     Performed at [facility]   Culture     Final   Value: NO GROWTH 2 DAYS     Performed at [facility]   Report Status PENDING   Incomplete    Studies/Results: Dg Chest Port 1 View  08/06/2013   CLINICAL DATA:  Wheezing.  Questionable aspiration.  EXAM: PORTABLE CHEST - 1 VIEW  COMPARISON:  08/04/2013  FINDINGS: There is new right lung base opacity. This partly obscures the right hemidiaphragm. This may reflect aspiration pneumonitis. It may all be atelectasis. There is mild left medial lung base atelectasis.  Cardiac silhouette is normal in size. Moderate to large hiatal hernia. No other mediastinal masses.  No pulmonary edema.  No pneumothorax.  IMPRESSION: 1. New right lung base opacity. This may all be atelectasis, but is new from the prior study. This could reflect that aspiration pneumonitis.   Electronically Signed   By: Amie Portlandavid  Ormond M.D.   On: 08/06/2013 19:33      Medications: Scheduled: . aspirin  325 mg Oral Daily  . baclofen  20 mg Oral Daily  . ciprofloxacin  500 mg Oral BID  . cyanocobalamin  500 mcg Oral Daily  . Linaclotide  145 mcg Oral Daily  . lip balm  1 application Topical BID  . metroNIDAZOLE  500 mg Oral 3 times per day  . nebivolol  10 mg Oral Daily  . niacin  500 mg Oral QHS  . saccharomyces boulardii  250 mg Oral BID  . simvastatin  20 mg Oral q1800   Continuous: . sodium chloride 125 mL/hr at 08/07/13 0200    Assessment/Plan:  Sepsis - resolved.    Cholecystitis - on cipro/flagyl PO. Stopping IVF today. S/p perc drain 3/29. Tolerating clears  . IR recommends  daily flush of catheter with 5cc NS after discharge  Dysphagia - clear diet w/ asp precautions per speech  Diarrhea - c diff neg . Resolved   Dark urine - resolved   Dispo - to SNF tomorrow to complete PO abx and rehab w/ goal transition to home. I recd blumenthal to wife. FL2 signed on chart   DVT Prophylaxis - SCDs    LOS: 4 days   [name] 08/08/2013, 7:54 AM
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
## 2013-08-08 NOTE — Discharge Summary (Signed)
Physician Discharge Summary    PRINTICE HELLMER  MR#: [id number]  DOB:04-Oct-1935  Date of Admission: 08/04/2013 Date of Discharge: 08/09/2013  Attending Physician:Rodger Giangregorio  Patient's QMV:HQIONGE,XBMWUXL A, MD  Consults:Treatment Team:  Md Ccs, MD IR   Discharge Diagnoses: Principal Problem:   Cholecystitis   Discharge Medications:   Medication List         ascorbic acid 1000 MG tablet  Commonly known as:  VITAMIN C  Take 1,000 mg by mouth daily.     aspirin 325 MG tablet  Take 325 mg by mouth daily.     baclofen 10 MG tablet  Commonly known as:  LIORESAL  Take 1 tablet (10 mg total) by mouth at bedtime.     BYSTOLIC 10 MG tablet  Generic drug:  nebivolol  Take 10 mg by mouth daily.     CALCIUM + D PO  Take 1 tablet by mouth daily.     cholecalciferol 1000 UNITS tablet  Commonly known as:  VITAMIN D  Take 1,000 Units by mouth daily.     ciprofloxacin 500 MG tablet  Commonly known as:  CIPRO  Take 1 tab BID with last dose on evening of 4/5     clonazePAM 1 MG tablet  Commonly known as:  KLONOPIN  Take 0.5-1 mg by mouth at bedtime as needed for anxiety.     clonazePAM 0.5 MG tablet  Commonly known as:  KLONOPIN  Take 1 tablet (0.5 mg total) by mouth 2 (two) times daily as needed (anxiety).     cyanocobalamin 500 MCG tablet  Take 500 mcg by mouth daily.     diclofenac sodium 1 % Gel  Commonly known as:  VOLTAREN  Apply 2 g topically 4 (four) times daily as needed (Pain).     EPIPEN 0.3 mg/0.3 mL Devi  Generic drug:  EPINEPHrine  Inject 0.3 mg into the muscle once. As needed for anaphylaxis (unknown cause)     GENTEAL 0.3 % Soln  Generic drug:  Hypromellose  Place 1 drop into both eyes 3 (three) times daily as needed (dry eyes).     HYDROcodone-acetaminophen 5-325 MG per tablet  Commonly known as:  NORCO/VICODIN  Take 1-2 tablets by mouth every 4 (four) hours as needed for moderate pain.     lidocaine 5 %  Commonly known as:  LIDODERM  Place  1 patch onto the skin daily as needed (Back pain). Remove & Discard patch within 12 hours or as directed by MD     Linaclotide 145 MCG Caps capsule  Commonly known as:  LINZESS  Take 1 capsule (145 mcg total) by mouth daily.     meclizine 25 MG tablet  Commonly known as:  ANTIVERT  Take 25 mg by mouth 3 (three) times daily as needed for dizziness.     metroNIDAZOLE 500 MG tablet  Commonly known as:  FLAGYL  Take 1 tab q8hours with last dose on evening of 4/5     multivitamin with minerals Tabs tablet  Take 1 tablet by mouth daily.     ondansetron 4 MG tablet  Commonly known as:  ZOFRAN  Take 1 tablet (4 mg total) by mouth every 6 (six) hours.     saccharomyces boulardii 250 MG capsule  Commonly known as:  FLORASTOR  Take 1 capsule (250 mg total) by mouth 2 (two) times daily.        Hospital Procedures: Ct Abdomen Pelvis Wo Contrast  08/03/2013   CLINICAL DATA:  General abdominal pain, nausea and vomiting. Elevated lipase.  EXAM: CT ABDOMEN AND PELVIS WITHOUT CONTRAST  TECHNIQUE: Multidetector CT imaging of the abdomen and pelvis was performed following the standard protocol without intravenous contrast.  COMPARISON:  CT of the abdomen and pelvis 05/30/2005.  FINDINGS: Lung Bases: Massive hiatal hernia with, significantly increased in size compared to the prior study. The majority of the stomach is now intrathoracic.  Abdomen/Pelvis: Henslee locule of gas within the nondependent portion of the gallbladder, likely gas within a non calcified gallstone. Gallbladder is moderately distended. No abnormal gallbladder wall thickening or pericholecystic fluid to suggest acute cholecystitis at this time. The unenhanced appearance of the liver, pancreas, spleen, bilateral adrenal glands and the right kidney is unremarkable. Numerous low-attenuation lesions in the left renal pelvis are most compatible with parapelvic cysts, but are incompletely evaluated on today's non contrast CT examination. No  abnormal calcifications within the collecting system of either kidney, along the course of either ureter, or within the lumen of the urinary bladder.  No significant volume of ascites. No pneumoperitoneum. No pathologic distention of Russum bowel. No definite lymphadenopathy identified within the abdomen or pelvis on today's non contrast CT examination. Atherosclerosis throughout the abdominal and pelvic vasculature, without evidence of aneurysm. Prostate gland remains enlarged, measuring up to 4.9 x 5.7 cm on today's examination. Urinary bladder is unremarkable in appearance. Multiple surgical clips along the posterior aspect of the pelvic side wall.  Musculoskeletal: Lucent lesion in the intertrochanteric region of the right femur, similar to remote prior study, presumably benign. There are no aggressive appearing lytic or blastic lesions noted in the visualized portions of the skeleton.  IMPRESSION: 1. No acute findings in the abdomen or pelvis to account for the patient's symptoms. 2. Marked interval enlargement of a massive hiatal hernia. The stomach is essentially nearly completely intrathoracic on today's examination. 3. Lanius locule of gas non dependently within the lumen of the gallbladder is presumably a Palermo amount of gas within a non calcified gallstone. No current findings to suggest acute cholecystitis at this time. 4. Atherosclerosis. 5. Additional incidental findings, as above.   Electronically Signed   By: Trudie Reed M.D.   On: 08/03/2013 05:12   Ct Head Wo Contrast  08/04/2013   CLINICAL DATA:  Slurred speech, dysarthria  EXAM: CT HEAD WITHOUT CONTRAST  TECHNIQUE: Contiguous axial images were obtained from the base of the skull through the vertex without intravenous contrast.  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 Chronic ischemia in the white matter left greater than right.  Ventricular enlargement secondary to  atrophy. Negative for intracranial hemorrhage or mass.  No acute bony abnormality.  IMPRESSION: Atrophy and chronic ischemia.  No acute abnormality.   Electronically Signed   By: Marlan Palau M.D.   On: 08/04/2013 13:52   Ct Head (brain) Wo Contrast  07/29/2013   CLINICAL DATA:  Generalized weakness, slurred speech, hypertension  EXAM: CT HEAD WITHOUT CONTRAST  TECHNIQUE: Contiguous axial images were obtained from the base of the skull through the vertex without intravenous contrast.  COMPARISON:  MRI brain dated 12/09/2012  FINDINGS: No evidence of parenchymal hemorrhage or extra-axial fluid collection. No mass lesion, mass effect, or midline shift.  No CT evidence of acute infarction. 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  Subcortical white matter and periventricular Regas vessel ischemic changes. Intracranial atherosclerosis.  Global cortical atrophy with secondary ventricular prominence.  The visualized paranasal sinuses are essentially clear.  The mastoid air cells are unopacified.  No evidence of calvarial fracture.  IMPRESSION: No evidence of acute intracranial abnormality.  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  Atrophy with Lattanzio vessel ischemic changes and intracranial atherosclerosis.   Electronically Signed   By: Charline Bills M.D.   On: 07/29/2013 17:35   Ct Abdomen Pelvis W Contrast  08/04/2013   CLINICAL DATA:  Abdominal pain, epigastric and right side pain, vomiting, history right-side hemiplegia, hypertension, hiatal hernia, cholelithiasis  EXAM: CT ABDOMEN AND PELVIS WITH CONTRAST  TECHNIQUE: Multidetector CT imaging of the abdomen and pelvis was performed using the standard protocol following bolus administration of intravenous contrast. Sagittal and coronal MPR images reconstructed from axial data set.  CONTRAST:  OMNIPAQUE IOHEXOL 300 MG/ML SOLN IV. No oral contrast.  COMPARISON:  08/03/2013 abdominal CT and right upper quadrant ultrasound  FINDINGS: Stavros right pleural effusion and right  lower lobe atelectasis.  Large hiatal hernia.  Significantly distended gallbladder containing a dependent calcified gallstone.  Gallbladder wall thickening with moderate pericholecystic edema, progressive.  Brockwell amount of perihepatic free fluid, new.  Increased edema adjacent to duodenum, pancreatic head, colon, stomach, and in porta hepatis.  Findings are most consistent with acute cholecystitis progressive since previous study.  Liver, spleen, pancreas, and adrenal glands otherwise unremarkable.  Symmetric nephrograms with bilateral peripelvic renal cysts.  Stomach and bowel loops otherwise unremarkable.  Dilated inguinal canals containing fat question hernia.  Minimal prostatic enlargement.  Unremarkable appendix, bladder, and ureters.  Minimal pelvic free fluid.  No mass, adenopathy, or free air.  Tiny focus of gas seen within gallbladder on previous exam no longer identified.  Scattered degenerative disc disease changes lumbar spine.  IMPRESSION: Distended gallbladder with cholelithiasis, gallbladder wall thickening and increased  pericholecystic/right upper quadrant edema since previous study consistent with acute cholecystitis.  Melecio amount of free intraperitoneal fluid now identified.  Large hiatal hernia.  Bilateral peripelvic renal cysts.  Duhon right pleural effusion and increased right lower lobe atelectasis.   Electronically Signed   By: Ulyses Southward M.D.   On: 08/04/2013 09:40   Ir Perc Cholecystostomy  08/05/2013   CLINICAL DATA:  Cholelithiasis, acute cholecystitis and sepsis. The patient is not a candidate for cholecystectomy currently and requires placement of a percutaneous cholecystostomy tube.  EXAM: PERCUTANEOUS CHOECYSTOSTOMY  COMPARISON:  CT ABD/PELVIS W CM dated 08/04/2013; CT ABD/PELV WO CM dated 08/03/2013; US ABDOMEN LIMITED RUQ/ASCITES dated 08/03/2013  ANESTHESIA/SEDATION: No conscious sedation was administered.  CONTRAST:  5 mL Omnipaque 300  MEDICATIONS: No additional medications.   FLUOROSCOPY TIME:  12 seconds.  PROCEDURE: The procedure, risks, benefits, and alternatives were explained to the patient. Questions regarding the procedure were encouraged and answered. The patient understands and consents to the procedure.  The right abdominal wall was prepped with Betadine in a sterile fashion, and a sterile drape was applied covering the operative field. A sterile gown and sterile gloves were used for the procedure. Local anesthesia was provided with 1% Lidocaine. Ultrasound image documentation was performed. Fluoroscopy during the procedure and fluoro spot radiograph confirms appropriate catheter position.  Ultrasound was utilized to localize the gallbladder. Under direct ultrasound guidance, an 18 gauge needle was advanced via a transhepatic approach into the gallbladder lumen. Aspiration was performed and a bile sample sent for culture studies. A Rheaume amount of diluted contrast material was injected. A guide wire was then advanced into the gallbladder. A transitional dilator was placed.  Percutaneous tract dilatation was then performed over a guide wire  to 10-French. A 10-French pigtail drainage catheter was then advanced into the gallbladder lumen under fluoroscopy. Catheter was formed and injected with contrast material to confirm position. The catheter was flushed and connected to a gravity drainage bag. It was secured at the skin with a Prolene retention suture and Stat-Lock device.  COMPLICATIONS: None  FINDINGS: After needle puncture of the gallbladder, a bile sample was aspirated and sent for culture. The cholecystostomy tube was advanced into the gallbladder lumen and formed. It is now draining bile. This tube will be left to gravity drainage.  IMPRESSION: Percutaneous cholecystostomy with placement of 10-French drainage catheter into the gallbladder lumen. This was left to gravity drainage.   Electronically Signed   By: Irish Lack M.D.   On: 08/05/2013 15:10   Dg Chest Port 1  View  08/06/2013   CLINICAL DATA:  Wheezing.  Questionable aspiration.  EXAM: PORTABLE CHEST - 1 VIEW  COMPARISON:  08/04/2013  FINDINGS: There is new right lung base opacity. This partly obscures the right hemidiaphragm. This may reflect aspiration pneumonitis. It may all be atelectasis. There is mild left medial lung base atelectasis.  Cardiac silhouette is normal in size. Moderate to large hiatal hernia. No other mediastinal masses.  No pulmonary edema.  No pneumothorax.  IMPRESSION: 1. New right lung base opacity. This may all be atelectasis, but is new from the prior study. This could reflect that aspiration pneumonitis.   Electronically Signed   By: Amie Portland M.D.   On: 08/06/2013 19:33   Dg Chest Port 1 View  08/04/2013   CLINICAL DATA:  Cough and congestion.  Mid to upper abdominal pain.  EXAM: PORTABLE CHEST - 1 VIEW  COMPARISON:  Chest x-ray 02/15/2010.  FINDINGS: Lung volumes are low. No consolidative airspace disease. No pleural effusions. No evidence of pulmonary edema. Large hiatal hernia. Heart size is within normal limits. The patient is rotated to the right on today's exam, resulting in distortion of the mediastinal contours and reduced diagnostic sensitivity and specificity for mediastinal pathology.  IMPRESSION: 1. No radiographic evidence of acute cardiopulmonary disease.   Electronically Signed   By: Trudie Reed M.D.   On: 08/04/2013 06:33   US Abdomen Limited Ruq  08/03/2013   CLINICAL DATA:  Right-sided abdominal pain  EXAM: US ABDOMEN LIMITED - RIGHT UPPER QUADRANT  COMPARISON:  None.  FINDINGS: Gallbladder:  Well distended with multiple Pineiro stones and gallbladder sludge within. A negative sonographic Eulah Pont sign is noted. Edematous changes in the gallbladder wall are noted. The wall is thickened a 6 mm.  Common bile duct:  Diameter: 7 mm although within normal limits for the patient's given age.  Liver:  No focal lesion identified. Within normal limits in parenchymal  echogenicity.  IMPRESSION: Well distended gallbladder with evidence of gallstones and sludge within. Gallbladder wall thickening and edema is noted. These changes may still be related tracheal cholecystitis although negative sonographic Eulah Pont sign is noted.   Electronically Signed   By: Alcide Clever M.D.   On: 08/03/2013 07:49    History of Present Illness: MEZIAH BLASINGAME is an 78 y.o. male. : Patient presented yesterday w/ abd pain and elevated lipase, found to have gall stones. He was sent home w/ a [facility] follow up arranged b/c no signs of acute choleycystitis appreciated. Today he presents back w/ continued severe RUQ abd pain. He now has leukocytosis, lactic acidosis (mild), and CT A/P verifying progressive cholecystitis. Gen Surg has been consulted and will follow for  treatment. I have been asked to admit the patient. Vitals are currently stable. He will be placed on IVF, cipro, flagyl for  choleycystitis   He also complains of some dysarthria and facial droop. THis is a chronic issue but wife seems to think possibly worse in past few weeks. CT head was NAICA 7 days ago. MRI head was w/o acute findings about 6 months ago. In record review, this seems to be chronic and was being worked up last year as well by [facility]. The main culprit seems to be spinal stenosis w/o addition peripheral neuropathy per a nerve conduction study performed last year.      Hospital Course: Acute cholecystitis - on cipro/flagyl PO starting on 3/27, and will need to stay on these abx for a total of 10 days. Last day of abx should be 4/5. IR placed perc drain 3/29.  IR recommends daily flush of biliary catheter with 5cc NS while catheter in place. Will need to have f/u w/ Dr Carman Ching in 3 weeks. May be candidate for elective lap choley in the future if improves. Do not remove drain until instructed by surgery   Dysphagia - speech saw patient and rec'd Dysphagia 3 (mechanical soft);Thin liquid. Please have speech reeval status  of swallowing in 3-5 days   Dysarthria - CT head was NAICA  Malnutrition - Ensure Complete BID   Deconditioning - placing in [facility] for rehab   Day of Discharge Exam BP 127/81  Pulse 64  Temp(Src) 97.6 F (36.4 C) (Oral)  Resp 18  Ht 6\' 2"  (1.88 m)  Wt 84.6 kg (186 lb 8.2 oz)  BMI 23.94 kg/m2  SpO2 93%  Physical Exam: Gen: WM in NAD  HEENT:anicteric  Lungs:CTAB, no wheezes, rales  Cardio: RRR, no MRG  Abd: Soft, not TTP , no masses , perc drain in RUQ w/ light brown drainage  MSK: weakness of LE B/L  Neuro: no focal deficits  Skin:warm, dry    Discharge Labs:  Recent Labs  08/07/13 0440 08/08/13 0452  NA 139 143  K 3.8 3.7  CL 111 116*  CO2 18* 19  GLUCOSE 94 110*  BUN 18 12  CREATININE 0.82 0.72  CALCIUM 8.0* 7.7*    Recent Labs  08/06/13 1104  AST 21  ALT 23  ALKPHOS 35*  BILITOT 0.5  PROT 4.9*  ALBUMIN 2.1*    Recent Labs  08/08/13 0452 08/09/13 0620  WBC 8.5 8.7  HGB 11.9* 12.1*  HCT 36.9* 36.1*  MCV 82.6 82.0  PLT 202 207   Lab Results  Component Value Date   INR 1.30 08/05/2013   INR 0.95 07/29/2013   INR 0.93 12/09/2012    Recent Labs  08/06/13 1104  CKTOTAL 54   No results found for this basename: TSH, T4TOTAL, FREET3, T3FREE, THYROIDAB,  in the last 72 hours No results found for this basename: VITAMINB12, FOLATE, FERRITIN, TIBC, IRON, RETICCTPCT,  in the last 72 hours  Discharge instructions: Discharge Orders   Future Appointments Provider Department Dept Phone   08/10/2013 10:20 AM Wilmon Arms. Corliss Skains, MD [facility], [place] [phone]   10/18/2013 3:00 PM Melvyn Novas, MD [name] [phone]   Future Orders Complete By Expires   Diet - low sodium heart healthy  As directed    Increase activity slowly  As directed      01-Home or Self Care   Disposition: SNF   Follow-up Appts: Follow-up [facility] Jacky Kindle [facility]  Call for  appointment.  Condition on Discharge: stable   Tests Needing Follow-up: none   Time spent in discharge (includes decision making & examination of pt): 45 minutes    Signed: Krystan Northrop 08/09/2013, 6:56 AM

## 2013-08-08 NOTE — Progress Notes (Signed)
Gen. Surgery:  I have interviewed and examined this patient this afternoon. I discussed his inpatient and subsequent outpatient treatment plan with him and his wife. I agree with the assessment and treatment plan outlined by Ms. Dort, PA  His abdomen is soft and he is draining clear bile from his tube. He is now afebrile.  Day #3 antibiotics I think that his infection has come under excellent control, as manifested by resolution of his sepsis, fevers and leukocytosis.  Plan: Probable discharge to [facility] tomorrow Recommend that he continue Cipro and Flagyl orally  for a total of 10 days. Antibiotic should therefore  be discontinued on April 7. Continue percutaneous tube care as outlined by interventional radiology Followup with Dr. Abigail Miyamotoouglas Blackman in 3 weeks. Hopefully his medical condition will stabilize and improve and he may become a candidate for elective laparoscopic cholecystectomy in 6-8 weeks.   Angelia MouldHaywood M. Derrell LollingIngram, M.D., [facility] General and Minimally invasive Surgery Breast and Colorectal Surgery Office:   [phone] Pager:   [phone]
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
## 2013-08-08 NOTE — Progress Notes (Signed)
Clinical Social Work  CSW spoke with [facility] who [facility] CSW spoke with SNF who is agreeable to admission if patient is medically stable tomorrow and authorization is received. [facility] and spoke with Deanna Art[place]Keisha at [facility] [phone]) in order to inform her which facility patient will DC to and [employer] requested updated MD and PT notes. [facility] and representative reported she will call CSW back with authorization.   CSW will continue to follow.  SteubenHolly Unice V[place], [place]CS[place] [phone]
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
## 2013-08-08 NOTE — Progress Notes (Signed)
Clinical Social Work  TEFL teacherBCBS authorization received # [id number]. [facility] at 1pm. Wife [facility] because she has an Marine scientistoutstanding bill with [employer]. [facility] as long as wife pays for transportation up front. CSW shared this information with wife and provided [facility] number for her to make arrangements for payment. CSW gave wife directions to facility and will continue to follow in order to assist with transfer once medically stable.  FallstonHolly Jeri Rawlins, [place] [phone]

## 2013-08-09 LAB — CBC
HCT: 36.1 % — ABNORMAL LOW (ref 39.0–52.0)
HEMOGLOBIN: 12.1 g/dL — AB (ref 13.0–17.0)
MCH: 27.5 pg (ref 26.0–34.0)
MCHC: 33.5 g/dL (ref 30.0–36.0)
MCV: 82 fL (ref 78.0–100.0)
PLATELETS: 207 10*3/uL (ref 150–400)
RBC: 4.4 MIL/uL (ref 4.22–5.81)
RDW: 15.7 % — ABNORMAL HIGH (ref 11.5–15.5)
WBC: 8.7 10*3/uL (ref 4.0–10.5)

## 2013-08-09 LAB — BASIC METABOLIC PANEL
BUN: 8 mg/dL (ref 6–23)
CO2: 20 mEq/L (ref 19–32)
Calcium: 8 mg/dL — ABNORMAL LOW (ref 8.4–10.5)
Chloride: 110 mEq/L (ref 96–112)
Creatinine, Ser: 0.73 mg/dL (ref 0.50–1.35)
GFR, EST NON AFRICAN AMERICAN: 88 mL/min — AB (ref >90)
Glucose, Bld: 115 mg/dL — ABNORMAL HIGH (ref 70–99)
POTASSIUM: 3.4 meq/L — AB (ref 3.7–5.3)
Sodium: 142 mEq/L (ref 137–147)

## 2013-08-09 MED ORDER — CIPROFLOXACIN HCL 500 MG PO TABS: ORAL_TABLET | ORAL | Status: DC

## 2013-08-09 MED ORDER — METRONIDAZOLE 500 MG PO TABS: ORAL_TABLET | ORAL | Status: DC

## 2013-08-09 MED ORDER — HYDROCODONE-ACETAMINOPHEN 5-325 MG PO TABS: 1.0000 | ORAL_TABLET | ORAL | Status: DC | PRN

## 2013-08-09 MED ORDER — BACLOFEN 20 MG PO TABS: 20.0000 mg | ORAL_TABLET | Freq: Every day | ORAL | Status: DC

## 2013-08-09 MED ORDER — BACLOFEN 10 MG PO TABS: 10.0000 mg | ORAL_TABLET | Freq: Every day | ORAL | Status: DC

## 2013-08-09 MED ORDER — CLONAZEPAM 0.5 MG PO TABS: 0.5000 mg | ORAL_TABLET | Freq: Two times a day (BID) | ORAL | Status: DC | PRN

## 2013-08-09 MED ORDER — LINACLOTIDE 145 MCG PO CAPS: 145.0000 ug | ORAL_CAPSULE | Freq: Every day | ORAL | Status: DC

## 2013-08-09 MED ORDER — SACCHAROMYCES BOULARDII 250 MG PO CAPS: 250.0000 mg | ORAL_CAPSULE | Freq: Two times a day (BID) | ORAL | Status: DC

## 2013-08-09 NOTE — Progress Notes (Signed)
Patient was stable at discharge. Patient was accompanied by nursing staff to lobby for transport. Patient assessment has not changed from am.

## 2013-08-09 NOTE — Progress Notes (Addendum)
Clinical Social Work  [facility] after 1:30pm today. CSW prepared DC packet with FL2 and hard scripts included. Wife has packet and will take to SNF with her at 1 pm when she goes to complete paperwork. Wife has paid for [facility] to transport patient to SNF. SNF aware and agreeable to plans. RN to call report to SNF. [facility]W coordinated [facility] and RN will transport patient to main entrance at 1:30pm.  CSW is signing off but available if needed.  CaseyHolly Auriel Kist, [place] [phone]

## 2013-08-09 NOTE — Discharge Instructions (Signed)
Cholecystitis Cholecystitis is an inflammation of your gallbladder. It is usually caused by a buildup of gallstones or sludge (cholelithiasis) in your gallbladder. The gallbladder stores a fluid that helps digest fats (bile). Cholecystitis is serious and needs treatment right away.  CAUSES   Gallstones. Gallstones can block the tube that leads to your gallbladder, causing bile to build up. As bile builds up, the gallbladder becomes inflamed.  Bile duct problems, such as blockage from scarring or kinking.  Tumors. Tumors can stop bile from leaving your gallbladder correctly, causing bile to build up. As bile builds up, the gallbladder becomes inflamed. SYMPTOMS   Nausea.  Vomiting.  Abdominal pain, especially in the upper right area of your abdomen.  Abdominal tenderness or bloating.  Sweating.  Chills.  Fever.  Yellowing of the skin and the whites of the eyes (jaundice). DIAGNOSIS  Your caregiver may order blood tests to look for infection or gallbladder problems. Your caregiver may also order imaging tests, such as an ultrasound or computed tomography (CT) scan. Further tests may include a hepatobiliary iminodiacetic acid (HIDA) scan. This scan allows your caregiver to see your bile move from the liver to the gallbladder and to the [place] intestine. TREATMENT  A hospital stay is usually necessary to lessen the inflammation of your gallbladder. You may be required to not eat or drink (fast) for a certain amount of time. You may be given medicine to treat pain or an antibiotic medicine to treat an infection. Surgery may be needed to remove your gallbladder (cholecystectomy) once the inflammation has gone down. Surgery may be needed right away if you develop complications such as death of gallbladder tissue (gangrene) or a tear (perforation) of the gallbladder.  HOME CARE INSTRUCTIONS  Home care will depend on your treatment. In general:  If you were given antibiotics, take them as  directed. Finish them even if you start to feel better.  Only take over-the-counter or prescription medicines for pain, discomfort, or fever as directed by your caregiver.  Follow a low-fat diet until you see your caregiver again.  Keep all follow-up visits as directed by your caregiver. SEEK IMMEDIATE MEDICAL CARE IF:   Your pain is increasing and not controlled by medicines.  Your pain moves to another part of your abdomen or to your back.  You have a fever.  You have nausea and vomiting. MAKE SURE YOU:  Understand these instructions.  Will watch your condition.  Will get help right away if you are not doing well or get worse. Document Released: 04/27/2005 Document Revised: 07/20/2011 Document Reviewed: 03/13/2011 ExitCare Patient Information 2014 [employer].  
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
## 2013-08-09 NOTE — Progress Notes (Signed)
I agree with the assessment, treatment plan, and followup arrangements outlined by Ms. Dort, PA   BranchvilleHaywood M. Derrell LollingIngram, M.D., [facility] General and Minimally invasive Surgery Breast and Colorectal Surgery Office:   [phone] Pager:   [phone]

## 2013-08-10 ENCOUNTER — Ambulatory Visit (INDEPENDENT_AMBULATORY_CARE_PROVIDER_SITE_OTHER): Payer: Medicare Other | Admitting: Surgery

## 2013-08-10 LAB — ANAEROBIC CULTURE: Gram Stain: NONE SEEN

## 2013-08-11 NOTE — Telephone Encounter (Signed)
Closing encounter

## 2013-09-05 ENCOUNTER — Encounter (INDEPENDENT_AMBULATORY_CARE_PROVIDER_SITE_OTHER): Payer: Self-pay | Admitting: Surgery

## 2013-09-05 ENCOUNTER — Ambulatory Visit (INDEPENDENT_AMBULATORY_CARE_PROVIDER_SITE_OTHER): Payer: Medicare Other | Admitting: Surgery

## 2013-09-05 VITALS — BP 114/80 | HR 65 | Temp 97.5°F | Resp 14 | Ht 72.0 in | Wt 183.0 lb

## 2013-09-05 DIAGNOSIS — K8000 Calculus of gallbladder with acute cholecystitis without obstruction: Secondary | ICD-10-CM

## 2013-09-05 NOTE — Progress Notes (Signed)
Subjective:     Patient ID: [name], male   DOB: 01/04/1936, 78 y.o.   MRN: [id number]  HPI He is here for a followup from a hospitalization for acute cholecystitis. Because of his overall medical condition at that time, he had a percutaneous cholecystostomy tube placed. He is currently recovering a rehabilitation facility. He is doing much better and is eager to proceed with a laparoscopic cholecystectomy. He has minimal abdominal discomfort and overall clinically has been improving  Review of Systems     Objective:   Physical Exam On exam, he he is wheelchair bound but is alert His lungs are clear His abdomen is soft. The drain site is clean. There is bile drainage and the drain    Assessment:     Cholecystitis status post percutaneous cholecystostomy     Plan:     We will now schedule him for a laparoscopic cholecystectomy.  I discussed the risk of the surgery with the patient and his family member at length. They are eager to proceed

## 2013-09-06 ENCOUNTER — Encounter (HOSPITAL_COMMUNITY): Payer: Self-pay | Admitting: *Deleted

## 2013-09-06 ENCOUNTER — Encounter (HOSPITAL_COMMUNITY): Payer: Self-pay | Admitting: Pharmacy Technician

## 2013-09-06 MED ORDER — CIPROFLOXACIN IN D5W 400 MG/200ML IV SOLN
400.0000 mg | INTRAVENOUS | Status: AC
  Administered 2013-09-07: 400 mg via INTRAVENOUS
  Filled 2013-09-06: qty 200

## 2013-09-06 NOTE — H&P (Signed)
[name] is an 78 y.o. male.   Chief Complaint: Chronic cholecystitis HPI: This gentleman is status post percutaneous cholecystostomy him in 6 weeks ago for acute cholecystitis. At the time of admission, he was deemed not to be an operative candidate at the moment so percutaneous cholecystostomy was performed. He has since been discharged and is in rehabilitation facility. He has improved significantly. He still has right upper quadrant abdominal pain. His drain remained in place  Past Medical History  Diagnosis Date  . Hemiplegia   . Vertigo   . Hypertension   . Restless leg syndrome   . TBI (traumatic brain injury)     WITH LEFT HEMISPHERIC BLEED  . Depression with anxiety   . High cholesterol   . Chronic pain     lower back  . Dementia   . Tremor     familiar  . Swelling of upper lip     tongue  . Weakness of both legs   . Weakness generalized 12/22/2012  . Dementia with behavioral disturbance 04/18/2013  . Cholecystitis     Past Surgical History  Procedure Laterality Date  . Back surgery    . Total knee arthroplasty    . Hernia repair  1985  . Laminectomy  2006  . Colon resection  1996  . Dental surgery  3/14    all upper teeth removed  . Esophagus stretched      Family History  Problem Relation Age of Onset  . Tremor Maternal Grandfather    Social History:  reports that he has never smoked. He has never used smokeless tobacco. He reports that he does not drink alcohol or use illicit drugs.  Allergies:  Allergies  Allergen Reactions  . Ace Inhibitors Swelling  . Penicillins Swelling    No prescriptions prior to admission    No results found for this or any previous visit (from the past 48 hour(s)). No results found.  Review of Systems  All other systems reviewed and are negative.   There were no vitals taken for this visit. Physical Exam  Constitutional: He is oriented to person, place, and time.  In a wheelchair, awake and alert and in no  distress  HENT:  Head: Normocephalic and atraumatic.  Right Ear: External ear normal.  Left Ear: External ear normal.  Nose: Nose normal.  Mouth/Throat: Oropharynx is clear and moist. No oropharyngeal exudate.  Eyes: Conjunctivae are normal. Pupils are equal, round, and reactive to light.  Neck: Normal range of motion. No tracheal deviation present.  Cardiovascular: Normal rate, regular rhythm and normal heart sounds.   Respiratory: Effort normal and breath sounds normal.  GI: Soft. Bowel sounds are normal. There is no tenderness. There is no rebound.  Percutaneous drain in the right upper quadrant  Musculoskeletal: Normal range of motion.  Neurological: He is alert and oriented to person, place, and time.  Skin: Skin is warm and dry. No erythema.  Psychiatric: His behavior is normal. Judgment normal.     Assessment/Plan Chronic cholecystitis  At this point he has improved significantly so the plan will be to proceed to the operating room for laparoscopic cholecystectomy and possible cholangiogram. I explained the risks in detail. These include but are not limited to bleeding, infection, injury to shredding structures, bile duct injury, bile leak, the need to convert to an open procedure, cardiopulmonary issues, DVT, etc. He understands and wishes to proceed. Surgery will be scheduled  Shelly RubensteinDouglas A Antionio Negron 09/06/2013, 1:32 PM

## 2013-09-06 NOTE — Progress Notes (Signed)
SDW pre-op call completed by pt nurse Shelby MattocksMary Fogg. Nurse was unable to complete Apnea Screening ( DOS). According to nurse,  pt last dose of Aspirin was administered on 09/05/13. Nurse made aware to stop administering Aspirin, NSAID's  (Voltaren), Multivitamins and any herbal medications. [facility]  [facility]abs, cxr  and EKG ( if any). Nurse also asked that [facility] fax # [phone]  " because pt spouse does not follow through and give D/C instructions to them."  [facility] phone # [phone].

## 2013-09-07 ENCOUNTER — Encounter (HOSPITAL_COMMUNITY): Payer: Medicare Other | Admitting: Anesthesiology

## 2013-09-07 ENCOUNTER — Encounter (HOSPITAL_COMMUNITY): Payer: Self-pay | Admitting: *Deleted

## 2013-09-07 ENCOUNTER — Encounter (HOSPITAL_COMMUNITY)
Admission: RE | Disposition: A | Discharge status: Skilled Nursing Facility | Payer: Self-pay | Source: Ambulatory Visit | Attending: Surgery

## 2013-09-07 ENCOUNTER — Ambulatory Visit (HOSPITAL_COMMUNITY): Payer: Medicare Other | Admitting: Anesthesiology

## 2013-09-07 ENCOUNTER — Observation Stay (HOSPITAL_COMMUNITY)
Admission: RE | Admit: 2013-09-07 | Discharge: 2013-09-08 | Disposition: A | Discharge status: Skilled Nursing Facility | Payer: Medicare Other | Source: Ambulatory Visit | Attending: Surgery | Admitting: Surgery

## 2013-09-07 DIAGNOSIS — M549 Dorsalgia, unspecified: Secondary | ICD-10-CM | POA: Insufficient documentation

## 2013-09-07 DIAGNOSIS — G250 Essential tremor: Secondary | ICD-10-CM | POA: Insufficient documentation

## 2013-09-07 DIAGNOSIS — F329 Major depressive disorder, single episode, unspecified: Secondary | ICD-10-CM | POA: Insufficient documentation

## 2013-09-07 DIAGNOSIS — I10 Essential (primary) hypertension: Secondary | ICD-10-CM | POA: Insufficient documentation

## 2013-09-07 DIAGNOSIS — F411 Generalized anxiety disorder: Secondary | ICD-10-CM | POA: Insufficient documentation

## 2013-09-07 DIAGNOSIS — Z87820 Personal history of traumatic brain injury: Secondary | ICD-10-CM | POA: Insufficient documentation

## 2013-09-07 DIAGNOSIS — K8010 Calculus of gallbladder with chronic cholecystitis without obstruction: Secondary | ICD-10-CM | POA: Insufficient documentation

## 2013-09-07 DIAGNOSIS — G252 Other specified forms of tremor: Secondary | ICD-10-CM

## 2013-09-07 DIAGNOSIS — G8929 Other chronic pain: Secondary | ICD-10-CM | POA: Insufficient documentation

## 2013-09-07 DIAGNOSIS — E7800 Pure hypercholesterolemia, unspecified: Secondary | ICD-10-CM | POA: Insufficient documentation

## 2013-09-07 DIAGNOSIS — F0390 Unspecified dementia without behavioral disturbance: Secondary | ICD-10-CM | POA: Insufficient documentation

## 2013-09-07 DIAGNOSIS — K8000 Calculus of gallbladder with acute cholecystitis without obstruction: Principal | ICD-10-CM | POA: Insufficient documentation

## 2013-09-07 DIAGNOSIS — K8066 Calculus of gallbladder and bile duct with acute and chronic cholecystitis without obstruction: Secondary | ICD-10-CM

## 2013-09-07 DIAGNOSIS — Z96659 Presence of unspecified artificial knee joint: Secondary | ICD-10-CM | POA: Insufficient documentation

## 2013-09-07 DIAGNOSIS — I69959 Hemiplegia and hemiparesis following unspecified cerebrovascular disease affecting unspecified side: Secondary | ICD-10-CM | POA: Insufficient documentation

## 2013-09-07 DIAGNOSIS — F3289 Other specified depressive episodes: Secondary | ICD-10-CM | POA: Insufficient documentation

## 2013-09-07 DIAGNOSIS — G2581 Restless legs syndrome: Secondary | ICD-10-CM | POA: Insufficient documentation

## 2013-09-07 HISTORY — PX: CHOLECYSTECTOMY: SHX55

## 2013-09-07 HISTORY — DX: Cholecystitis, unspecified: K81.9

## 2013-09-07 HISTORY — PX: LAPAROSCOPIC CHOLECYSTECTOMY: SUR755

## 2013-09-07 HISTORY — DX: Other abnormal involuntary movements: R25.8

## 2013-09-07 HISTORY — DX: Personal history of other diseases of the digestive system: Z87.19

## 2013-09-07 HISTORY — DX: Essential tremor: G25.0

## 2013-09-07 HISTORY — DX: Unspecified osteoarthritis, unspecified site: M19.90

## 2013-09-07 LAB — CBC
HCT: 42.2 % (ref 39.0–52.0)
Hemoglobin: 13.6 g/dL (ref 13.0–17.0)
MCH: 26.4 pg (ref 26.0–34.0)
MCHC: 32.2 g/dL (ref 30.0–36.0)
MCV: 81.8 fL (ref 78.0–100.0)
Platelets: 364 10*3/uL (ref 150–400)
RBC: 5.16 MIL/uL (ref 4.22–5.81)
RDW: 15.7 % — ABNORMAL HIGH (ref 11.5–15.5)
WBC: 10.9 10*3/uL — ABNORMAL HIGH (ref 4.0–10.5)

## 2013-09-07 LAB — BASIC METABOLIC PANEL
BUN: 14 mg/dL (ref 6–23)
CALCIUM: 9 mg/dL (ref 8.4–10.5)
CO2: 22 meq/L (ref 19–32)
Chloride: 100 mEq/L (ref 96–112)
Creatinine, Ser: 0.95 mg/dL (ref 0.50–1.35)
GFR calc non Af Amer: 79 mL/min — ABNORMAL LOW (ref >90)
Glucose, Bld: 97 mg/dL (ref 70–99)
Potassium: 4.2 mEq/L (ref 3.7–5.3)
SODIUM: 137 meq/L (ref 137–147)

## 2013-09-07 SURGERY — LAPAROSCOPIC CHOLECYSTECTOMY WITH INTRAOPERATIVE CHOLANGIOGRAM
Anesthesia: General | Site: Abdomen

## 2013-09-07 MED ORDER — METOCLOPRAMIDE HCL 5 MG/ML IJ SOLN
INTRAMUSCULAR | Status: AC
  Filled 2013-09-07: qty 2

## 2013-09-07 MED ORDER — GLYCOPYRROLATE 0.2 MG/ML IJ SOLN
INTRAMUSCULAR | Status: AC
  Filled 2013-09-07: qty 2

## 2013-09-07 MED ORDER — ONDANSETRON HCL 4 MG PO TABS
4.0000 mg | ORAL_TABLET | Freq: Four times a day (QID) | ORAL | Status: DC | PRN
  Administered 2013-09-08: 4 mg via ORAL
  Filled 2013-09-07: qty 1

## 2013-09-07 MED ORDER — HYDROMORPHONE HCL PF 1 MG/ML IJ SOLN: 0.2500 mg | INTRAMUSCULAR | Status: DC | PRN

## 2013-09-07 MED ORDER — SODIUM CHLORIDE 0.9 % IV SOLN
INTRAVENOUS | Status: DC
  Administered 2013-09-07: 17:00:00 via INTRAVENOUS

## 2013-09-07 MED ORDER — ONDANSETRON HCL 4 MG/2ML IJ SOLN: 4.0000 mg | Freq: Four times a day (QID) | INTRAMUSCULAR | Status: DC | PRN

## 2013-09-07 MED ORDER — KETOROLAC TROMETHAMINE 30 MG/ML IJ SOLN
INTRAMUSCULAR | Status: AC
  Filled 2013-09-07: qty 1

## 2013-09-07 MED ORDER — DEXAMETHASONE SODIUM PHOSPHATE 4 MG/ML IJ SOLN
INTRAMUSCULAR | Status: AC
  Filled 2013-09-07: qty 1

## 2013-09-07 MED ORDER — LIDOCAINE HCL (CARDIAC) 20 MG/ML IV SOLN
INTRAVENOUS | Status: DC | PRN
  Administered 2013-09-07: 20 mg via INTRAVENOUS

## 2013-09-07 MED ORDER — CLONAZEPAM 0.5 MG PO TABS: 0.5000 mg | ORAL_TABLET | Freq: Two times a day (BID) | ORAL | Status: DC | PRN

## 2013-09-07 MED ORDER — BACLOFEN 10 MG PO TABS
10.0000 mg | ORAL_TABLET | Freq: Every day | ORAL | Status: DC
  Administered 2013-09-07: 10 mg via ORAL
  Filled 2013-09-07 (×2): qty 1

## 2013-09-07 MED ORDER — MIDAZOLAM HCL 2 MG/2ML IJ SOLN
INTRAMUSCULAR | Status: AC
  Filled 2013-09-07: qty 2

## 2013-09-07 MED ORDER — PHENYLEPHRINE HCL 10 MG/ML IJ SOLN
INTRAMUSCULAR | Status: DC | PRN
  Administered 2013-09-07 (×3): 80 ug via INTRAVENOUS

## 2013-09-07 MED ORDER — ROCURONIUM BROMIDE 50 MG/5ML IV SOLN
INTRAVENOUS | Status: AC
  Filled 2013-09-07: qty 1

## 2013-09-07 MED ORDER — SACCHAROMYCES BOULARDII 250 MG PO CAPS
250.0000 mg | ORAL_CAPSULE | Freq: Two times a day (BID) | ORAL | Status: DC
  Administered 2013-09-07 – 2013-09-08 (×2): 250 mg via ORAL
  Filled 2013-09-07 (×3): qty 1

## 2013-09-07 MED ORDER — CYANOCOBALAMIN 500 MCG PO TABS
500.0000 ug | ORAL_TABLET | Freq: Every day | ORAL | Status: DC
  Administered 2013-09-07 – 2013-09-08 (×2): 500 ug via ORAL
  Filled 2013-09-07 (×2): qty 1

## 2013-09-07 MED ORDER — GLYCOPYRROLATE 0.2 MG/ML IJ SOLN
INTRAMUSCULAR | Status: DC | PRN
  Administered 2013-09-07: 0.4 mg via INTRAVENOUS

## 2013-09-07 MED ORDER — HEMOSTATIC AGENTS (NO CHARGE) OPTIME
TOPICAL | Status: DC | PRN
  Administered 2013-09-07: 1 via TOPICAL

## 2013-09-07 MED ORDER — METOCLOPRAMIDE HCL 5 MG/ML IJ SOLN: 10.0000 mg | Freq: Once | INTRAMUSCULAR | Status: DC | PRN

## 2013-09-07 MED ORDER — ONDANSETRON HCL 4 MG/2ML IJ SOLN
INTRAMUSCULAR | Status: AC
  Filled 2013-09-07: qty 2

## 2013-09-07 MED ORDER — FENTANYL CITRATE 0.05 MG/ML IJ SOLN
INTRAMUSCULAR | Status: DC | PRN
  Administered 2013-09-07 (×2): 50 ug via INTRAVENOUS

## 2013-09-07 MED ORDER — PROPOFOL 10 MG/ML IV BOLUS
INTRAVENOUS | Status: DC | PRN
  Administered 2013-09-07: 50 mg via INTRAVENOUS
  Administered 2013-09-07: 140 mg via INTRAVENOUS

## 2013-09-07 MED ORDER — NEBIVOLOL HCL 10 MG PO TABS
10.0000 mg | ORAL_TABLET | Freq: Every day | ORAL | Status: DC
  Administered 2013-09-08: 10 mg via ORAL
  Filled 2013-09-07: qty 1

## 2013-09-07 MED ORDER — SODIUM CHLORIDE 0.9 % IR SOLN
Status: DC | PRN
  Administered 2013-09-07 (×3): 1

## 2013-09-07 MED ORDER — LACTATED RINGERS IV SOLN
INTRAVENOUS | Status: DC
Start: 2013-09-07 — End: 2013-09-07
  Administered 2013-09-07: 11:00:00 via INTRAVENOUS

## 2013-09-07 MED ORDER — MORPHINE SULFATE 2 MG/ML IJ SOLN
1.0000 mg | INTRAMUSCULAR | Status: DC | PRN
  Administered 2013-09-07: 1 mg via INTRAVENOUS
  Filled 2013-09-07: qty 1

## 2013-09-07 MED ORDER — PROPOFOL 10 MG/ML IV BOLUS
INTRAVENOUS | Status: AC
  Filled 2013-09-07: qty 20

## 2013-09-07 MED ORDER — FENTANYL CITRATE 0.05 MG/ML IJ SOLN
INTRAMUSCULAR | Status: AC
  Filled 2013-09-07: qty 5

## 2013-09-07 MED ORDER — LIDOCAINE HCL (CARDIAC) 20 MG/ML IV SOLN
INTRAVENOUS | Status: AC
  Filled 2013-09-07: qty 5

## 2013-09-07 MED ORDER — ONDANSETRON HCL 4 MG/2ML IJ SOLN
INTRAMUSCULAR | Status: DC | PRN
  Administered 2013-09-07: 4 mg via INTRAVENOUS

## 2013-09-07 MED ORDER — OXYCODONE HCL 5 MG/5ML PO SOLN: 5.0000 mg | Freq: Once | ORAL | Status: DC | PRN

## 2013-09-07 MED ORDER — ROCURONIUM BROMIDE 100 MG/10ML IV SOLN
INTRAVENOUS | Status: DC | PRN
  Administered 2013-09-07: 30 mg via INTRAVENOUS
  Administered 2013-09-07: 10 mg via INTRAVENOUS

## 2013-09-07 MED ORDER — OXYCODONE HCL 5 MG PO TABS: 5.0000 mg | ORAL_TABLET | Freq: Once | ORAL | Status: DC | PRN

## 2013-09-07 MED ORDER — GLYCOPYRROLATE 0.2 MG/ML IJ SOLN
INTRAMUSCULAR | Status: AC
  Filled 2013-09-07: qty 1

## 2013-09-07 MED ORDER — NEOSTIGMINE METHYLSULFATE 10 MG/10ML IV SOLN
INTRAVENOUS | Status: DC | PRN
  Administered 2013-09-07: 3 mg via INTRAVENOUS

## 2013-09-07 MED ORDER — HYDROCODONE-ACETAMINOPHEN 5-325 MG PO TABS
1.0000 | ORAL_TABLET | ORAL | Status: DC | PRN
  Administered 2013-09-08: 1 via ORAL
  Filled 2013-09-07: qty 2

## 2013-09-07 MED ORDER — ENOXAPARIN SODIUM 30 MG/0.3ML ~~LOC~~ SOLN
30.0000 mg | SUBCUTANEOUS | Status: DC
  Administered 2013-09-08: 30 mg via SUBCUTANEOUS
  Filled 2013-09-07: qty 0.3

## 2013-09-07 MED ORDER — IBUPROFEN 600 MG PO TABS: 600.0000 mg | ORAL_TABLET | Freq: Four times a day (QID) | ORAL | Status: DC | PRN

## 2013-09-07 MED ORDER — MECLIZINE HCL 25 MG PO TABS
25.0000 mg | ORAL_TABLET | Freq: Three times a day (TID) | ORAL | Status: DC | PRN
  Filled 2013-09-07: qty 1

## 2013-09-07 MED ORDER — BUPIVACAINE-EPINEPHRINE 0.25% -1:200000 IJ SOLN
INTRAMUSCULAR | Status: DC | PRN
Start: 2013-09-07 — End: 2013-09-07
  Administered 2013-09-07: 20 mL

## 2013-09-07 MED ORDER — 0.9 % SODIUM CHLORIDE (POUR BTL) OPTIME
TOPICAL | Status: DC | PRN
  Administered 2013-09-07: 1000 mL

## 2013-09-07 SURGICAL SUPPLY — 44 items
APPLIER CLIP 5 13 M/L LIGAMAX5 (MISCELLANEOUS) ×3
APPLIER CLIP LOGIC TI 5 (MISCELLANEOUS) ×3 IMPLANT
BANDAGE ADHESIVE 1X3 (GAUZE/BANDAGES/DRESSINGS) ×12 IMPLANT
BENZOIN TINCTURE PRP APPL 2/3 (GAUZE/BANDAGES/DRESSINGS) ×3 IMPLANT
CANISTER SUCTION 2500CC (MISCELLANEOUS) ×3 IMPLANT
CHLORAPREP W/TINT 26ML (MISCELLANEOUS) ×3 IMPLANT
CLIP APPLIE 5 13 M/L LIGAMAX5 (MISCELLANEOUS) ×1 IMPLANT
CLOSURE WOUND 1/2 X4 (GAUZE/BANDAGES/DRESSINGS) ×1
COVER MAYO STAND STRL (DRAPES) IMPLANT
COVER SURGICAL LIGHT HANDLE (MISCELLANEOUS) ×3 IMPLANT
DECANTER SPIKE VIAL GLASS SM (MISCELLANEOUS) ×3 IMPLANT
DRAPE C-ARM 42X72 X-RAY (DRAPES) IMPLANT
DRAPE PROXIMA HALF (DRAPES) ×3 IMPLANT
DRAPE UTILITY 15X26 W/TAPE STR (DRAPE) ×6 IMPLANT
ELECT REM PT RETURN 9FT ADLT (ELECTROSURGICAL) ×3
ELECTRODE REM PT RTRN 9FT ADLT (ELECTROSURGICAL) ×1 IMPLANT
ENDOLOOP SUT PDS II  0 18 (SUTURE) ×2
ENDOLOOP SUT PDS II 0 18 (SUTURE) ×1 IMPLANT
GLOVE BIO SURGEON STRL SZ7.5 (GLOVE) ×6 IMPLANT
GLOVE BIOGEL PI IND STRL 7.5 (GLOVE) ×2 IMPLANT
GLOVE BIOGEL PI INDICATOR 7.5 (GLOVE) ×4
GLOVE SURG SIGNA 7.5 PF LTX (GLOVE) ×3 IMPLANT
GOWN STRL REUS W/ TWL LRG LVL3 (GOWN DISPOSABLE) ×2 IMPLANT
GOWN STRL REUS W/ TWL XL LVL3 (GOWN DISPOSABLE) ×1 IMPLANT
GOWN STRL REUS W/TWL LRG LVL3 (GOWN DISPOSABLE) ×4
GOWN STRL REUS W/TWL XL LVL3 (GOWN DISPOSABLE) ×2
HEMOSTAT SNOW SURGICEL 2X4 (HEMOSTASIS) ×3 IMPLANT
KIT BASIN OR (CUSTOM PROCEDURE TRAY) ×3 IMPLANT
KIT ROOM TURNOVER OR (KITS) ×3 IMPLANT
NS IRRIG 1000ML POUR BTL (IV SOLUTION) ×3 IMPLANT
PAD ARMBOARD 7.5X6 YLW CONV (MISCELLANEOUS) ×3 IMPLANT
POUCH SPECIMEN RETRIEVAL 10MM (ENDOMECHANICALS) IMPLANT
SCISSORS LAP 5X35 DISP (ENDOMECHANICALS) ×3 IMPLANT
SET CHOLANGIOGRAPH 5 50 .035 (SET/KITS/TRAYS/PACK) IMPLANT
SET IRRIG TUBING LAPAROSCOPIC (IRRIGATION / IRRIGATOR) ×3 IMPLANT
SLEEVE ENDOPATH XCEL 5M (ENDOMECHANICALS) ×6 IMPLANT
SPECIMEN JAR SMALL (MISCELLANEOUS) ×3 IMPLANT
STRIP CLOSURE SKIN 1/2X4 (GAUZE/BANDAGES/DRESSINGS) ×2 IMPLANT
SUT MON AB 4-0 PC3 18 (SUTURE) ×3 IMPLANT
TOWEL OR 17X24 6PK STRL BLUE (TOWEL DISPOSABLE) ×3 IMPLANT
TOWEL OR 17X26 10 PK STRL BLUE (TOWEL DISPOSABLE) ×3 IMPLANT
TRAY LAPAROSCOPIC (CUSTOM PROCEDURE TRAY) ×3 IMPLANT
TROCAR XCEL BLUNT TIP 100MML (ENDOMECHANICALS) ×3 IMPLANT
TROCAR XCEL NON-BLD 5MMX100MML (ENDOMECHANICALS) ×3 IMPLANT

## 2013-09-07 NOTE — Transfer of Care (Signed)
Immediate Anesthesia Transfer of Care Note  Patient: [name] HeinzJohn F Manfred  Procedure(s) Performed: Procedure(s): LAPAROSCOPIC CHOLECYSTECTOMY  (N/A)  Patient Location: PACU  Anesthesia Type:General  Level of Consciousness: sedated and patient cooperative  Airway & Oxygen Therapy: Patient Spontanous Breathing and Patient connected to nasal cannula oxygen  Post-op Assessment: Report given to PACU RN, Post -op Vital signs reviewed and stable and Patient moving all extremities  Post vital signs: Reviewed and stable  Complications: No apparent anesthesia complications

## 2013-09-07 NOTE — Anesthesia Procedure Notes (Signed)
Procedure Name: Intubation Date/Time: 09/07/2013 12:43 PM Performed by: Jerilee HohMUMM, Aleesha Ringstad N Pre-anesthesia Checklist: Patient identified, Emergency Drugs available, Suction available and Patient being monitored Patient Re-evaluated:Patient Re-evaluated prior to inductionOxygen Delivery Method: Circle system utilized Preoxygenation: Pre-oxygenation with 100% oxygen Intubation Type: IV induction Ventilation: Mask ventilation without difficulty Laryngoscope Size: Mac and 4 Grade View: Grade I Tube type: Oral Tube size: 7.5 mm Number of attempts: 1 Airway Equipment and Method: Stylet Placement Confirmation: ETT inserted through vocal cords under direct vision,  positive ETCO2 and breath sounds checked- equal and bilateral Secured at: 23 cm Tube secured with: Tape Dental Injury: Teeth and Oropharynx as per pre-operative assessment

## 2013-09-07 NOTE — Anesthesia Preprocedure Evaluation (Signed)
Anesthesia Evaluation  Patient identified by MRN, date of birth, ID band Patient awake    Reviewed: Allergy & Precautions, H&P , NPO status , Patient's Chart, lab work & pertinent test results, reviewed documented beta blocker date and time   Airway Mallampati: II TM Distance: >3 FB Neck ROM: full    Dental   Pulmonary neg pulmonary ROS,  breath sounds clear to auscultation        Cardiovascular hypertension, On Medications and On Home Beta Blockers Rhythm:regular     Neuro/Psych PSYCHIATRIC DISORDERS CVA, Residual Symptoms    GI/Hepatic negative GI ROS, Neg liver ROS,   Endo/Other  negative endocrine ROS  Renal/GU negative Renal ROS  negative genitourinary   Musculoskeletal   Abdominal   Peds  Hematology negative hematology ROS (+)   Anesthesia Other Findings See surgeon's H&P   Reproductive/Obstetrics negative OB ROS                           Anesthesia Physical Anesthesia Plan  ASA: III  Anesthesia Plan: General   Post-op Pain Management:    Induction: Intravenous  Airway Management Planned: Oral ETT  Additional Equipment:   Intra-op Plan:   Post-operative Plan: Extubation in OR  Informed Consent: I have reviewed the patients History and Physical, chart, labs and discussed the procedure including the risks, benefits and alternatives for the proposed anesthesia with the patient or authorized representative who has indicated his/her understanding and acceptance.   Dental Advisory Given  Plan Discussed with: CRNA and Surgeon  Anesthesia Plan Comments:         Anesthesia Quick Evaluation

## 2013-09-07 NOTE — Interval H&P Note (Signed)
History and Physical Interval Note: no change in H and P  09/07/2013 12:22 PM  [name]  has presented today for surgery, with the diagnosis of CHOLECYSTISTIS   The various methods of treatment have been discussed with the patient and family. After consideration of risks, benefits and other options for treatment, the patient has consented to  Procedure(s): LAPAROSCOPIC CHOLECYSTECTOMY WITH INTRAOPERATIVE CHOLANGIOGRAM (N/A) as a surgical intervention .  The patient's history has been reviewed, patient examined, no change in status, stable for surgery.  I have reviewed the patient's chart and labs.  Questions were answered to the patient's satisfaction.     Shelly Rubensteinouglas A Weiland Tomich

## 2013-09-07 NOTE — Op Note (Signed)
LAPAROSCOPIC CHOLECYSTECTOMY   Procedure Note  [name] HeinzJohn F [name] 09/07/2013   Pre-op Diagnosis: CALCULOUS CHOLECYSTISTIS      Post-op Diagnosis: SAME  Procedure(s): LAPAROSCOPIC CHOLECYSTECTOMY   Surgeon(s): Shelly Rubensteinouglas A Airiel Oblinger, MD  Anesthesia: General  Staff:  Circulator: Doy MinceSharon P Hitchcock, RN Relief Circulator: Isidoro DonningAnna E Butler, RN Scrub Person: Denese KillingsMichael Vincent Pingue, CST Circulator Assistant: Ivor CostaLaurel L Edwards, RN; Maureen RalphsSheila C Bowman, RN RN First Assistant: Maureen RalphsSheila C Bowman, RN  Estimated Blood Loss: Minimal                         Shelly Rubensteinouglas A Nai Dasch   Date: 09/07/2013  Time: 2:02 PM

## 2013-09-07 NOTE — Anesthesia Postprocedure Evaluation (Signed)
  Anesthesia Post-op Note  Patient: [name]  Procedure(s) Performed: Procedure(s): LAPAROSCOPIC CHOLECYSTECTOMY  (N/A)  Patient Location: PACU  Anesthesia Type:General  Level of Consciousness: awake, alert , patient cooperative, confused and responds to stimulation  Airway and Oxygen Therapy: Patient Spontanous Breathing and Patient connected to nasal cannula oxygen  Post-op Pain: none  Post-op Assessment: Post-op Vital signs reviewed, Patient's Cardiovascular Status Stable, Respiratory Function Stable, Patent Airway, No signs of Nausea or vomiting and Pain level controlled  Post-op Vital Signs: Reviewed and stable  Last Vitals:  Filed Vitals:   09/07/13 1515  BP: 132/65  Pulse: 65  Temp: 37.1 C  Resp: 12    Complications: No apparent anesthesia complications

## 2013-09-08 ENCOUNTER — Encounter (HOSPITAL_COMMUNITY): Payer: Self-pay | Admitting: Surgery

## 2013-09-08 NOTE — Clinical Social Work Psychosocial (Signed)
Clinical Social Work Department BRIEF PSYCHOSOCIAL ASSESSMENT 09/08/2013  Patient:  [name] HeinzSMALL,[name]     Account Number:  [id number]     Admit date:  09/07/2013  Clinical Social Worker:  Sherre LainSUMMERVILLE,[name], LCSWA  Date/Time:  09/08/2013 01:04 PM  Referred by:  Physician  Date Referred:  09/08/2013 Referred for  SNF Placement   Other Referral:   none.   Interview type:  Family Other interview type:   CSW spoke with pt's wife regarding discharge.    PSYCHOSOCIAL DATA Living Status:  FACILITY [facility] Level of care:  Skilled Nursing Facility Primary support name:  [name] McalpineJean [name] Primary support relationship to patient:  SPOUSE Degree of support available:   Strong support system.    CURRENT CONCERNS Current Concerns  Post-Acute Placement   Other Concerns:   none.    SOCIAL WORK ASSESSMENT / PLAN CSW received [facility]. CSW was consulted same day as discharge. CSW spoke with pt's wife regarding discharge disposition. Pt's wife confirmed discharge disposition of [facility].    CSW [facility]. Per admissions liaison, pt requires new SNF authorization from pt's health insurance [facility]. CSW has faxed appropriate documentation [facility] for [facility].    CSW to continue to follow and assist with discharge planning needs.   Assessment/plan status:  Psychosocial Support/Ongoing Assessment of Needs Other assessment/ plan:   none.   Information/referral to community resources:   [facility]    PATIENTS/FAMILYS RESPONSE TO PLAN OF CARE: Pt's wife understanding and agreeable to CSW plan of care. Pt's wife expressed concern for "quickness" of pt's discharge. Pt's wife stated affirmation from MD regarding pt's medical stability would satisfy any concerns. RN to contact MD  regarding pt's wife concern.       [name] ChamberEmily [name], LCSWA Clinical Social Worker [phone]

## 2013-09-08 NOTE — Clinical Social Work Note (Addendum)
CSW received call from [facility] representative requiring PT documentation to authorize SNF placement. RN updated on information above. RN has consulted with MD and order has been placed for STAT PT evaluation.  CSW to [facility] once completed.   CSW received [facility] : [id number]. [facility]. RN notified.  RN please call report [facility] at [phone]  Darlyn ChamberEmily [place], [facility] Asc[facility]WA Clinical Social Worker [phone]

## 2013-09-08 NOTE — Progress Notes (Signed)
1 Day Post-Op  Subjective: Looks good, mild incisional pain  Objective: Vital signs in last 24 hours: Temp:  [97.4 F (36.3 C)-98.7 F (37.1 C)] 97.5 F (36.4 C) (05/01 0534) Pulse Rate:  [63-88] 84 (05/01 0534) Resp:  [12-20] 16 (05/01 0534) BP: (131-150)/(65-91) 150/76 mmHg (05/01 0534) SpO2:  [94 %-100 %] 96 % (05/01 0534) Weight:  [175 lb (79.379 kg)-183 lb (83.008 kg)] 175 lb (79.379 kg) (04/30 1614) Last BM Date: 09/05/13  Intake/Output from previous day: 04/30 0701 - 05/01 0700 In: 800 [I.V.:800] Out: 50 [Blood:50] Intake/Output this shift:    Abdomen soft, dressings dry  Lab Results:   Recent Labs  09/07/13 1058  WBC 10.9*  HGB 13.6  HCT 42.2  PLT 364   BMET  Recent Labs  09/07/13 1058  NA 137  K 4.2  CL 100  CO2 22  GLUCOSE 97  BUN 14  CREATININE 0.95  CALCIUM 9.0   PT/INR No results found for this basename: LABPROT, INR,  in the last 72 hours ABG No results found for this basename: PHART, PCO2, PO2, HCO3,  in the last 72 hours  Studies/Results: No results found.  Anti-infectives: Anti-infectives   Start     Dose/Rate Route Frequency Ordered Stop   09/07/13 0600  ciprofloxacin (CIPRO) IVPB 400 mg     400 mg 200 mL/hr over 60 Minutes Intravenous On call to O.R. 09/06/13 1437 09/07/13 1244      Assessment/Plan: s/p Procedure(s): LAPAROSCOPIC CHOLECYSTECTOMY  (N/A)  Stable s/p lap chole Will discharge back to SNF  LOS: 1 day    Shelly RubensteinDouglas A Zanyla Klebba 09/08/2013

## 2013-09-08 NOTE — Progress Notes (Signed)
Report called to nurse at [facility].

## 2013-09-08 NOTE — Op Note (Signed)
[name]:  Mittag, Camp                  ACCOUNT NO.:  [id number]  MEDICAL RECORD NO.:  [id number]  LOCATION:  6N04C                        FACILITY:  [facility]  PHYSICIAN:  Abigail Miyamotoouglas Torry Adamczak, M.D. DATE OF BIRTH:  05-21-1935  DATE OF PROCEDURE:  09/07/2013 DATE OF DISCHARGE:                              OPERATIVE REPORT   PREOPERATIVE DIAGNOSIS:  Calculous cholecystitis.  POSTOP DIAGNOSIS:  Calculous cholecystitis.  PROCEDURES:  Laparoscopic cholecystectomy.  SURGEON:  Abigail Miyamotoouglas Danil Wedge, M.D.  ANESTHESIA:  General endotracheal anesthesia.  ESTIMATED BLOOD LOSS:  Minimal.  INDICATIONS:  This is a 78 year old gentleman, who is approximately 6 weeks status post percutaneous cholecystostomy for severe calculous cholecystitis.  At that time of his admission, he was medically unfit for general anesthesia.  Therefore, a percutaneous drain was placed in his gallbladder secondary to cholecystitis.  He has now improved and is currently in a rehab facility.  Decision has been made to proceed with cholecystectomy.  FINDINGS:  Upon removal of the bandage around his cholecystostomy drain, the drain had already pulled completely out of the skin, so it is uncertain how many days this has been out of his gallbladder.  The gallbladder itself was intensely inflamed with large amounts stones involved.  PROCEDURE IN DETAIL:  The patient was brought to the operating room, identified as [name].  He was placed supine on the operating room table and general anesthesia was induced.  A Pitcher vertical incision was made above the umbilicus.  This was carried down the fascia, which was then opened scalpel.  A hemostat was used to pass the peritoneal cavity under direct vision.  Zero Vicryl pursestring suture were placed around the fascial opening.  The Hasson port was placed through the opening and insufflation of the abdomen was begun.  A 5-mm port was placed in the patient's epigastrium and tumor in the  right upper quadrant all under direct vision.  The gallbladder and the liver was stuck to the abdominal wall from the percutaneous drain.  I was able to peel the gallbladder and liver slowly off of the surface of diaphragm with both blunt dissection and cautery.  The gallbladder was then grasped and retracted above the liver bed.  Several adhesions were taken down from the gallbladder.  At this point, the gallbladder tore and a large amount of tiny gallstones spilled from the gallbladder.  I was then able to dissect out the base of the gallbladder.  A Pasch bridging vein was identified and clipped with surgical clips and transected.  I was then able to dissect out a quite inflamed fundus and hilum of the gallbladder.  I was able to identify the cystic duct and I achieved a critical window around it.  It was clipped 3 times proximally, once distally, and transected it.  Cystic artery and posterior branch was then identified and clipped and cut as well.  Because of the friability of the gallbladder, I also placed an Endoloop around the cystic duct stump.  The gallbladder was then slowly dissected free from the liver bed with electrocautery.  Once this was free from the liver bed, I achieved hemostasis and the liver bed with a piece of surgical  snow and electrocautery.  I then thoroughly irrigated the abdomen with multiple liters of normal saline.  It appeared all Sabino gallstones that had spilled.  The gallbladder was removed.  At this point, hemostasis appeared to be achieved.  All ports removed under direct vision.  The abdomen was deflated.  The 0 Vicryl at the umbilicus was tied in place closing the fascial defect.  All incisions were then anesthetized with Marcaine and closed with 4-0 Monocryl subcuticular sutures.  Steri- Strips and Band-Aids were then applied.  The patient tolerated the procedure well.  All the counts were correct at the end of procedure. The patient was then  extubated in operating room and taken in stable condition to recovery room.     Abigail Miyamotoouglas Pinchas Reither, M.D.     DB/MEDQ  D:  09/07/2013  T:  09/08/2013  Job:  [id number]

## 2013-09-08 NOTE — Discharge Summary (Signed)
Physician Discharge Summary  Patient ID: [name] MRN: [id number] DOB/AGE: 76/07/1935 76 y.o.  Admit date: 09/07/2013 Discharge date: 09/08/2013  Admission Diagnoses:  Discharge Diagnoses:  Active Problems:   Calculous cholecystitis   Discharged Condition: good  Hospital Course: uneventful post op recovery  Consults: None  Significant Diagnostic Studies:   Treatments: surgery: lap chole  Discharge Exam: Blood pressure 150/76, pulse 84, temperature 97.5 F (36.4 C), temperature source Oral, resp. rate 16, height 6' (1.829 m), weight 175 lb (79.379 kg), SpO2 96.00%. General appearance: alert, cooperative and no distress Resp: clear to auscultation bilaterally Cardio: regular rate and rhythm, S1, S2 normal, no murmur, click, rub or gallop Incision/Wound: abdomen soft, dressings dry  Disposition: 03-Skilled Nursing Facility   Future Appointments Provider Department Dept Phone   10/18/2013 3:00 PM Melvyn Novasarmen Dohmeier, MD [name] [phone]       Medication List         ascorbic acid 1000 MG tablet  Commonly known as:  VITAMIN C  Take 1,000 mg by mouth daily.     aspirin 325 MG tablet  Take 325 mg by mouth daily.     baclofen 10 MG tablet  Commonly known as:  LIORESAL  Take 1 tablet (10 mg total) by mouth at bedtime.     BYSTOLIC 10 MG tablet  Generic drug:  nebivolol  Take 10 mg by mouth daily.     CALCIUM + D PO  Take 1 tablet by mouth daily.     cholecalciferol 1000 UNITS tablet  Commonly known as:  VITAMIN D  Take 1,000 Units by mouth daily.     clonazePAM 0.5 MG tablet  Commonly known as:  KLONOPIN  Take 1 tablet (0.5 mg total) by mouth 2 (two) times daily as needed (anxiety).     cyanocobalamin 500 MCG tablet  Take 500 mcg by mouth daily.     diclofenac sodium 1 % Gel  Commonly known as:  VOLTAREN  Apply 2 g topically 4 (four) times daily as needed (Pain).     EPIPEN 0.3 mg/0.3 mL Devi  Generic drug:  EPINEPHrine  Inject  0.3 mg into the muscle once. As needed for anaphylaxis (unknown cause)     GENTEAL 0.3 % Soln  Generic drug:  Hypromellose  Place 1 drop into both eyes 3 (three) times daily as needed (dry eyes).     HYDROcodone-acetaminophen 5-325 MG per tablet  Commonly known as:  NORCO/VICODIN  Take 1-2 tablets by mouth every 4 (four) hours as needed for moderate pain.     lidocaine 5 %  Commonly known as:  LIDODERM  Place 1 patch onto the skin daily as needed (Back pain). Remove & Discard patch within 12 hours or as directed by MD     meclizine 25 MG tablet  Commonly known as:  ANTIVERT  Take 25 mg by mouth 3 (three) times daily as needed for dizziness.     multivitamin with minerals Tabs tablet  Take 1 tablet by mouth daily.     saccharomyces boulardii 250 MG capsule  Commonly known as:  FLORASTOR  Take 1 capsule (250 mg total) by mouth 2 (two) times daily.           Follow-up Information   [facility] Pap[facility], [facility]. Schedule an appointment as soon as possible for a visit in 3 weeks.   Specialty:  General Surgery   Contact information:   [address] [place] [address]  [phone]       Signed: Shelly Rubensteinouglas A Naythen Heikkila 09/08/2013, 7:13 AM

## 2013-09-08 NOTE — Evaluation (Signed)
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, dementia, vertigo, HTN, R TKA,  back surgery   Clinical Impression    Pt will benefit from PT at SNF level to address deficits below; He was making gains per wife prior to this surgery but now weaker and more mentally "sluggish" per wife; Wife would be unable to care for him at his current status;         Follow Up Recommendations SNF    Equipment Recommendations  None recommended by PT    Recommendations for Other Services       Precautions / Restrictions Precautions Precautions: Fall Precaution Comments: right hemiparesis      Mobility  Bed Mobility Overal bed mobility: Needs Assistance Bed Mobility: Supine to Sit;Sit to Supine     Supine to sit: Max assist;HOB elevated Sit to supine: Total assist   General bed mobility comments: +2 total assist for scooting in supine, pt wife assisting; He requires cues to stay on task and assist with trunk and LEs  Transfers Overall transfer level: Needs assistance Equipment used: Rolling walker (2 wheeled);None Transfers: Sit to/from Stand Sit to Stand: Total assist;+2 physical assistance;Max assist         General transfer comment: transfers x 2 trials; pt requires total assist for safety, anterior-superior wt shift, for RLE support; pt extends RLE in front of him (hyper-extends right knee) and braces left LE on bed to maintain upright   Ambulation/Gait                Stairs            Wheelchair Mobility    Modified Rankin (Stroke Patients Only)       Balance Overall balance assessment: Needs assistance Sitting-balance support:  Single extremity supported;Feet supported Sitting balance-Leahy Scale: Poor Sitting balance - Comments: sat EOB x 9 min, pt unable to wt shift and with frequent LOB posteriorly, multi-modal cues to correct Postural control: Posterior lean;Right lateral lean   Standing balance-Leahy Scale: Zero                               Pertinent Vitals/Pain Denies pain    Home Living Family/patient expects to be discharged to:: Private residence               Home Equipment: Transport chair;Other (comment);Bedside commode;Walker - 2 wheels Additional Comments: has platform RW    Prior Function Level of Independence: Needs assistance   Gait / Transfers Assistance Needed: Pt performs SPT with MOD A of wife.  I with bed mobility until 1 week ago.     Comments: pt has been at [facility] since April 1,plans to go back there     Hand Dominance        Extremity/Trunk Assessment   Upper Extremity Assessment: RUE deficits/detail RUE Deficits / Details: PROM grossly WFL at elbow and hand; dense hemiparesis         Lower Extremity Assessment: RLE deficits/detail RLE Deficits / Details: pt able to assist with knee extension and hip flexion, unable to WB or use RLE functionally for transfers;  he has incr extensor tone  with movment;    Cervical / Trunk Assessment: Kyphotic  Communication   Communication: HOH  Cognition Arousal/Alertness: Awake/alert Behavior During Therapy: WFL for tasks assessed/performed Overall Cognitive Status: History of cognitive impairments - at baseline (per wife he is nmormally oriented to place/time)                      General Comments      Exercises        Assessment/Plan    PT Assessment All further PT needs can be met in the next venue of care  PT Diagnosis     PT Problem List Decreased balance;Decreased mobility;Decreased activity tolerance;Decreased safety awareness;Decreased coordination;Decreased strength  PT  Treatment Interventions     PT Goals (Current goals can be found in the Care Plan section) Acute Rehab PT Goals Patient Stated Goal: to walk short distances PT Goal Formulation: With patient/family Potential to Achieve Goals: Fair    Frequency     Barriers to discharge        Co-evaluation               End of Session   Activity Tolerance: Patient tolerated treatment well Patient left: in bed;with call bell/phone within reach;with bed alarm set Nurse Communication: Mobility status    Functional Assessment Tool Used: clinical observation Functional Limitation: Mobility: Walking and moving around Mobility: Walking and Moving Around Current Status (F6384): At least 80 percent but less than 100 percent impaired, limited or restricted Mobility: Walking and Moving Around Goal Status [phone]): At least 80 percent but less than 100 percent impaired, limited or restricted Mobility: Walking and Moving Around Discharge Status [phone]): At least 80 percent but less than 100 percent impaired, limited or restricted    Time: 1401-1441 PT Time Calculation (min): 40 min   Charges:   PT Evaluation $Initial PT Evaluation Tier I: 1 Procedure PT Treatments $Therapeutic Activity: 38-52 mins   PT G Codes:   Functional Assessment Tool Used: clinical observation Functional Limitation: Mobility: Walking and moving around    [employer] 09/08/2013, 2:55 PM

## 2013-09-08 NOTE — Discharge Instructions (Signed)
[facility] ______[facility] °LAPAROSCOPIC SURGERY: POST OP INSTRUCTIONS °Always review your discharge instruction sheet given to you by the facility where your surgery was performed. °IF YOU HAVE DISABILITY OR FAMILY LEAVE FORMS, YOU MUST BRING THEM TO THE OFFICE FOR PROCESSING.   °DO NOT GIVE THEM TO YOUR DOCTOR. ° °1. A prescription for pain medication may be given to you upon discharge.  Take your pain medication as prescribed, if needed.  If narcotic pain medicine is not needed, then you may take acetaminophen (Tylenol) or ibuprofen (Advil) as needed. °2. Take your usually prescribed medications unless otherwise directed. °3. If you need a refill on your pain medication, please contact your pharmacy.  They will contact our office to request authorization. Prescriptions will not be filled after 5pm or on week-ends. °4. You should follow a light diet the first few days after arrival home, such as soup and crackers, etc.  Be sure to include lots of fluids daily. °5. Most patients will experience some swelling and bruising in the area of the incisions.  Ice packs will help.  Swelling and bruising can take several days to resolve.  °6. It is common to experience some constipation if taking pain medication after surgery.  Increasing fluid intake and taking a stool softener (such as Colace) will usually help or prevent this problem from occurring.  A mild laxative (Milk of Magnesia or Miralax) should be taken according to package instructions if there are no bowel movements after 48 hours. °7. Unless discharge instructions indicate otherwise, you may remove your bandages 24-48 hours after surgery, and you may shower at that time.  You may have steri-strips (Tallent skin tapes) in place directly over the incision.  These strips should be left on the skin for 7-10 days.  If your surgeon used skin glue on the incision, you may shower in 24 hours.  The glue will flake off over the next 2-3 weeks.  Any sutures or  staples will be removed at the office during your follow-up visit. °8. ACTIVITIES:  You may resume regular (light) daily activities beginning the next day--such as daily self-care, walking, climbing stairs--gradually increasing activities as tolerated.  You may have sexual intercourse when it is comfortable.  Refrain from any heavy lifting or straining until approved by your doctor. °a. You may drive when you are no longer taking prescription pain medication, you can comfortably wear a seatbelt, and you can safely maneuver your car and apply brakes. °b. RETURN TO WORK:  __________________________________________________________ °9. You should see your doctor in the office for a follow-up appointment approximately 2-3 weeks after your surgery.  Make sure that you call for this appointment within a day or two after you arrive home to insure a convenient appointment time. °10. OTHER INSTRUCTIONS: __________________________________________________________________________________________________________________________ __________________________________________________________________________________________________________________________ °WHEN TO CALL YOUR DOCTOR: °1. Fever over 101.0 °2. Inability to urinate °3. Continued bleeding from incision. °4. Increased pain, redness, or drainage from the incision. °5. Increasing abdominal pain ° °The clinic staff is available to answer your questions during regular business hours.  Please don’t hesitate to call and ask to speak to one of the nurses for clinical concerns.  If you have a medical emergency, go to the nearest emergency room or call 911.  A surgeon from [facility] is always on call at the hospital. °[address], [address], [place], [place]  [address] ? [address] [address], [place], [place]   [address] °[phone] ? [phone] ? [phone] [phone] °Web site:   [email or web address] °

## 2013-09-27 ENCOUNTER — Encounter (HOSPITAL_COMMUNITY): Payer: Self-pay | Admitting: Emergency Medicine

## 2013-09-27 ENCOUNTER — Emergency Department (HOSPITAL_COMMUNITY): Payer: Medicare Other

## 2013-09-27 ENCOUNTER — Emergency Department (HOSPITAL_COMMUNITY)
Admission: EM | Admit: 2013-09-27 | Discharge: 2013-09-27 | Disposition: A | Discharge status: Home / Self Care | Payer: Medicare Other | Attending: Emergency Medicine | Admitting: Emergency Medicine

## 2013-09-27 DIAGNOSIS — Z96659 Presence of unspecified artificial knee joint: Secondary | ICD-10-CM | POA: Insufficient documentation

## 2013-09-27 DIAGNOSIS — F0390 Unspecified dementia without behavioral disturbance: Secondary | ICD-10-CM | POA: Insufficient documentation

## 2013-09-27 DIAGNOSIS — Z87891 Personal history of nicotine dependence: Secondary | ICD-10-CM | POA: Insufficient documentation

## 2013-09-27 DIAGNOSIS — Z859 Personal history of malignant neoplasm, unspecified: Secondary | ICD-10-CM | POA: Insufficient documentation

## 2013-09-27 DIAGNOSIS — J9801 Acute bronchospasm: Secondary | ICD-10-CM | POA: Insufficient documentation

## 2013-09-27 DIAGNOSIS — R05 Cough: Secondary | ICD-10-CM

## 2013-09-27 DIAGNOSIS — R4789 Other speech disturbances: Secondary | ICD-10-CM | POA: Insufficient documentation

## 2013-09-27 DIAGNOSIS — I10 Essential (primary) hypertension: Secondary | ICD-10-CM | POA: Insufficient documentation

## 2013-09-27 DIAGNOSIS — Z862 Personal history of diseases of the blood and blood-forming organs and certain disorders involving the immune mechanism: Secondary | ICD-10-CM | POA: Insufficient documentation

## 2013-09-27 DIAGNOSIS — Z7982 Long term (current) use of aspirin: Secondary | ICD-10-CM | POA: Insufficient documentation

## 2013-09-27 DIAGNOSIS — R059 Cough, unspecified: Secondary | ICD-10-CM

## 2013-09-27 DIAGNOSIS — G8929 Other chronic pain: Secondary | ICD-10-CM | POA: Insufficient documentation

## 2013-09-27 DIAGNOSIS — Z87820 Personal history of traumatic brain injury: Secondary | ICD-10-CM | POA: Insufficient documentation

## 2013-09-27 DIAGNOSIS — Z880 Allergy status to penicillin: Secondary | ICD-10-CM | POA: Insufficient documentation

## 2013-09-27 DIAGNOSIS — Z79899 Other long term (current) drug therapy: Secondary | ICD-10-CM | POA: Insufficient documentation

## 2013-09-27 DIAGNOSIS — Z8719 Personal history of other diseases of the digestive system: Secondary | ICD-10-CM | POA: Insufficient documentation

## 2013-09-27 DIAGNOSIS — Z8639 Personal history of other endocrine, nutritional and metabolic disease: Secondary | ICD-10-CM | POA: Insufficient documentation

## 2013-09-27 LAB — CBC WITH DIFFERENTIAL/PLATELET
BASOS PCT: 1 % (ref 0–1)
Basophils Absolute: 0 10*3/uL (ref 0.0–0.1)
Eosinophils Absolute: 0.2 10*3/uL (ref 0.0–0.7)
Eosinophils Relative: 3 % (ref 0–5)
HCT: 36.9 % — ABNORMAL LOW (ref 39.0–52.0)
Hemoglobin: 11.5 g/dL — ABNORMAL LOW (ref 13.0–17.0)
Lymphocytes Relative: 24 % (ref 12–46)
Lymphs Abs: 2.1 10*3/uL (ref 0.7–4.0)
MCH: 25.6 pg — ABNORMAL LOW (ref 26.0–34.0)
MCHC: 31.2 g/dL (ref 30.0–36.0)
MCV: 82 fL (ref 78.0–100.0)
Monocytes Absolute: 0.7 10*3/uL (ref 0.1–1.0)
Monocytes Relative: 8 % (ref 3–12)
NEUTROS ABS: 5.8 10*3/uL (ref 1.7–7.7)
NEUTROS PCT: 64 % (ref 43–77)
PLATELETS: 458 10*3/uL — AB (ref 150–400)
RBC: 4.5 MIL/uL (ref 4.22–5.81)
RDW: 16.4 % — AB (ref 11.5–15.5)
WBC: 8.9 10*3/uL (ref 4.0–10.5)

## 2013-09-27 LAB — COMPREHENSIVE METABOLIC PANEL
ALBUMIN: 2.7 g/dL — AB (ref 3.5–5.2)
ALK PHOS: 73 U/L (ref 39–117)
ALT: 22 U/L (ref 0–53)
AST: 18 U/L (ref 0–37)
BILIRUBIN TOTAL: 0.3 mg/dL (ref 0.3–1.2)
BUN: 12 mg/dL (ref 6–23)
CHLORIDE: 103 meq/L (ref 96–112)
CO2: 25 mEq/L (ref 19–32)
Calcium: 8.7 mg/dL (ref 8.4–10.5)
Creatinine, Ser: 1.1 mg/dL (ref 0.50–1.35)
GFR calc Af Amer: 73 mL/min — ABNORMAL LOW (ref >90)
GFR calc non Af Amer: 63 mL/min — ABNORMAL LOW (ref >90)
Glucose, Bld: 113 mg/dL — ABNORMAL HIGH (ref 70–99)
POTASSIUM: 4.3 meq/L (ref 3.7–5.3)
SODIUM: 140 meq/L (ref 137–147)
Total Protein: 6.6 g/dL (ref 6.0–8.3)

## 2013-09-27 LAB — URINALYSIS, ROUTINE W REFLEX MICROSCOPIC
Bilirubin Urine: NEGATIVE
GLUCOSE, UA: NEGATIVE mg/dL
Hgb urine dipstick: NEGATIVE
KETONES UR: NEGATIVE mg/dL
Leukocytes, UA: NEGATIVE
Nitrite: NEGATIVE
PROTEIN: NEGATIVE mg/dL
Specific Gravity, Urine: 1.025 (ref 1.005–1.030)
Urobilinogen, UA: 0.2 mg/dL (ref 0.0–1.0)
pH: 6 (ref 5.0–8.0)

## 2013-09-27 LAB — PROTIME-INR
INR: 1.08 (ref 0.00–1.49)
Prothrombin Time: 13.8 seconds (ref 11.6–15.2)

## 2013-09-27 LAB — RAPID URINE DRUG SCREEN, HOSP PERFORMED
Amphetamines: NOT DETECTED
Barbiturates: NOT DETECTED
Benzodiazepines: NOT DETECTED
COCAINE: NOT DETECTED
OPIATES: NOT DETECTED
TETRAHYDROCANNABINOL: NOT DETECTED

## 2013-09-27 LAB — ETHANOL: Alcohol, Ethyl (B): <11 mg/dL (ref 0–11)

## 2013-09-27 LAB — TROPONIN I: Troponin I: <0.3 ng/mL (ref <0.30)

## 2013-09-27 LAB — PRO B NATRIURETIC PEPTIDE: PRO B NATRI PEPTIDE: 536.7 pg/mL — AB (ref 0–450)

## 2013-09-27 LAB — APTT: APTT: 35 s (ref 24–37)

## 2013-09-27 MED ORDER — BENZONATATE 100 MG PO CAPS: 100.0000 mg | ORAL_CAPSULE | Freq: Three times a day (TID) | ORAL | Status: DC | PRN

## 2013-09-27 MED ORDER — LORATADINE 10 MG PO TABS: 10.0000 mg | ORAL_TABLET | Freq: Every day | ORAL | Status: DC

## 2013-09-27 NOTE — ED Notes (Signed)
Per EMS: pt coming from home. Wife called EMS for "respiratory distress". Wife states pt has been coughing all day. Pt was 95% on RA upon EMS arrival. Pt has hx of head/spine injury resulting in right sided weakness. Wife states pt's speech has been slurred for the past two days. Pt denies pain. Pt A&Ox4, respirations equal and unlabored, skin warm and dry. Pt was given 5 mg of Albuterol en route.

## 2013-09-27 NOTE — ED Provider Notes (Signed)
CSN: [id number]     Arrival date & time 09/27/13  0347 History   First MD Initiated Contact with Patient 09/27/13 [phone]     Chief Complaint  Patient presents with  . Respiratory Distress     (Consider location/radiation/quality/duration/timing/severity/associated sxs/prior Treatment) HPI Patient presents via EMS for cough and difficulty breathing. Patient has a history of dementia and right-sided hemiparesis secondary to a traumatic brain injury. He denies any shortness of breath or pain at this point. He is given a albuterol treatment by EMS for questionable wheezing en route. He denies any fevers or chills. Wife EMS the patient has had slurred speech for the past 2 days. His initial oxygen saturation was 95% on room air. His vital signs have been stable. He denies any lower extremity swelling or pain. Patient recently had a lap cholecystectomy at the end of April. He had no complications. Past Medical History  Diagnosis Date  . Hemiplegia 09/07/1970    "horse fell on him"  . Vertigo   . Hypertension   . TBI (traumatic brain injury) 09/07/1970    WITH LEFT HEMISPHERIC BLEED  . High cholesterol     "not on RX; taken off d/t vertigo fall 2014"  . Chronic pain     lower back "hasn't complained in months" (09/07/2013)  . Dementia   . Swelling of upper lip     tongue  . Weakness generalized 12/22/2012    "not a problem lately" (09/07/2013)  . Dementia with behavioral disturbance 04/18/2013  . Cholecystitis   . H/O hiatal hernia   . Arthritis     "fingers" (09/07/2013)  . Cancer   . Clonus     "RLE" "hadn't bothered him for awhile; at least 6 months" (09/07/2013)  . Familial tremor    Past Surgical History  Procedure Laterality Date  . Back surgery    . Total knee arthroplasty Right ~ 2000  . Posterior laminectomy / decompression lumbar spine  2006  . Colon resection  1996  . Multiple tooth extractions  3/14    all upper teeth removed  . Esophagogastroduodenoscopy (egd) with  esophageal dilation  ~ 2005  . Laparoscopic cholecystectomy  09/07/2013  . Inguinal hernia repair  1985  . Joint replacement    . Cholecystectomy N/A 09/07/2013    Procedure: LAPAROSCOPIC CHOLECYSTECTOMY ;  Surgeon: Shelly Rubensteinouglas A Blackman, MD;  Location: [facility] OR;  Service: General;  Laterality: N/A;   Family History  Problem Relation Age of Onset  . Tremor Maternal Grandfather    History  Substance Use Topics  . Smoking status: Former Smoker -- 4 years    Types: Cigarettes  . Smokeless tobacco: Never Used     Comment: quit ismoking n 1965  . Alcohol Use: No    Review of Systems  Constitutional: Negative for fever and chills.  Respiratory: Positive for cough and shortness of breath.   Cardiovascular: Negative for chest pain, palpitations and leg swelling.  Gastrointestinal: Negative for nausea, vomiting and abdominal pain.  Musculoskeletal: Negative for back pain, neck pain and neck stiffness.  Skin: Positive for wound. Negative for rash.  Neurological: Positive for speech difficulty. Negative for dizziness, light-headedness, numbness and headaches.  All other systems reviewed and are negative.     Allergies  Ace inhibitors and Penicillins  Home Medications   Prior to Admission medications   Medication Sig Start Date End Date Taking? Authorizing Provider  ascorbic acid (VITAMIN C) 1000 MG tablet Take 1,000 mg by mouth daily.  Historical Provider, MD  aspirin 325 MG tablet Take 325 mg by mouth daily.    Historical Provider, MD  baclofen (LIORESAL) 10 MG tablet Take 1 tablet (10 mg total) by mouth at bedtime. 08/09/13   Alysia Penna, MD  BYSTOLIC 10 MG tablet Take 10 mg by mouth daily.  04/10/13   Historical Provider, MD  Calcium Carbonate-Vitamin D (CALCIUM + D PO) Take 1 tablet by mouth daily.     Historical Provider, MD  cholecalciferol (VITAMIN D) 1000 UNITS tablet Take 1,000 Units by mouth daily.      Historical Provider, MD  clonazePAM (KLONOPIN) 0.5 MG tablet Take 1 tablet  (0.5 mg total) by mouth 2 (two) times daily as needed (anxiety). 08/09/13   Alysia Penna, MD  cyanocobalamin 500 MCG tablet Take 500 mcg by mouth daily.    Historical Provider, MD  diclofenac sodium (VOLTAREN) 1 % GEL Apply 2 g topically 4 (four) times daily as needed (Pain).    Historical Provider, MD  EPINEPHrine (EPIPEN) 0.3 mg/0.3 mL DEVI Inject 0.3 mg into the muscle once. As needed for anaphylaxis (unknown cause)    Historical Provider, MD  HYDROcodone-acetaminophen (NORCO/VICODIN) 5-325 MG per tablet Take 1-2 tablets by mouth every 4 (four) hours as needed for moderate pain. 08/09/13   Alysia Penna, MD  Hypromellose (GENTEAL) 0.3 % SOLN Place 1 drop into both eyes 3 (three) times daily as needed (dry eyes).    Historical Provider, MD  lidocaine (LIDODERM) 5 % Place 1 patch onto the skin daily as needed (Back pain). Remove & Discard patch within 12 hours or as directed by MD    Historical Provider, MD  meclizine (ANTIVERT) 25 MG tablet Take 25 mg by mouth 3 (three) times daily as needed for dizziness.     Historical Provider, MD  Multiple Vitamin (MULITIVITAMIN WITH MINERALS) TABS Take 1 tablet by mouth daily.      Historical Provider, MD  saccharomyces boulardii (FLORASTOR) 250 MG capsule Take 1 capsule (250 mg total) by mouth 2 (two) times daily. 08/09/13   Alysia Penna, MD   SpO2 99% Physical Exam  Nursing note and vitals reviewed. Constitutional: He is oriented to person, place, and time. He appears well-developed and well-nourished. No distress.  HENT:  Head: Normocephalic and atraumatic.  Mouth/Throat: Oropharynx is clear and moist. No oropharyngeal exudate.  Eyes: EOM are normal. Pupils are equal, round, and reactive to light.  Neck: Normal range of motion. Neck supple.  Cardiovascular: Normal rate and regular rhythm.   Pulmonary/Chest: Effort normal. No respiratory distress. He has no wheezes. He has no rales.  Diminished breath sounds in right base.  Abdominal: Soft. Bowel  sounds are normal. He exhibits no distension and no mass. There is no tenderness. There is no rebound and no guarding.  Well healing surgical for port sites. The port site on the lateral surface of the abdomen with mild protrusion of fatty substance but no definite purulent discharge. There is no redness or induration.  Musculoskeletal: Normal range of motion. He exhibits no edema and no tenderness.  No calf swelling or tenderness.  Neurological: He is alert and oriented to person, place, and time.  Questional slurred speech. No definite facial weakness. 0/5 motor of the right upper and right lower extremity. 5/5 motor of left upper and left lower extremity. Sensation is grossly intact.  Skin: Skin is warm and dry. No rash noted. No erythema.  Psychiatric: He has a normal mood and affect. His behavior is normal.  ED Course  Procedures (including critical care time) Labs Review Labs Reviewed  CBC WITH DIFFERENTIAL  COMPREHENSIVE METABOLIC PANEL  URINALYSIS, ROUTINE W REFLEX MICROSCOPIC  PRO B NATRIURETIC PEPTIDE  TROPONIN I  URINE RAPID DRUG SCREEN (HOSP PERFORMED)  ETHANOL  PROTIME-INR  APTT    Imaging Review No results found.   EKG Interpretation   Date/Time:  Wednesday Sep 27 2013 04:05:36 EDT Ventricular Rate:  87 PR Interval:  167 QRS Duration: 77 QT Interval:  345 QTC Calculation: 415 R Axis:   40 Text Interpretation:  Sinus rhythm Borderline T wave abnormalities  Confirmed by Ranae PalmsYELVERTON  MD, Adisynn Suleiman ([id number]) on 09/27/2013 6:24:38 AM      MDM   Final diagnoses:  None   Patient also has remained stable in the emergency department. He's having high 90s to 100 on room air. He is in no respiratory distress. He has a stable neurologic exam.  Had a long discussion with the patient's wife. She states that he went outside today with the caretaker for about an hour. After coming back in size when he started coughing. Coughing progressed overnight with some mild shortness of  breath. She gave several different cough a little relief. States he now looks much better. She says that his been harder to understand for several days. No new focal weakness or numbness.  Followup with PCP. Return precautions given.     Loren Raceravid Jen Benedict, MD 09/27/13 [phone]

## 2013-09-27 NOTE — ED Notes (Signed)
MD at bedside. 

## 2013-09-27 NOTE — ED Notes (Addendum)
MD at bedside. 

## 2013-09-27 NOTE — Discharge Instructions (Signed)

## 2013-09-29 ENCOUNTER — Encounter (INDEPENDENT_AMBULATORY_CARE_PROVIDER_SITE_OTHER): Payer: Self-pay | Admitting: Surgery

## 2013-09-29 ENCOUNTER — Ambulatory Visit (INDEPENDENT_AMBULATORY_CARE_PROVIDER_SITE_OTHER): Payer: Medicare Other | Admitting: Surgery

## 2013-09-29 VITALS — BP 122/79 | HR 77 | Temp 97.7°F | Resp 18

## 2013-09-29 DIAGNOSIS — Z09 Encounter for follow-up examination after completed treatment for conditions other than malignant neoplasm: Secondary | ICD-10-CM

## 2013-09-29 NOTE — Progress Notes (Signed)
Subjective:     Patient ID: [name], male   DOB: 29-Mar-1936, 78 y.o.   MRN: [id number]  HPI He is here for his first post op visit.  He has been stable with no pain  Review of Systems     Objective:   Physical Exam His incisions are well healed.  I placed silver nitrate on the previous per chole drain site    Assessment:     Stable post op     Plan:      he may resume any activity.  I will see him back as needed.

## 2013-10-09 ENCOUNTER — Emergency Department (HOSPITAL_COMMUNITY): Payer: Medicare Other

## 2013-10-09 ENCOUNTER — Encounter (HOSPITAL_COMMUNITY): Payer: Self-pay | Admitting: Emergency Medicine

## 2013-10-09 ENCOUNTER — Inpatient Hospital Stay (HOSPITAL_COMMUNITY)
Admission: EM | Admit: 2013-10-09 | Discharge: 2013-10-13 | DRG: 884 | Disposition: A | Discharge status: Skilled Nursing Facility | Payer: Medicare Other | Attending: Internal Medicine | Admitting: Internal Medicine

## 2013-10-09 DIAGNOSIS — I672 Cerebral atherosclerosis: Secondary | ICD-10-CM | POA: Diagnosis present

## 2013-10-09 DIAGNOSIS — N390 Urinary tract infection, site not specified: Secondary | ICD-10-CM | POA: Diagnosis present

## 2013-10-09 DIAGNOSIS — G8190 Hemiplegia, unspecified affecting unspecified side: Secondary | ICD-10-CM | POA: Diagnosis present

## 2013-10-09 DIAGNOSIS — R4781 Slurred speech: Secondary | ICD-10-CM

## 2013-10-09 DIAGNOSIS — E46 Unspecified protein-calorie malnutrition: Secondary | ICD-10-CM | POA: Diagnosis present

## 2013-10-09 DIAGNOSIS — F0390 Unspecified dementia without behavioral disturbance: Secondary | ICD-10-CM | POA: Diagnosis present

## 2013-10-09 DIAGNOSIS — IMO0002 Reserved for concepts with insufficient information to code with codable children: Secondary | ICD-10-CM

## 2013-10-09 DIAGNOSIS — Z79899 Other long term (current) drug therapy: Secondary | ICD-10-CM

## 2013-10-09 DIAGNOSIS — F0150 Vascular dementia without behavioral disturbance: Principal | ICD-10-CM | POA: Diagnosis present

## 2013-10-09 DIAGNOSIS — Z87820 Personal history of traumatic brain injury: Secondary | ICD-10-CM

## 2013-10-09 DIAGNOSIS — Z87891 Personal history of nicotine dependence: Secondary | ICD-10-CM

## 2013-10-09 DIAGNOSIS — I10 Essential (primary) hypertension: Secondary | ICD-10-CM | POA: Diagnosis present

## 2013-10-09 DIAGNOSIS — S0990XS Unspecified injury of head, sequela: Secondary | ICD-10-CM

## 2013-10-09 DIAGNOSIS — E7800 Pure hypercholesterolemia, unspecified: Secondary | ICD-10-CM | POA: Diagnosis present

## 2013-10-09 DIAGNOSIS — I5032 Chronic diastolic (congestive) heart failure: Secondary | ICD-10-CM | POA: Diagnosis present

## 2013-10-09 DIAGNOSIS — Z96659 Presence of unspecified artificial knee joint: Secondary | ICD-10-CM

## 2013-10-09 DIAGNOSIS — R471 Dysarthria and anarthria: Secondary | ICD-10-CM | POA: Diagnosis present

## 2013-10-09 DIAGNOSIS — R1310 Dysphagia, unspecified: Secondary | ICD-10-CM | POA: Diagnosis present

## 2013-10-09 DIAGNOSIS — I639 Cerebral infarction, unspecified: Secondary | ICD-10-CM

## 2013-10-09 DIAGNOSIS — I509 Heart failure, unspecified: Secondary | ICD-10-CM | POA: Diagnosis present

## 2013-10-09 DIAGNOSIS — G9340 Encephalopathy, unspecified: Secondary | ICD-10-CM | POA: Diagnosis present

## 2013-10-09 DIAGNOSIS — Z7982 Long term (current) use of aspirin: Secondary | ICD-10-CM

## 2013-10-09 DIAGNOSIS — E785 Hyperlipidemia, unspecified: Secondary | ICD-10-CM | POA: Diagnosis present

## 2013-10-09 LAB — I-STAT TROPONIN, ED: Troponin i, poc: 0 ng/mL (ref 0.00–0.08)

## 2013-10-09 LAB — COMPREHENSIVE METABOLIC PANEL
ALBUMIN: 2.9 g/dL — AB (ref 3.5–5.2)
ALK PHOS: 70 U/L (ref 39–117)
ALT: 17 U/L (ref 0–53)
AST: 26 U/L (ref 0–37)
BUN: 12 mg/dL (ref 6–23)
CALCIUM: 9.3 mg/dL (ref 8.4–10.5)
CO2: 22 mEq/L (ref 19–32)
Chloride: 105 mEq/L (ref 96–112)
Creatinine, Ser: 1.03 mg/dL (ref 0.50–1.35)
GFR calc Af Amer: 79 mL/min — ABNORMAL LOW (ref >90)
GFR calc non Af Amer: 68 mL/min — ABNORMAL LOW (ref >90)
Glucose, Bld: 101 mg/dL — ABNORMAL HIGH (ref 70–99)
POTASSIUM: 4.9 meq/L (ref 3.7–5.3)
Sodium: 138 mEq/L (ref 137–147)
Total Bilirubin: 0.5 mg/dL (ref 0.3–1.2)
Total Protein: 6.5 g/dL (ref 6.0–8.3)

## 2013-10-09 LAB — DIFFERENTIAL
BASOS PCT: 1 % (ref 0–1)
Basophils Absolute: 0 10*3/uL (ref 0.0–0.1)
EOS PCT: 3 % (ref 0–5)
Eosinophils Absolute: 0.2 10*3/uL (ref 0.0–0.7)
Lymphocytes Relative: 22 % (ref 12–46)
Lymphs Abs: 1.5 10*3/uL (ref 0.7–4.0)
Monocytes Absolute: 0.7 10*3/uL (ref 0.1–1.0)
Monocytes Relative: 10 % (ref 3–12)
NEUTROS PCT: 64 % (ref 43–77)
Neutro Abs: 4.3 10*3/uL (ref 1.7–7.7)

## 2013-10-09 LAB — CBC
HCT: 41.5 % (ref 39.0–52.0)
Hemoglobin: 13.3 g/dL (ref 13.0–17.0)
MCH: 26.2 pg (ref 26.0–34.0)
MCHC: 32 g/dL (ref 30.0–36.0)
MCV: 81.7 fL (ref 78.0–100.0)
PLATELETS: 336 10*3/uL (ref 150–400)
RBC: 5.08 MIL/uL (ref 4.22–5.81)
RDW: 17.6 % — ABNORMAL HIGH (ref 11.5–15.5)
WBC: 6.7 10*3/uL (ref 4.0–10.5)

## 2013-10-09 LAB — CBG MONITORING, ED: Glucose-Capillary: 86 mg/dL (ref 70–99)

## 2013-10-09 LAB — PROTIME-INR
INR: 1.01 (ref 0.00–1.49)
PROTHROMBIN TIME: 13.1 s (ref 11.6–15.2)

## 2013-10-09 LAB — APTT: aPTT: 62 seconds — ABNORMAL HIGH (ref 24–37)

## 2013-10-09 NOTE — Code Documentation (Signed)
78 yo wm brought in via [facility] for sudden onset slurred speech.  Per wife LKW 1730 and was later found to have garbled speech. Per wife pt was in a horseback riding accident 42 yrs ago resulting in Rt side sensory-motor deficit.  Pt also has hx of Lt eye muscle weakness resulting in nonEOM.  Speech improved some by arrival to ED.  See doc flowsheets for code stroke times.  No a candidate for tPA.

## 2013-10-09 NOTE — Consult Note (Signed)
Neurology Consultation Reason for Consult: dysarthria Referring Physician: otter, O.  CC: dysarthria  History is obtained from:patient, wife  HPI: [name] is a 78 y.o. male with a history of hemiplegia secondary to the wife describes a spinal cord injury, but records described traumatic brain injury with left hemispheric bleed.  Tonight he was at home, and his wife didn't and around 5:30. Later that evening, she checked on himand he was unintelligible. He remained unintelligible and therefore she called EMS for further evaluation. By the time of arrival, he was outside the window for any intervention.   Of note, he has a history of dementia followed by Dr. Vickey Hugerohmeier as well as a history of spinal stenosis who was evaluated by neurosurgery and felt to be a poor candidate.   Of note the the wife as noted that he has had changes in his urine over the past few days. During darker.  Last known well: 5:30 PM TPA given: No outside of window  ROS: unable to obtain due to severe dysarthria  Past Medical History  Diagnosis Date  . Hemiplegia 09/07/1970    "horse fell on him"  . Vertigo   . Hypertension   . TBI (traumatic brain injury) 09/07/1970    WITH LEFT HEMISPHERIC BLEED  . High cholesterol     "not on RX; taken off d/t vertigo fall 2014"  . Chronic pain     lower back "hasn't complained in months" (09/07/2013)  . Dementia   . Swelling of upper lip     tongue  . Weakness generalized 12/22/2012    "not a problem lately" (09/07/2013)  . Dementia with behavioral disturbance 04/18/2013  . Cholecystitis   . H/O hiatal hernia   . Arthritis     "fingers" (09/07/2013)  . Cancer   . Clonus     "RLE" "hadn't bothered him for awhile; at least 6 months" (09/07/2013)  . Familial tremor     Family History: Grandfather-essential tremor  Social History: Tob: denies  Exam: Current vital signs: BP 144/88  Pulse 68  Resp 22  SpO2 98% Vital signs in last 24 hours: Pulse Rate:  [68]  68 (06/01 2316) Resp:  [16-22] 22 (06/01 2316) BP: (133-144)/(87-88) 144/88 mmHg (06/01 2316) SpO2:  [98 %-99 %] 98 % (06/01 2316)  General: in bed, NAD CV: regular rate and rhythm Mental Status: Patient is awake, alert, oriented to person, hospital but not [place], not oriented to month or year Patient is able to identify his wife. He is able to answer questions and follow commands. Cranial Nerves: II: Visual Fields are full. Pupils are equal, round, and reactive to light.  Discs are difficult to visualize. III,IV, VI: he has a deficit in abduction on the left. Full range of motion in the right as per her V: Facial sensation is decreased on the right to touch VII: Facial movement is decreased on right VIII: hearing is intact to voice X: Uvula elevates symmetrically XI: Shoulder shrug is symmetric. XII: tongue deviated to the right.  Motor: Tone is normalon the left, increased on the right. Bulk is normal. 5/5 strength was present on the left. He has 3/5 spastic hemiparesis on the arm with some ability to flex the leg as well.  Sensory: Sensation is diminished throughout the right side Deep Tendon Reflexes: 2+  in the biceps and patellae on the left, brisk on the right Plantars: Toes are down on left, up on right Cerebellar: Mild postural tremor of the  left arm.  Gait: nonambulatory   I have reviewed labs in epic and the results pertinent to this consultation are: cmp - low albumin  I have reviewed the images obtained:CT head- increased atrophy on the left, old cvas  Impression: 78 yo M with previous TBI and new dysarthria, facial droop and tongue deviation. I suspect that he has had a new Majewski brainstem infarct, but unmasking of previous deficit in the setting of acute physiological stressors could also be possible. An MRI would help to confirm a brainstem infarct.   Recommendations: 1. HgbA1c, fasting lipid panel 2. MRI, MRA  of the brain without contrast 3. Frequent  neuro checks 4. Echocardiogram 5. Carotid dopplers 6. Prophylactic therapy-Antiplatelet med: Aspirin - dose 325mg  PO or 300mg  PR 7. Risk factor modification 8. Telemetry monitoring 9. PT consult, OT consult, Speech consult 10. NPO pending speech evaluation.     Ritta Slot, MD [facility] [phone]  If 7pm- 7am, please page neurology on call as listed in AMION.

## 2013-10-09 NOTE — ED Notes (Signed)
Neurology at bedside.

## 2013-10-09 NOTE — ED Notes (Signed)
Last known well time 1730 today ate dinner and talking without difficulty at that time family found pt with difficulty talking around 2200

## 2013-10-10 ENCOUNTER — Inpatient Hospital Stay (HOSPITAL_COMMUNITY): Payer: Medicare Other

## 2013-10-10 DIAGNOSIS — I6350 Cerebral infarction due to unspecified occlusion or stenosis of unspecified cerebral artery: Secondary | ICD-10-CM

## 2013-10-10 DIAGNOSIS — I5032 Chronic diastolic (congestive) heart failure: Secondary | ICD-10-CM

## 2013-10-10 DIAGNOSIS — R1310 Dysphagia, unspecified: Secondary | ICD-10-CM

## 2013-10-10 DIAGNOSIS — G8190 Hemiplegia, unspecified affecting unspecified side: Secondary | ICD-10-CM

## 2013-10-10 DIAGNOSIS — IMO0001 Reserved for inherently not codable concepts without codable children: Secondary | ICD-10-CM

## 2013-10-10 DIAGNOSIS — I379 Nonrheumatic pulmonary valve disorder, unspecified: Secondary | ICD-10-CM

## 2013-10-10 DIAGNOSIS — R4789 Other speech disturbances: Secondary | ICD-10-CM

## 2013-10-10 DIAGNOSIS — I10 Essential (primary) hypertension: Secondary | ICD-10-CM

## 2013-10-10 LAB — URINALYSIS, ROUTINE W REFLEX MICROSCOPIC
Bilirubin Urine: NEGATIVE
Glucose, UA: NEGATIVE mg/dL
HGB URINE DIPSTICK: NEGATIVE
Ketones, ur: NEGATIVE mg/dL
NITRITE: NEGATIVE
PROTEIN: NEGATIVE mg/dL
SPECIFIC GRAVITY, URINE: 1.017 (ref 1.005–1.030)
UROBILINOGEN UA: 0.2 mg/dL (ref 0.0–1.0)
pH: 6 (ref 5.0–8.0)

## 2013-10-10 LAB — CREATININE, SERUM
CREATININE: 0.96 mg/dL (ref 0.50–1.35)
GFR calc Af Amer: >90 mL/min (ref >90)
GFR calc non Af Amer: 79 mL/min — ABNORMAL LOW (ref >90)

## 2013-10-10 LAB — URINE MICROSCOPIC-ADD ON

## 2013-10-10 LAB — CBC
HCT: 40.7 % (ref 39.0–52.0)
Hemoglobin: 12.9 g/dL — ABNORMAL LOW (ref 13.0–17.0)
MCH: 26 pg (ref 26.0–34.0)
MCHC: 31.7 g/dL (ref 30.0–36.0)
MCV: 82.1 fL (ref 78.0–100.0)
PLATELETS: 299 10*3/uL (ref 150–400)
RBC: 4.96 MIL/uL (ref 4.22–5.81)
RDW: 17.9 % — ABNORMAL HIGH (ref 11.5–15.5)
WBC: 5.7 10*3/uL (ref 4.0–10.5)

## 2013-10-10 LAB — HEMOGLOBIN A1C
Hgb A1c MFr Bld: 5.6 % (ref <5.7)
Mean Plasma Glucose: 114 mg/dL (ref <117)

## 2013-10-10 LAB — LIPID PANEL
CHOL/HDL RATIO: 3.4 ratio
CHOLESTEROL: 103 mg/dL (ref 0–200)
HDL: 30 mg/dL — AB (ref >39)
LDL Cholesterol: 50 mg/dL (ref 0–99)
Triglycerides: 116 mg/dL (ref <150)
VLDL: 23 mg/dL (ref 0–40)

## 2013-10-10 MED ORDER — BUDESONIDE-FORMOTEROL FUMARATE 160-4.5 MCG/ACT IN AERO
2.0000 | INHALATION_SPRAY | Freq: Two times a day (BID) | RESPIRATORY_TRACT | Status: DC
  Administered 2013-10-10 – 2013-10-13 (×6): 2 via RESPIRATORY_TRACT
  Filled 2013-10-10 (×2): qty 6

## 2013-10-10 MED ORDER — SODIUM CHLORIDE 0.9 % IV SOLN
INTRAVENOUS | Status: DC
  Administered 2013-10-10 – 2013-10-12 (×2): via INTRAVENOUS

## 2013-10-10 MED ORDER — ALBUTEROL SULFATE (2.5 MG/3ML) 0.083% IN NEBU: 2.5000 mg | INHALATION_SOLUTION | RESPIRATORY_TRACT | Status: DC | PRN

## 2013-10-10 MED ORDER — RESOURCE THICKENUP CLEAR PO POWD
ORAL | Status: DC | PRN
  Filled 2013-10-10: qty 125

## 2013-10-10 MED ORDER — ASPIRIN 325 MG PO TABS
325.0000 mg | ORAL_TABLET | Freq: Every day | ORAL | Status: DC
  Administered 2013-10-10 – 2013-10-13 (×3): 325 mg via ORAL
  Filled 2013-10-10 (×4): qty 1

## 2013-10-10 MED ORDER — LORAZEPAM 2 MG/ML IJ SOLN
1.0000 mg | Freq: Once | INTRAMUSCULAR | Status: AC
  Administered 2013-10-10: 1 mg via INTRAVENOUS
  Filled 2013-10-10: qty 1

## 2013-10-10 MED ORDER — ENOXAPARIN SODIUM 40 MG/0.4ML ~~LOC~~ SOLN
40.0000 mg | Freq: Every day | SUBCUTANEOUS | Status: DC
  Administered 2013-10-10 – 2013-10-13 (×4): 40 mg via SUBCUTANEOUS
  Filled 2013-10-10 (×4): qty 0.4

## 2013-10-10 MED ORDER — ASPIRIN 300 MG RE SUPP
300.0000 mg | Freq: Every day | RECTAL | Status: DC
  Administered 2013-10-12: 300 mg via RECTAL
  Filled 2013-10-10 (×4): qty 1

## 2013-10-10 MED ORDER — LABETALOL HCL 5 MG/ML IV SOLN: 10.0000 mg | INTRAVENOUS | Status: DC | PRN

## 2013-10-10 NOTE — Progress Notes (Signed)
*  PRELIMINARY RESULTS* Vascular Ultrasound Carotid Duplex (Doppler) has been completed.  Preliminary findings: Bilateral:  1-39% ICA stenosis.  Vertebral artery flow is antegrade.      Farrel Demark, RDMS, RVT  10/10/2013, 12:11 PM

## 2013-10-10 NOTE — Progress Notes (Signed)
  Echocardiogram 2D Echocardiogram has been performed.  [name] 10/10/2013, 3:28 PM

## 2013-10-10 NOTE — Evaluation (Signed)
Clinical/Bedside Swallow Evaluation Patient Details  Name: [name] MRN: [id number] Date of Birth: 11-13-1935  Today's Date: 10/10/2013 Time: 0811-0840 SLP Time Calculation (min): 29 min  Past Medical History:  Past Medical History  Diagnosis Date  . Hemiplegia 09/07/1970    "horse fell on him"  . Vertigo   . Hypertension   . TBI (traumatic brain injury) 09/07/1970    WITH LEFT HEMISPHERIC BLEED  . High cholesterol     "not on RX; taken off d/t vertigo fall 2014"  . Chronic pain     lower back "hasn't complained in months" (09/07/2013)  . Dementia   . Swelling of upper lip     tongue  . Weakness generalized 12/22/2012    "not a problem lately" (09/07/2013)  . Dementia with behavioral disturbance 04/18/2013  . Cholecystitis   . H/O hiatal hernia   . Arthritis     "fingers" (09/07/2013)  . Cancer   . Clonus     "RLE" "hadn't bothered him for awhile; at least 6 months" (09/07/2013)  . Familial tremor    Past Surgical History:  Past Surgical History  Procedure Laterality Date  . Back surgery    . Total knee arthroplasty Right ~ 2000  . Posterior laminectomy / decompression lumbar spine  2006  . Colon resection  1996  . Multiple tooth extractions  3/14    all upper teeth removed  . Esophagogastroduodenoscopy (egd) with esophageal dilation  ~ 2005  . Laparoscopic cholecystectomy  09/07/2013  . Inguinal hernia repair  1985  . Joint replacement    . Cholecystectomy N/A 09/07/2013    Procedure: LAPAROSCOPIC CHOLECYSTECTOMY ;  Surgeon: Shelly Rubenstein, MD;  Location: [facility] OR;  Service: General;  Laterality: N/A;   HPI:  78 yo male adm to [facility] with AMS, language deficits last evening - code stroke called.  CT head negative for acute change. PMH + for TBI after falling from horse in 1972.  Pt has h/o esophageal dysphagia s/p dilatation approx 10 years ago. BSE ordered due to pt's h/o dysphagia.  CT head negative for acute changes, CXR negative.   Assessment / Plan /  Recommendation Clinical Impression  Pt with multifactorial dysphagia with cognitive/impulsivity component, oral dysphagia due to weakness/discoordation (pt severely dysarthric and edentulous) and h/o known esophageal deficits.  Suspect baseline dysphagia with vagal, hypoglossal, and facial nerve involvement. Pt is s/p esophageal dilatation approx 10 years ago.  Pt takes very large boluses which will increase his aspiration risk significantly.  Anterior labial spillage on right of liquids noted with poor mastication of solids (no upper dentition).  Pt demonstrated overt coughing with thin liquids concerning for possible aspiration.  Skilled intervention included educating pt to recommendations and modifications-reinforcing effective strategies.    Recommend puree/nectar diet currently due to current admit, dysarthria and dysphagia.  Will follow for education, indication for instrumental evaluation and po tolerance.      Aspiration Risk  Moderate    Diet Recommendation Dysphagia 1 (Puree);Nectar-thick liquid   Liquid Administration via: Cup;Straw Medication Administration: Whole meds with puree Supervision: Patient able to self feed;Full supervision/cueing for compensatory strategies Compensations: Slow rate;Firmin sips/bites;Check for pocketing Postural Changes and/or Swallow Maneuvers: Seated upright 90 degrees;Upright 30-60 min after meal    Other  Recommendations Recommended Consults: MBS (consider MBS if indicated-pt has h/o chronic dysphagia) Oral Care Recommendations: Oral care BID Other Recommendations: Order thickener from pharmacy   Follow Up Recommendations       Frequency and Duration min  2x/week  2 weeks   Pertinent Vitals/Pain Afebrile, decreased CXR negative     Swallow Study Prior Functional Status   see HHX    General Date of Onset: 10/10/13 HPI: 78 [facility] male [facility], language deficits last evening - code stroke called.  CT head negative for acute change.  PMH + for TBI after falling from horse in 1972.  Pt has h/o esophageal dysphagia s/p dilatation approx 10 years ago. BSE ordered due to pt's h/o dysphagia.  Type of Study: Bedside swallow evaluation Previous Swallow Assessment: BSE March 2015, UGI 05/02/2010 moderate hiatal hernia, moderate reflux, tertiary contractions, barium tablet lodged in hiatal hernia - distal narrowing in esophagus Diet Prior to this Study: NPO Temperature Spikes Noted: No Respiratory Status: Room air History of Recent Intubation: No Behavior/Cognition: Alert;Impulsive;Distractible;Decreased sustained attention Oral Cavity - Dentition: Edentulous Self-Feeding Abilities: Able to feed self Patient Positioning: Upright in bed Baseline Vocal Quality: Clear Volitional Cough: Strong Volitional Swallow: Able to elicit    Oral/Motor/Sensory Function Overall Oral Motor/Sensory Function: Impaired at baseline (standing secretions in oral cavity, decreased labial closure on right)   Ice Chips Ice chips: Not tested   Thin Liquid Thin Liquid: Impaired Presentation: Cup;Straw Oral Phase Impairments: Reduced lingual movement/coordination;Impaired anterior to posterior transit;Reduced labial seal Oral Phase Functional Implications: Right anterior spillage Pharyngeal  Phase Impairments: Cough - Immediate;Multiple swallows    Nectar Thick Nectar Thick Liquid: Impaired Presentation: Cup;Straw;Self Fed Oral Phase Impairments: Reduced labial seal;Impaired anterior to posterior transit;Reduced lingual movement/coordination Oral phase functional implications: Right anterior spillage Pharyngeal Phase Impairments: Multiple swallows   Honey Thick Honey Thick Liquid: Not tested   Puree Puree: Impaired Presentation: Self Fed;Spoon Oral Phase Impairments: Reduced lingual movement/coordination;Impaired anterior to posterior transit   Solid   GO    Solid: Impaired Presentation: Self Fed Oral Phase Impairments: Reduced lingual  movement/coordination;Impaired anterior to posterior transit;Impaired mastication;Reduced labial seal Oral Phase Functional Implications: Oral residue Pharyngeal Phase Impairments: Suspected delayed Swallow;Cough - Immediate       [name] 10/10/2013,10:08 AM

## 2013-10-10 NOTE — ED Provider Notes (Signed)
CSN: [id number]     Arrival date & time 10/09/13  2239 History   First MD Initiated Contact with Patient 10/09/13 2256     Chief Complaint  Patient presents with  . Code Stroke    An emergency department physician performed an initial assessment on this suspected stroke patient at 2239. (Consider location/radiation/quality/duration/timing/severity/associated sxs/prior Treatment) HPI 78 year old male presents to the emergency department from home via EMS as a code stroke.  Patient was last known well at around 7:30.  He did with his wife and went into another room to watch TV.  Wife reports around 6:00 she noticed that his speech was slurred to the point were he was almost Rowling.  911 was called around 10 PM when he was not improving.  Patient has multiple medical problems, recently had gallbladder surgery after a prolonged complicated course requiring drainage due to sepsis.  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  He has dementia.  Prior records reviewed, patient was seen in the emergency department on May 20 with slurred speech and shortness of breath, he had slurred speech for 2 days. Past Medical History  Diagnosis Date  . Hemiplegia 09/07/1970    "horse fell on him"  . Vertigo   . Hypertension   . TBI (traumatic brain injury) 09/07/1970    WITH LEFT HEMISPHERIC BLEED  . High cholesterol     "not on RX; taken off d/t vertigo fall 2014"  . Chronic pain     lower back "hasn't complained in months" (09/07/2013)  . Dementia   . Swelling of upper lip     tongue  . Weakness generalized 12/22/2012    "not a problem lately" (09/07/2013)  . Dementia with behavioral disturbance 04/18/2013  . Cholecystitis   . H/O hiatal hernia   . Arthritis     "fingers" (09/07/2013)  . Cancer   . Clonus     "RLE" "hadn't bothered him for awhile; at least 6 months" (09/07/2013)  . Familial tremor    Past Surgical History   Procedure Laterality Date  . Back surgery    . Total knee arthroplasty Right ~ 2000  . Posterior laminectomy / decompression lumbar spine  2006  . Colon resection  1996  . Multiple tooth extractions  3/14    all upper teeth removed  . Esophagogastroduodenoscopy (egd) with esophageal dilation  ~ 2005  . Laparoscopic cholecystectomy  09/07/2013  . Inguinal hernia repair  1985  . Joint replacement    . Cholecystectomy N/A 09/07/2013    Procedure: LAPAROSCOPIC CHOLECYSTECTOMY ;  Surgeon: Shelly Rubenstein, MD;  Location: [facility] OR;  Service: General;  Laterality: N/A;   Family History  Problem Relation Age of Onset  . Tremor Maternal Grandfather    History  Substance Use Topics  . Smoking status: Former Smoker -- 4 years    Types: Cigarettes  . Smokeless tobacco: Never Used     Comment: quit ismoking n 1965  . Alcohol Use: No    Review of Systems  See History of Present Illness; otherwise all other systems are reviewed and negative no    Allergies  Ace inhibitors and Penicillins  Home Medications   Prior to Admission medications   Medication Sig Start Date End Date Taking? Authorizing Provider  albuterol (PROAIR HFA) 108 (90 BASE) MCG/ACT inhaler Inhale 2 puffs into the lungs every 3 (three) hours as needed for wheezing  or shortness of breath.   Yes Historical Provider, MD  ascorbic acid (VITAMIN C) 1000 MG tablet Take 1,000 mg by mouth daily.   Yes Historical Provider, MD  aspirin 325 MG tablet Take 325 mg by mouth daily.   Yes Historical Provider, MD  baclofen (LIORESAL) 10 MG tablet Take 1 tablet (10 mg total) by mouth at bedtime. 08/09/13  Yes Alysia Penna, MD  budesonide-formoterol (SYMBICORT) 160-4.5 MCG/ACT inhaler Inhale 2 puffs into the lungs 2 (two) times daily.   Yes Historical Provider, MD  BYSTOLIC 10 MG tablet Take 10 mg by mouth daily.  04/10/13  Yes Historical Provider, MD  Calcium Carbonate-Vitamin D (CALCIUM + D PO) Take 1 tablet by mouth daily.    Yes Historical  Provider, MD  cholecalciferol (VITAMIN D) 1000 UNITS tablet Take 1,000 Units by mouth daily.     Yes Historical Provider, MD  clonazePAM (KLONOPIN) 0.5 MG tablet Take 1 tablet (0.5 mg total) by mouth 2 (two) times daily as needed (anxiety). 08/09/13  Yes Alysia Penna, MD  cyanocobalamin 500 MCG tablet Take 500 mcg by mouth daily.   Yes Historical Provider, MD  diclofenac sodium (VOLTAREN) 1 % GEL Apply 2 g topically 4 (four) times daily as needed (Pain).   Yes Historical Provider, MD  EPINEPHrine (EPIPEN) 0.3 mg/0.3 mL DEVI Inject 0.3 mg into the muscle once. As needed for anaphylaxis (unknown cause)   Yes Historical Provider, MD  Hypromellose (GENTEAL) 0.3 % SOLN Place 1 drop into both eyes 3 (three) times daily as needed (dry eyes).   Yes Historical Provider, MD  lidocaine (LIDODERM) 5 % Place 1 patch onto the skin daily as needed (Back pain). Remove & Discard patch within 12 hours or as directed by MD   Yes Historical Provider, MD  loratadine (CLARITIN) 10 MG tablet Take 10 mg by mouth daily.   Yes Historical Provider, MD  meclizine (ANTIVERT) 25 MG tablet Take 25 mg by mouth 3 (three) times daily as needed for dizziness.    Yes Historical Provider, MD  MIRTAZAPINE PO Take 1 tablet by mouth daily. Taking for appetite enhancer   Yes Historical Provider, MD  Multiple Vitamin (MULITIVITAMIN WITH MINERALS) TABS Take 1 tablet by mouth daily.     Yes Historical Provider, MD  saccharomyces boulardii (FLORASTOR) 250 MG capsule Take 1 capsule (250 mg total) by mouth 2 (two) times daily. 08/09/13  Yes Alysia Penna, MD  doxycycline (VIBRA-TABS) 100 MG tablet  09/23/13   Historical Provider, MD   BP 134/85  Pulse 69  Resp 21  SpO2 97% Physical Exam  Constitutional: He appears well-developed and well-nourished. No distress.  HENT:  Head: Normocephalic and atraumatic.  Right Ear: External ear normal.  Left Ear: External ear normal.  Nose: Nose normal.  Mouth/Throat: Oropharynx is clear and moist.   Eyes: Conjunctivae are normal. Pupils are equal, round, and reactive to light.  Patient has disconjugate gaze, unable to look laterally with the left eye  Neck: Normal range of motion. Neck supple. No JVD present. No tracheal deviation present. No thyromegaly present.  Cardiovascular: Normal rate, regular rhythm, normal heart sounds and intact distal pulses.  Exam reveals no gallop and no friction rub.   No murmur heard. Pulmonary/Chest: Effort normal and breath sounds normal. No stridor. No respiratory distress. He has no wheezes. He has no rales. He exhibits no tenderness.  Abdominal: Soft. Bowel sounds are normal. He exhibits no distension and no mass. There is no tenderness. There is no rebound  and no guarding.  Musculoskeletal: He exhibits no edema and no tenderness.  Right hemiplegia, clonus in right lower extremity  Lymphadenopathy:    He has no cervical adenopathy.  Neurological: He is alert. He displays abnormal reflex. A cranial nerve deficit is present. He exhibits abnormal muscle tone. Coordination abnormal.  Patient has a slurred speech.  He has no sensation to the right side of his body.  He has disconjugate gaze, per wife this is his baseline due to left eye problems from birth  Skin: Skin is warm and dry. No rash noted. He is not diaphoretic. No erythema. No pallor.    ED Course  Procedures (including critical care time) Labs Review Labs Reviewed  APTT - Abnormal; Notable for the following:    aPTT 62 (*)    All other components within normal limits  CBC - Abnormal; Notable for the following:    RDW 17.6 (*)    All other components within normal limits  COMPREHENSIVE METABOLIC PANEL - Abnormal; Notable for the following:    Glucose, Bld 101 (*)    Albumin 2.9 (*)    GFR calc non Af Amer 68 (*)    GFR calc Af Amer 79 (*)    All other components within normal limits  PROTIME-INR  DIFFERENTIAL  URINALYSIS, ROUTINE W REFLEX MICROSCOPIC  CBG MONITORING, ED  I-STAT  TROPOININ, ED    Imaging Review Ct Head (brain) Wo Contrast  10/09/2013   CLINICAL DATA:  Slurred speech.  EXAM: CT HEAD WITHOUT CONTRAST  TECHNIQUE: Contiguous axial images were obtained from the base of the skull through the vertex without intravenous contrast.  COMPARISON:  09/27/2013  FINDINGS: Ventricles are normal in configuration. There is ventricular and sulcal enlargement reflecting advanced atrophy. There are no parenchymal masses or mass effect. There is an old left thalamic infarct that extends to the posterior left basal ganglia. Patchy white matter hypoattenuation noted consistent with mild to moderate chronic microvascular ischemic change. There is no evidence of a recent cortical infarct.  There are no extra-axial masses or abnormal fluid collections.  There is no intracranial hemorrhage.  Mild sinus mucosal thickening mostly in the right maxillary sinus. Clear mastoid air cells.  IMPRESSION: 1. No acute intracranial abnormalities. 2. Advanced atrophy. Old left thalamic and basal gangliar infarct. Chronic microvascular ischemic change. Critical Value/emergent results were called by telephone at the time of interpretation on 10/09/2013 at 10:57 PM to the attending physician in charge of this patient in the emergence room, who verbally acknowledged these results.   Electronically Signed   By: Amie Portlandavid  Ormond M.D.   On: 10/09/2013 22:57     EKG Interpretation   Date/Time:  Monday October 09 2013 22:55:00 EDT Ventricular Rate:  68 PR Interval:  164 QRS Duration: 86 QT Interval:  380 QTC Calculation: 404 R Axis:   57 Text Interpretation:  Sinus rhythm t waves normalized from prior Confirmed  by Randi Poullard  MD, Milena Liggett ([id number]) on 10/09/2013 11:35:45 PM      MDM   Final diagnoses:  Slurred speech     78 year old male who presented as a code stroke.  He is not a TPA candidate as he is out of the window.  Patient has been seen Dr. Amada JupiterKirkpatrick with neurology.  Recommend admission to the hospital for  stroke workup.  Patient to be admitted to the hospitalist service.      Olivia Mackielga M Prima Rayner, MD 10/10/13 [phone]

## 2013-10-10 NOTE — Progress Notes (Signed)
PT Cancellation Note  Patient Details Name: [name] MRN: [id number] DOB: January 27, 1936   Cancelled Treatment:    Reason Eval/Treat Not Completed: Other (comment) (orders to start 10/11/13.  ).  PT orders start 10/11/13.  PT to follow up tomorrow.    Thanks,    Rollene Rotunda. Shyhiem Beeney, PT, DPT [phone]   10/10/2013, 10:35 AM

## 2013-10-10 NOTE — H&P (Signed)
[facility] History and Physical  MUJTABA BOLLIG ZOX:[id number] DOB: 1935-08-18 DOA: 10/09/2013  Referring physician:  PCP: [name], MD   Chief Complaint: Slurred speech  HPI: [name] is a 78 y.o. 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 Patient was last seen in his usual state health at 5:30 this evening, found by his wife later in the evening to have garbled speech, "not making any sense" with significant difficulties getting his words out. Initially his wife thought this was due to to him being half asleep. At around 10 PM she called 911 as she noted that he had not been improving. Code CVA was called  as initial CT scan of brain did not reveal acute intracranial abnormalities. It did show old left thalamic and basilar ganglier infarct as well as chronic microvascular ischemic change. During my evaluation patient continues to be dysarthric. Patient was seen and evaluated by Dr. Amada Jupiter of neurology in the emergency room. Patient unable to provide history given significant dysarthria. Straight obtained from patient's wife present at bedside and medical record.                                                                                                                                                                                     Review of Systems:  Constitutional:  No weight loss, night sweats, Fevers, chills, fatigue.  HEENT:  No headaches, Difficulty swallowing,Tooth/dental problems,Sore throat,  No sneezing, itching, ear ache, nasal congestion, post nasal drip,  Cardio-vascular:  No chest pain, Orthopnea, PND, swelling in lower extremities, anasarca, dizziness, palpitations  GI:  No heartburn, indigestion, abdominal pain, nausea, vomiting, diarrhea, change in bowel  habits, loss of appetite  Resp:  No shortness of breath with exertion or at rest. No excess mucus, no productive cough, No non-productive cough, No coughing up of blood.No change in color of mucus.No wheezing.No chest wall deformity  Skin:  no rash or lesions.  GU:  no dysuria, change in color of urine, no urgency or frequency. No flank pain.  Musculoskeletal:  No joint pain or swelling. No decreased range of motion. No back pain.  Psych:  No change in mood or affect. No depression or anxiety. No memory loss.   Past Medical History  Diagnosis Date  . Hemiplegia 09/07/1970    "horse fell on him"  . Vertigo   . Hypertension   . TBI (traumatic brain injury) 09/07/1970    WITH LEFT HEMISPHERIC BLEED  . High cholesterol     "  not on RX; taken off d/t vertigo fall 2014"  . Chronic pain     lower back "hasn't complained in months" (09/07/2013)  . Dementia   . Swelling of upper lip     tongue  . Weakness generalized 12/22/2012    "not a problem lately" (09/07/2013)  . Dementia with behavioral disturbance 04/18/2013  . Cholecystitis   . H/O hiatal hernia   . Arthritis     "fingers" (09/07/2013)  . Cancer   . Clonus     "RLE" "hadn't bothered him for awhile; at least 6 months" (09/07/2013)  . Familial tremor    Past Surgical History  Procedure Laterality Date  . Back surgery    . Total knee arthroplasty Right ~ 2000  . Posterior laminectomy / decompression lumbar spine  2006  . Colon resection  1996  . Multiple tooth extractions  3/14    all upper teeth removed  . Esophagogastroduodenoscopy (egd) with esophageal dilation  ~ 2005  . Laparoscopic cholecystectomy  09/07/2013  . Inguinal hernia repair  1985  . Joint replacement    . Cholecystectomy N/A 09/07/2013    Procedure: LAPAROSCOPIC CHOLECYSTECTOMY ;  Surgeon: Shelly Rubensteinouglas A Blackman, MD;  Location: [facility] OR;  Service: General;  Laterality: N/A;   Social History:  reports that he has quit smoking. His smoking use included Cigarettes. He  smoked 0.00 packs per day for 4 years. He has never used smokeless tobacco. He reports that he does not drink alcohol or use illicit drugs.  Allergies  Allergen Reactions  . Ace Inhibitors Swelling  . Penicillins Swelling    Family History  Problem Relation Age of Onset  . Tremor Maternal Grandfather      Prior to Admission medications   Medication Sig Start Date End Date Taking? Authorizing Provider  albuterol (PROAIR HFA) 108 (90 BASE) MCG/ACT inhaler Inhale 2 puffs into the lungs every 3 (three) hours as needed for wheezing or shortness of breath.   Yes Historical Provider, MD  ascorbic acid (VITAMIN C) 1000 MG tablet Take 1,000 mg by mouth daily.   Yes Historical Provider, MD  aspirin 325 MG tablet Take 325 mg by mouth daily.   Yes Historical Provider, MD  baclofen (LIORESAL) 10 MG tablet Take 1 tablet (10 mg total) by mouth at bedtime. 08/09/13  Yes Alysia PennaScott Holwerda, MD  budesonide-formoterol (SYMBICORT) 160-4.5 MCG/ACT inhaler Inhale 2 puffs into the lungs 2 (two) times daily.   Yes Historical Provider, MD  BYSTOLIC 10 MG tablet Take 10 mg by mouth daily.  04/10/13  Yes Historical Provider, MD  Calcium Carbonate-Vitamin D (CALCIUM + D PO) Take 1 tablet by mouth daily.    Yes Historical Provider, MD  cholecalciferol (VITAMIN D) 1000 UNITS tablet Take 1,000 Units by mouth daily.     Yes Historical Provider, MD  clonazePAM (KLONOPIN) 0.5 MG tablet Take 1 tablet (0.5 mg total) by mouth 2 (two) times daily as needed (anxiety). 08/09/13  Yes Alysia PennaScott Holwerda, MD  cyanocobalamin 500 MCG tablet Take 500 mcg by mouth daily.   Yes Historical Provider, MD  diclofenac sodium (VOLTAREN) 1 % GEL Apply 2 g topically 4 (four) times daily as needed (Pain).   Yes Historical Provider, MD  EPINEPHrine (EPIPEN) 0.3 mg/0.3 mL DEVI Inject 0.3 mg into the muscle once. As needed for anaphylaxis (unknown cause)   Yes Historical Provider, MD  Hypromellose (GENTEAL) 0.3 % SOLN Place 1 drop into both eyes 3 (three) times  daily as needed (dry eyes).  Yes Historical Provider, MD  lidocaine (LIDODERM) 5 % Place 1 patch onto the skin daily as needed (Back pain). Remove & Discard patch within 12 hours or as directed by MD   Yes Historical Provider, MD  loratadine (CLARITIN) 10 MG tablet Take 10 mg by mouth daily.   Yes Historical Provider, MD  meclizine (ANTIVERT) 25 MG tablet Take 25 mg by mouth 3 (three) times daily as needed for dizziness.    Yes Historical Provider, MD  MIRTAZAPINE PO Take 1 tablet by mouth daily. Taking for appetite enhancer   Yes Historical Provider, MD  Multiple Vitamin (MULITIVITAMIN WITH MINERALS) TABS Take 1 tablet by mouth daily.     Yes Historical Provider, MD  saccharomyces boulardii (FLORASTOR) 250 MG capsule Take 1 capsule (250 mg total) by mouth 2 (two) times daily. 08/09/13  Yes Alysia Penna, MD  doxycycline (VIBRA-TABS) 100 MG tablet  09/23/13   Historical Provider, MD   Physical Exam: Filed Vitals:   10/10/13 0021  BP: 141/92  Pulse: 68  Resp: 14    BP 141/92  Pulse 68  Resp 14  SpO2 99%  General:  Appears calm and comfortable, dysarthric, however able to follow commands Eyes: PERRL, normal lids, irises & conjunctiva ENT: grossly normal hearing, lips & tongue, dry oral mucosa Neck: no LAD, masses or thyromegaly Cardiovascular: RRR, no m/r/g. No LE edema. Telemetry: SR, no arrhythmias  Respiratory: CTA bilaterally, no w/r/r. Normal respiratory effort. Abdomen: soft, ntnd Skin: no rash or induration seen on limited exam Musculoskeletal: grossly normal tone BUE/BLE Psychiatric: grossly normal mood and affect, speech fluent and appropriate Neurologic: Dysarthric, pupils were equal round reactive to light. There may be mild right-sided facial droop, decreased sensation to right hemi-face. Neck was supple. Right upper extremity 3-5 muscle strength, left upper extremity 5 of 5 muscle strength, right lower extremity 3-5 muscle strength, left lower extremity 5 of 5 muscle  strength, decreased sensation to right side. 2+ bilateral deep tendon reflexes           Labs on Admission:  Basic Metabolic Panel:  Recent Labs Lab 10/09/13 2257  NA 138  K 4.9  CL 105  CO2 22  GLUCOSE 101*  BUN 12  CREATININE 1.03  CALCIUM 9.3   Liver Function Tests:  Recent Labs Lab 10/09/13 2257  AST 26  ALT 17  ALKPHOS 70  BILITOT 0.5  PROT 6.5  ALBUMIN 2.9*   No results found for this basename: LIPASE, AMYLASE,  in the last 168 hours No results found for this basename: AMMONIA,  in the last 168 hours CBC:  Recent Labs Lab 10/09/13 2257  WBC 6.7  NEUTROABS 4.3  HGB 13.3  HCT 41.5  MCV 81.7  PLT 336   Cardiac Enzymes: No results found for this basename: CKTOTAL, CKMB, CKMBINDEX, TROPONINI,  in the last 168 hours  BNP (last 3 results)  Recent Labs  09/27/13 0402  PROBNP 536.7*   CBG:  Recent Labs Lab 10/09/13 2302  GLUCAP 86    Radiological Exams on Admission: Ct Head (brain) Wo Contrast  10/09/2013   CLINICAL DATA:  Slurred speech.  EXAM: CT HEAD WITHOUT CONTRAST  TECHNIQUE: Contiguous axial images were obtained from the base of the skull through the vertex without intravenous contrast.  COMPARISON:  09/27/2013  FINDINGS: Ventricles are normal in configuration. There is ventricular and sulcal enlargement reflecting advanced atrophy. There are no parenchymal masses or mass effect. There is an old left thalamic infarct that extends to the  posterior left basal ganglia. Patchy white matter hypoattenuation noted consistent with mild to moderate chronic microvascular ischemic change. There is no evidence of a recent cortical infarct.  There are no extra-axial masses or abnormal fluid collections.  There is no intracranial hemorrhage.  Mild sinus mucosal thickening mostly in the right maxillary sinus. Clear mastoid air cells.  IMPRESSION: 1. No acute intracranial abnormalities. 2. Advanced atrophy. Old left thalamic and basal gangliar infarct. Chronic  microvascular ischemic change. Critical Value/emergent results were called by telephone at the time of interpretation on 10/09/2013 at 10:57 PM to the attending physician in charge of this patient in the emergence room, who verbally acknowledged these results.   Electronically Signed   By: Amie Portland M.D.   On: 10/09/2013 22:57   Dg Chest Port 1 View  10/10/2013   CLINICAL DATA:  Cough and congestion  EXAM: PORTABLE CHEST - 1 VIEW  COMPARISON:  09/27/2013  FINDINGS: Cardiac silhouette is normal in size. No mediastinal or hilar masses. Mild chronic bronchitic change is noted in the medial lung bases, stable. Lungs are otherwise clear. No pleural effusion or pneumothorax.  Bony thorax is demineralized. Chronic ununited distal right clavicle fracture is stable.  IMPRESSION: No acute cardiopulmonary disease. Stable appearance from the prior study.   Electronically Signed   By: Amie Portland M.D.   On: 10/10/2013 00:09    EKG: Independently reviewed. Sinus rhythm  Assessment/Plan Principal Problem:   CVA (cerebral infarction) Active Problems:   HYPERCHOLESTEROLEMIA, PURE   HYPERTENSION, BENIGN   Hemiparesis due to old head trauma   1. Acute CVA. Patient presenting with significant dysarthria, code CVA called, initial CT scan of brain showing no acute intracranial abnormalities. Patient was seen by Dr. Amada Jupiter of neurology in the emergency room recommended antiplatelet therapy with aspirin. Recommended keeping patient n.p.o. until speech evaluation. Will obtain an MRI/MRI of brain without contrast, transthoracic echocardiogram, Carotid Dopplers,  place patient on telemetry, neuro checks, physical therapy, occupational therapy and speech pathology consultation. 2. Hypertension. Will allow for permissive hypertension given acute CVA to favor cerebral perfusion. Discontinue antihypertensive agents, monitor blood pressures, when necessary labetalol for systolic blood pressures greater than 200 3.   Dyslipidemia. Will check a fasting lipid panel 4. History of traumatic brain injury, secondary to horse riding accident back in the 1970s resulting in right-sided hemiparesis 5. Question urinary tract infection. His wife noted dark urine over the past several days. On presentation he is afebrile with white count within normal range, will obtain a urinalysis.  6. Nutrition. Will keep patient n.p.o. until evaluated by speech pathology. Provide normal saline at 75 mL per hour 7. DVT prophylaxis. Lovenox   Code Status: Full Code Family Communication: Spoke with patient's wife present at bedside Disposition Plan: Admit patient to telemetry, anticipate he will require greater than 2 nights hospitalization  Time spent: 70 min  Jeralyn Bennett [facility] Pager [phone]  **Disclaimer: This note may have been dictated with voice recognition software. Similar sounding words can inadvertently be transcribed and this note may contain transcription errors which may not have been corrected upon publication of note.**

## 2013-10-10 NOTE — Progress Notes (Signed)
Order for MBS received, SLP will complete 10/11/13 in am.  In the interim, recommend continue dys1/nectar.  Thanks for this order. Donavan Burnet, MS [facility] SLP [phone]

## 2013-10-10 NOTE — Progress Notes (Signed)
CARE MANAGEMENT NOTE 10/10/2013  Patient:  [name], [name]   Account Number:  [id number]  Date Initiated:  10/10/2013  Documentation initiated by:  Jiles Crocker  Subjective/Objective Assessment:   ADMITTED WITH STROKE     Action/Plan:   CM FOLLOWING FOR DCP   Anticipated DC Date:  10/16/2013   Anticipated DC Plan:  AWAITING ON PT/OT EVALS FOR DISPOSITION NEEDS    DC Planning Services  CM consult        Status of service:  In process, will continue to follow Medicare Important Message given?   (If response is "NO", the following Medicare IM given date fields will be blank)  Per UR Regulation:  Reviewed for med. necessity/level of care/duration of stay  Comments:  6/2/2015Abelino Derrick RN,BSN,MHA [phone]

## 2013-10-10 NOTE — Progress Notes (Signed)
[facility] PROGRESS NOTE  [name] [name] ZOX:[id number] DOB: 06/10/1935 DOA: 10/09/2013 PCP: Minda MeoARONSON,RICHARD A, MD  Assessment/Plan: Principal Problem:   Slurred speech: Initially thought to be CVA, however MRI normal. Possible this could be TIA, however workup so far negative and patient's symptoms while better than on initial admission, are still persisting. Have a stronger suspicion, but this is likely secondary dehydration possibly from UTI versus aspiration pneumonia versus other. Continue workup. Active Problems:   HYPERCHOLESTEROLEMIA, PURE: Stable.    HYPERTENSION, BENIGN: Blood pressure stable.    Hemiparesis due to old head trauma   Dementia without behavioral disturbance: Patient has history of traumatic brain injury. Otherwise stable.    Dysphagia, unspecified(787.20): See by speech therapy. Patient's diet downgraded to dysphasia 1 pured with nectar thick liquids. He is a previous history of esophageal stenoses requiring stretching. We'll get modified barium swallow to be done 6/3. Results will clarify if esophageal stretching needs to be done again or if this is more pharyngeal in which case this may be more long term.    Chronic diastolic heart failure: Noted on echocardiogram. Check BNP.  Code Status: Full code  Family Communication: Plan discussed with patient's wife at the bedside.  Disposition Plan: Home once final diagnoses/disposition done. Awaiting modified barium swallow results   Consultants:  Neurology  Procedures:  Echocardiogram done 6/2: Grade 1 diastolic dysfunction noted  Carotid Dopplers done 6/2: Preliminary report: No signs of significant carotid artery stenoses bilaterally  Antibiotics:  None  HPI/Subjective: Patient doing okay. Denies any complaints of pain. By his wife, his speech is better that it is still slurred, but he is able to make more coherent words  Objective: Filed Vitals:   10/10/13 1515  BP: 115/67  Pulse: 65  Temp:  97.3 [name] (36.3 C)  Resp: 18    Intake/Output Summary (Last 24 hours) at 10/10/13 1732 Last data filed at 10/10/13 1025  Gross per 24 hour  Intake    120 ml  Output      0 ml  Net    120 ml   Filed Weights   10/10/13 0150  Weight: 82.01 kg (180 lb 12.8 oz)    Exam:   General:  Alert and oriented x2, speech is slurred, but he is able to make words intentionally  Cardiovascular: Regular rate and rhythm, S1-S2, soft 2/6 systolic ejection murmur  Respiratory: Respiratory effort, otherwise clear to auscultation bilaterally  Abdomen: Soft, nontender, nondistended, positive bowel sounds  Musculoskeletal: Trace pitting edema   Data Reviewed: Basic Metabolic Panel:  Recent Labs Lab 10/09/13 2257 10/10/13 0705  NA 138  --   K 4.9  --   CL 105  --   CO2 22  --   GLUCOSE 101*  --   BUN 12  --   CREATININE 1.03 0.96  CALCIUM 9.3  --    Liver Function Tests:  Recent Labs Lab 10/09/13 2257  AST 26  ALT 17  ALKPHOS 70  BILITOT 0.5  PROT 6.5  ALBUMIN 2.9*   No results found for this basename: LIPASE, AMYLASE,  in the last 168 hours No results found for this basename: AMMONIA,  in the last 168 hours CBC:  Recent Labs Lab 10/09/13 2257 10/10/13 0705  WBC 6.7 5.7  NEUTROABS 4.3  --   HGB 13.3 12.9*  HCT 41.5 40.7  MCV 81.7 82.1  PLT 336 299   Cardiac Enzymes: No results found for this basename: CKTOTAL, CKMB, CKMBINDEX, TROPONINI,  in the last  168 hours BNP (last 3 results)  Recent Labs  09/27/13 0402  PROBNP 536.7*   CBG:  Recent Labs Lab 10/09/13 2302  GLUCAP 86    No results found for this or any previous visit (from the past 240 hour(s)).   Studies: Ct Head (brain) Wo Contrast  10/09/2013   CLINICAL DATA:  Slurred speech.  EXAM: CT HEAD WITHOUT CONTRAST  TECHNIQUE: Contiguous axial images were obtained from the base of the skull through the vertex without intravenous contrast.  COMPARISON:  09/27/2013  FINDINGS: Ventricles are normal in  configuration. There is ventricular and sulcal enlargement reflecting advanced atrophy. There are no parenchymal masses or mass effect. There is an old left thalamic infarct that extends to the posterior left basal ganglia. Patchy white matter hypoattenuation noted consistent with mild to moderate chronic microvascular ischemic change. There is no evidence of a recent cortical infarct.  There are no extra-axial masses or abnormal fluid collections.  There is no intracranial hemorrhage.  Mild sinus mucosal thickening mostly in the right maxillary sinus. Clear mastoid air cells.  IMPRESSION: 1. No acute intracranial abnormalities. 2. Advanced atrophy. Old left thalamic and basal gangliar infarct. Chronic microvascular ischemic change. Critical Value/emergent results were called by telephone at the time of interpretation on 10/09/2013 at 10:57 PM to the attending physician in charge of this patient in the emergence room, who verbally acknowledged these results.   Electronically Signed   By: Amie Portland M.D.   On: 10/09/2013 22:57   Mr Brain Wo Contrast  10/10/2013   CLINICAL DATA:  78 year old male with previous traumatic brain injury and new dysarthria, right facial droop, and right tongue deviation.  EXAM: MRI HEAD WITHOUT CONTRAST  MRA HEAD WITHOUT CONTRAST  TECHNIQUE: Multiplanar, multiecho pulse sequences of the brain and surrounding structures were obtained without intravenous contrast. Angiographic images of the head were obtained using MRA technique without contrast.  COMPARISON:  None.  FINDINGS: MRI HEAD FINDINGS  No evidence for acute infarction, hemorrhage, mass lesion, or extra-axial fluid. Hydrocephalus ex vacuo. Chronic microvascular ischemic change.  Large remote left thalamic and posterior putaminal hemorrhage also affecting the centrum semiovale results in significant encephalomalacia, and left-sided Wallerian degeneration of the brainstem. Flow voids are maintained. No other areas of chronic  hemorrhage. No midline shift. Unremarkable pituitary and cerebellar tonsils. No osseous lesions.  MRA HEAD FINDINGS  Internal carotid arteries are widely patent. The left ICA is slightly smaller than the right, secondary to the dominant contribution of both anterior cerebral arteries from the right. Hypoplastic left A1 ACA. No proximal MCA or trifurcation disease. Bilateral fetal PCA origins. Basilar artery widely patent with vertebrals codominant. No intracranial branch occlusion or aneurysm. Slight irregularity distal right vertebral without focal stenosis.  IMPRESSION: Chronic changes as described. Remote left basal ganglia, thalamic, and deep white matter hemorrhagic infarction.  No acute infarct is demonstrated.   Electronically Signed   By: Davonna Belling M.D.   On: 10/10/2013 14:33   Dg Chest Port 1 View  10/10/2013   CLINICAL DATA:  Cough and congestion  EXAM: PORTABLE CHEST - 1 VIEW  COMPARISON:  09/27/2013  FINDINGS: Cardiac silhouette is normal in size. No mediastinal or hilar masses. Mild chronic bronchitic change is noted in the medial lung bases, stable. Lungs are otherwise clear. No pleural effusion or pneumothorax.  Bony thorax is demineralized. Chronic ununited distal right clavicle fracture is stable.  IMPRESSION: No acute cardiopulmonary disease. Stable appearance from the prior study.   Electronically Signed  By: Amie Portland M.D.   On: 10/10/2013 00:09   Mr Maxine Glenn Head/brain Wo Cm  10/10/2013   CLINICAL DATA:  78 year old male with previous traumatic brain injury and new dysarthria, right facial droop, and right tongue deviation.  EXAM: MRI HEAD WITHOUT CONTRAST  MRA HEAD WITHOUT CONTRAST  TECHNIQUE: Multiplanar, multiecho pulse sequences of the brain and surrounding structures were obtained without intravenous contrast. Angiographic images of the head were obtained using MRA technique without contrast.  COMPARISON:  None.  FINDINGS: MRI HEAD FINDINGS  No evidence for acute infarction,  hemorrhage, mass lesion, or extra-axial fluid. Hydrocephalus ex vacuo. Chronic microvascular ischemic change.  Large remote left thalamic and posterior putaminal hemorrhage also affecting the centrum semiovale results in significant encephalomalacia, and left-sided Wallerian degeneration of the brainstem. Flow voids are maintained. No other areas of chronic hemorrhage. No midline shift. Unremarkable pituitary and cerebellar tonsils. No osseous lesions.  MRA HEAD FINDINGS  Internal carotid arteries are widely patent. The left ICA is slightly smaller than the right, secondary to the dominant contribution of both anterior cerebral arteries from the right. Hypoplastic left A1 ACA. No proximal MCA or trifurcation disease. Bilateral fetal PCA origins. Basilar artery widely patent with vertebrals codominant. No intracranial branch occlusion or aneurysm. Slight irregularity distal right vertebral without focal stenosis.  IMPRESSION: Chronic changes as described. Remote left basal ganglia, thalamic, and deep white matter hemorrhagic infarction.  No acute infarct is demonstrated.   Electronically Signed   By: Davonna Belling M.D.   On: 10/10/2013 14:33    Scheduled Meds: . aspirin  300 mg Rectal Daily   Or  . aspirin  325 mg Oral Daily  . budesonide-formoterol  2 puff Inhalation BID  . enoxaparin (LOVENOX) injection  40 mg Subcutaneous Daily   Continuous Infusions: . sodium chloride 75 mL/hr at 10/10/13 1025    Principal Problem:   Slurred speech Active Problems:   HYPERCHOLESTEROLEMIA, PURE   HYPERTENSION, BENIGN   Hemiparesis due to old head trauma   Dementia with behavioral disturbance   Dysphagia, unspecified(787.20)   Chronic diastolic heart failure    Time spent: 25 minutes    Clifton Safley Mordecai Rasmussen  [facility] Pager [phone]. If 7PM-7AM, please contact night-coverage at [email or web address], password [email or web address] 10/10/2013, 5:32 PM  LOS: 1 day

## 2013-10-10 NOTE — Progress Notes (Signed)
BSE completed, full report to follow.  Pt familiar to this SLP from March admission.  Has h/o multifactorial dysphagia s/p esophageal dilatation approx 10 years ago.  Pt is impulsive and takes very large boluses - poor mastication of solids (no upper dentition) and demonstrated overt coughing with thin liquids concerning for possible aspiration. Recommend puree/nectar diet currently due to current admit, dysarthria and dysphagia.     SLP to follow- RN made aware and signs posted above bed. Thanks.  Donavan Burnet, MS [facility] SLP [phone]

## 2013-10-10 NOTE — Progress Notes (Signed)
Stroke Team Progress Note  HISTORY [name] is a 78 y.o. male with a history of hemiplegia secondary to the wife describes a spinal cord injury, but records described traumatic brain injury with left hemispheric bleed.  Tonight 10/09/2013 he was at home, and was last known well at 5:30p. Later that evening, she checked on him and he was unintelligible. He remained unintelligible and therefore she called EMS for further evaluation. By the time of arrival, he was outside the window for any intervention.  Of note, he has a history of dementia followed by Dr. Vickey Huger as well as a history of spinal stenosis who was evaluated by neurosurgery and felt to be a poor candidate.  Of note the the wife as noted that he has had changes in his urine over the past few days, voiding darker.  He was admitted for further evaluation and treatment.  SUBJECTIVE His wife [name] is at the bedside.  Overall he feels his condition is gradually improving. He is now able to say occasional words. He last walked last summer. He was getting HH PT, OT and ST with [facility]. 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 He was comatose for several months. He is an Horticulturist, commercial, Surveyor, mining, lover of civil war history, Dentist.  OBJECTIVE Most recent Vital Signs: Filed Vitals:   10/10/13 0150 10/10/13 0416 10/10/13 0602 10/10/13 0810  BP: 151/87 133/82 116/80 154/77  Pulse: 65 60 63 62  Temp: 97.6 F (36.4 C) 97.4 F (36.3 C) 96.2 F (35.7 C) 98.6 F (37 C)  TempSrc: Oral Oral Axillary Axillary  Resp: 16 16 16 16   Height: 6' (1.829 m)     Weight: 82.01 kg (180 lb 12.8 oz)     SpO2: 97% 97% 98% 96%   CBG (last 3)   Recent Labs  10/09/13 2302  GLUCAP 86    IV Fluid Intake:   . sodium chloride 75 mL/hr at 10/10/13 1025    MEDICATIONS  . aspirin  300 mg Rectal Daily   Or  . aspirin  325 mg Oral Daily  . budesonide-formoterol  2 puff  Inhalation BID  . enoxaparin (LOVENOX) injection  40 mg Subcutaneous Daily   PRN:  albuterol, labetalol, RESOURCE THICKENUP CLEAR  Diet:  Dysphagia 1 nectar thick liquids Activity:  Bedrest DVT Prophylaxis:  Lovenox 40 mg sq daily   CLINICALLY SIGNIFICANT STUDIES Basic Metabolic Panel:   Recent Labs Lab 10/09/13 2257 10/10/13 0705  NA 138  --   K 4.9  --   CL 105  --   CO2 22  --   GLUCOSE 101*  --   BUN 12  --   CREATININE 1.03 0.96  CALCIUM 9.3  --    Liver Function Tests:   Recent Labs Lab 10/09/13 2257  AST 26  ALT 17  ALKPHOS 70  BILITOT 0.5  PROT 6.5  ALBUMIN 2.9*   CBC:   Recent Labs Lab 10/09/13 2257 10/10/13 0705  WBC 6.7 5.7  NEUTROABS 4.3  --   HGB 13.3 12.9*  HCT 41.5 40.7  MCV 81.7 82.1  PLT 336 299   Coagulation:   Recent Labs Lab 10/09/13 2257  LABPROT 13.1  INR 1.01   Cardiac Enzymes: No results found for this basename: CKTOTAL, CKMB, CKMBINDEX, TROPONINI,  in the last 168 hours Urinalysis: No results found for this basename: COLORURINE, APPERANCEUR, LABSPEC,  PHURINE, [place]UCOSEU, HGBUR, [place]ILIRUBINUR, [place]ETONESUR, PROTEINUR, UROBILINOGEN, NITRITE, LEUKOCYTESUR,  in the last 168 hours Lipid Panel    Component Value Date/Time   CHOL 103 10/10/2013 0705   TRIG 116 10/10/2013 0705   HDL 30* 10/10/2013 0705   CHOLHDL 3.4 10/10/2013 0705   VLDL 23 10/10/2013 0705   LDLCALC 50 10/10/2013 0705   HgbA1C  Lab Results  Component Value Date   HGBA1C 5.5 08/06/2013    Urine Drug Screen:     Component Value Date/Time   LABOPIA NONE DETECTED 09/27/2013 0538   COCAINSCRNUR NONE DETECTED 09/27/2013 0538   LABBENZ NONE DETECTED 09/27/2013 0538   AMPHETMU NONE DETECTED 09/27/2013 0538   THCU NONE DETECTED 09/27/2013 0538   LABBARB NONE DETECTED 09/27/2013 0538    Alcohol Level: No results found for this basename: ETH,  in the last 168 hours    CT of the brain  10/09/2013    1. No acute intracranial abnormalities. 2. Advanced atrophy. Old left thalamic and  basal gangliar infarct. Chronic microvascular ischemic change.   MRI of the brain    MRA of the brain    2D Echocardiogram  EF 55-60% with no source of embolus.   Carotid Doppler  No evidence of hemodynamically significant internal carotid artery stenosis. Vertebral artery flow is antegrade.   CXR  10/10/2013    No acute cardiopulmonary disease. Stable appearance from the prior study.  EKG  normal sinus rhythm. For complete results please see formal report.   Therapy Recommendations    Physical Exam   Elderly male not in distress.Awake alert. Afebrile. Head is nontraumatic. Neck is supple without bruit. Hearing is normal. Cardiac exam no murmur or gallop. Lungs are clear to auscultation. Distal pulses are well felt. Neurological Exam : Awake alert oriented x3. Dysarthric speech but can be understood. No aphasia. Fundi were not visualized. Vision acuity seems adequate. Blinks to threat bilaterally. Mild right lower facial weakness. Tongue is midline. Jaw jerk is brisk. Motor system exam reveals mild weakness of right grip, intrinsic hand muscles, right hip and ankle dorsiflexors. Tone is increased bilaterally. Deep tendon reflexes are brisk bilaterally. Left plantar is downgoing right is equivocal. Sensation is diminished on the right compared to the left. Gait was not tested.  ASSESSMENT [name] is a 78 y.o. male presenting with dysarthria.  Imaging pending. Suspect left brain stroke given right sided weakness and pseudobulbar speech. On aspirin 325 mg orally every day prior to admission. Now on aspirin 325 mg orally every day for secondary stroke prevention. Patient with resultant right hemiparesis and pseudobulbar speech. Stroke work up underway.  Hypertension Hyperlipidemia, LDL 50, on no statin PTA, at goal LDL < 100  Hx TBI with left hemisphere bleed 08/1970  Followed by Dr. Vickey Hugerohmeier - Vertigo x 4 years, memory loss  Hospital day # 1  TREATMENT/PLAN  Continue aspirin 325  mg orally every day for secondary stroke prevention.  Follow up MRI  OOB, therapy evals   SIGNED Annie MainSHARON BIBY, MSN, RN, ANVP-BC, ANP-BC, GNP-BC [facility] Pager: [phone] 10/10/2013 5:10 PM   I have personally obtained a history, examined the patient, evaluated imaging results, and formulated the assessment and plan of care. I agree with the above. Delia HeadyPramod Gurshan Settlemire, MD   To contact Stroke Continuity provider, please refer to [email or web address]. After hours, contact General Neurology

## 2013-10-11 ENCOUNTER — Inpatient Hospital Stay (HOSPITAL_COMMUNITY): Payer: Medicare Other

## 2013-10-11 LAB — CBC
HEMATOCRIT: 42.1 % (ref 39.0–52.0)
HEMOGLOBIN: 13.5 g/dL (ref 13.0–17.0)
MCH: 26.2 pg (ref 26.0–34.0)
MCHC: 32.1 g/dL (ref 30.0–36.0)
MCV: 81.7 fL (ref 78.0–100.0)
Platelets: 341 10*3/uL (ref 150–400)
RBC: 5.15 MIL/uL (ref 4.22–5.81)
RDW: 17.5 % — ABNORMAL HIGH (ref 11.5–15.5)
WBC: 7.6 10*3/uL (ref 4.0–10.5)

## 2013-10-11 LAB — BASIC METABOLIC PANEL
BUN: 10 mg/dL (ref 6–23)
CHLORIDE: 108 meq/L (ref 96–112)
CO2: 24 mEq/L (ref 19–32)
CREATININE: 0.98 mg/dL (ref 0.50–1.35)
Calcium: 9 mg/dL (ref 8.4–10.5)
GFR calc non Af Amer: 78 mL/min — ABNORMAL LOW (ref >90)
Glucose, Bld: 102 mg/dL — ABNORMAL HIGH (ref 70–99)
POTASSIUM: 4.1 meq/L (ref 3.7–5.3)
SODIUM: 143 meq/L (ref 137–147)

## 2013-10-11 LAB — PRO B NATRIURETIC PEPTIDE: Pro B Natriuretic peptide (BNP): 337.6 pg/mL (ref 0–450)

## 2013-10-11 MED ORDER — CIPROFLOXACIN IN D5W 400 MG/200ML IV SOLN
400.0000 mg | Freq: Two times a day (BID) | INTRAVENOUS | Status: DC
  Administered 2013-10-11 – 2013-10-12 (×3): 400 mg via INTRAVENOUS
  Filled 2013-10-11 (×5): qty 200

## 2013-10-11 MED ORDER — NEBIVOLOL HCL 10 MG PO TABS
10.0000 mg | ORAL_TABLET | Freq: Every day | ORAL | Status: DC
  Administered 2013-10-11 – 2013-10-13 (×3): 10 mg via ORAL
  Filled 2013-10-11 (×3): qty 1
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
## 2013-10-11 NOTE — Progress Notes (Signed)
Speech Language Pathology Treatment: Dysphagia;Cognitive-Linquistic  Patient Details Name: [name] MRN: [id number] DOB: Dec 04, 1935 Today's Date: 10/11/2013 Time: 6945-0388 SLP Time Calculation (min): 24 min  Assessment / Plan / Recommendation Clinical Impression  Session focused on educating spouse to MBS findings (she was not able to be present for MBS) and recommendations.  Reviewed recommendation to consider Provale bolus flow cup for this impulsive pt to consume thin liquids with least aspiration risk as possible.   Recommend pt be allowed tsps of thin liquids to maximize comfort, hydration and QOL.  Pt's spouse reports pt has been consuming mostly liquids only in the last few weeks due to his oral deficits.  Cough with intake reported prior to admit as well as anterior spillage (noted on eval March 2015).     Observed pt consuming nectar liquids via cup without clinical indications of aspiration.  Xerostomia noted due to open mouth posture that may be compensated for by tsps of thin water.  SLP to continue to follow.    HPI HPI: 78 yo male adm to [facility] with AMS, language deficits last evening - code stroke called.  CT head negative for acute change. PMH + for TBI after falling from horse in 1972.  Pt has h/o esophageal dysphagia s/p dilatation approx 10 years ago. BSE and MBSS, SLE completed.  Follow up visit to educate family.    Pertinent Vitals Afebrile, decreased   SLP Plan  Continue with current plan of care    Recommendations Diet recommendations: Dysphagia 1 (puree);Nectar-thick liquid (tsps of thin between meals recommended) Liquids provided via: No straw Medication Administration: Whole meds with puree Supervision: Patient able to self feed;Full supervision/cueing for compensatory strategies Compensations: Slow rate;Alpern sips/bites;Check for pocketing;Follow solids with liquid Postural Changes and/or Swallow Maneuvers: Seated upright 90 degrees;Upright 30-60 min after meal              Oral Care Recommendations: Oral care BID Follow up Recommendations: Home health SLP Plan: Continue with current plan of care    GO     Donavan Burnet, MS [facility] SLP [phone]
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
## 2013-10-11 NOTE — Progress Notes (Signed)
OT Cancellation Note  Patient Details Name: [name] MRN: [id number] DOB: 24-Jan-1936   Cancelled Treatment:    Reason Eval/Treat Not Completed: Patient at procedure or test/ unavailable. Pt unavailable at this time due to procedure. OT will follow-up with pt to complete evaluation of ADL performance.   Rae Lips [phone] 10/11/2013, 8:27 AM

## 2013-10-11 NOTE — Procedures (Signed)
Objective Swallowing Evaluation: Modified Barium Swallowing Study  Patient Details  Name: [name] MRN: [id number] Date of Birth: 06/18/1935  Today's Date: 10/11/2013 Time: 0825-0849 SLP Time Calculation (min): 24 min  Past Medical History:  Past Medical History  Diagnosis Date  . Hemiplegia 09/07/1970    "horse fell on him"  . Vertigo   . Hypertension   . TBI (traumatic brain injury) 09/07/1970    WITH LEFT HEMISPHERIC BLEED  . High cholesterol     "not on RX; taken off d/t vertigo fall 2014"  . Chronic pain     lower back "hasn't complained in months" (09/07/2013)  . Dementia   . Swelling of upper lip     tongue  . Weakness generalized 12/22/2012    "not a problem lately" (09/07/2013)  . Dementia with behavioral disturbance 04/18/2013  . Cholecystitis   . H/O hiatal hernia   . Arthritis     "fingers" (09/07/2013)  . Cancer   . Clonus     "RLE" "hadn't bothered him for awhile; at least 6 months" (09/07/2013)  . Familial tremor    Past Surgical History:  Past Surgical History  Procedure Laterality Date  . Back surgery    . Total knee arthroplasty Right ~ 2000  . Posterior laminectomy / decompression lumbar spine  2006  . Colon resection  1996  . Multiple tooth extractions  3/14    all upper teeth removed  . Esophagogastroduodenoscopy (egd) with esophageal dilation  ~ 2005  . Laparoscopic cholecystectomy  09/07/2013  . Inguinal hernia repair  1985  . Joint replacement    . Cholecystectomy N/A 09/07/2013    Procedure: LAPAROSCOPIC CHOLECYSTECTOMY ;  Surgeon: Shelly Rubensteinouglas A Blackman, MD;  [facility];  Service: General;  Laterality: N/A;   HPI:  78 yo male adm [facility] with [facility], language deficits last evening - code stroke called.  CT head negative for acute change. PMH + for TBI after falling from horse in 1972.  Pt has h/o esophageal dysphagia s/p dilatation approx 10 years ago. BSE ordered due to pt's h/o dysphagia.      Assessment / Plan / Recommendation Clinical  Impression  Dysphagia Diagnosis: Moderate oral phase dysphagia;Moderate pharyngeal phase dysphagia;Mild cervical esophageal phase dysphagia   Clinical impression:   Moderate oropharyngeal and mild cervical esophageal phase dysphagia.  Pt is impulsive and takes very large boluses resulting in mild silent aspiration of thin liquid as boluses spill into open airway.  Smaller cup sips of thin were not overtly aspirated.  Oral weakness and decreased tongue base retraction contributes to vallecular stasis without pt awareness.  Cued dry "hard" swallow and liquid swallows faciliate clearance.    Using video monitor, SLP provided pt with verbal/visual feedback and reinforced effective compensation strategies.  Pt has a h/o chronic dysphagia and is familiar to this SLP from March hospital admit.    As pt is acutely ill, recommend to continue modified diet to maximize airway protection.  Follow up SLP at next venue of care to maximize swallow rehab/intake/hydration.       Treatment Recommendation  Therapy as outlined in treatment plan below    Diet Recommendation Dysphagia 1 (Puree);Nectar-thick liquid (? frazier water protocol to maximize hydration/comfort/QOL)   Liquid Administration via: Cup;Straw Medication Administration: Whole meds with puree Supervision: Patient able to self feed;Full supervision/cueing for compensatory strategies Compensations: Slow rate;Whitener sips/bites;Check for pocketing;Follow solids with liquid (intermittent dry swallow) Postural Changes and/or Swallow Maneuvers: Seated upright 90 degrees;Upright 30-60 min  after meal    Other  Recommendations Oral Care Recommendations: Oral care BID Other Recommendations: Order thickener from pharmacy   Follow Up Recommendations       Frequency and Duration min 2x/week  2 weeks     General Date of Onset: 10/10/13 HPI: 78 yo male adm to [facility] with AMS, language deficits last evening - code stroke called.  CT head negative for acute  change. PMH + for TBI after falling from horse in 1972.  Pt has h/o esophageal dysphagia s/p dilatation approx 10 years ago. BSE ordered due to pt's h/o dysphagia.  Type of Study: Modified Barium Swallowing Study Reason for Referral: Objectively evaluate swallowing function Previous Swallow Assessment: BSE March 2015, UGI 05/02/2010 moderate hiatal hernia, moderate reflux, tertiary contractions, barium tablet lodged in hiatal hernia - distal narrowing in esophagus Temperature Spikes Noted: No Respiratory Status: Room air History of Recent Intubation: No Behavior/Cognition: Alert;Impulsive;Distractible;Decreased sustained attention Oral Cavity - Dentition: Edentulous;Adequate natural dentition (lower dentition intact) Oral Motor / Sensory Function: Impaired - see Bedside swallow eval Self-Feeding Abilities: Able to feed self Patient Positioning: Upright in chair Baseline Vocal Quality: Clear Volitional Cough: Strong Volitional Swallow: Able to elicit Anatomy: Within functional limits Pharyngeal Secretions: Not observed secondary MBS    Reason for Referral Objectively evaluate swallowing function   Oral Phase Oral Preparation/Oral Phase Oral Phase: Impaired Oral - Nectar Oral - Nectar Teaspoon: Lingual pumping;Reduced posterior propulsion;Weak lingual manipulation Oral - Nectar Cup: Lingual pumping;Reduced posterior propulsion;Piecemeal swallowing;Weak lingual manipulation Oral - Thin Oral - Thin Teaspoon: Lingual pumping;Reduced posterior propulsion;Weak lingual manipulation Oral - Thin Cup: Lingual pumping;Reduced posterior propulsion;Piecemeal swallowing;Weak lingual manipulation Oral - Solids Oral - Puree: Lingual pumping;Reduced posterior propulsion;Piecemeal swallowing;Weak lingual manipulation Oral - Mechanical Soft: Lingual pumping;Reduced posterior propulsion;Impaired mastication;Piecemeal swallowing;Lingual/palatal residue;Weak lingual manipulation;Delayed oral transit    Pharyngeal Phase Pharyngeal Phase Pharyngeal Phase: Impaired Pharyngeal - Nectar Pharyngeal - Nectar Teaspoon: Premature spillage to valleculae;Pharyngeal residue - valleculae;Reduced tongue base retraction Pharyngeal - Nectar Cup: Premature spillage to valleculae;Pharyngeal residue - valleculae;Reduced tongue base retraction Pharyngeal - Thin Pharyngeal - Thin Cup: Premature spillage to valleculae;Trace aspiration;Reduced tongue base retraction;Pharyngeal residue - valleculae Pharyngeal - Thin Straw: Not tested Pharyngeal - Solids Pharyngeal - Puree: Premature spillage to valleculae;Trace aspiration;Reduced tongue base retraction;Pharyngeal residue - valleculae Pharyngeal - Mechanical Soft: Premature spillage to valleculae;Reduced pharyngeal peristalsis;Reduced tongue base retraction;Pharyngeal residue - valleculae Pharyngeal Phase - Comment Pharyngeal Comment: liquid swallows and dry swallows aid pharyngeal clearance, controlling amount of bolus consumed with liquids prevents overt aspiration  Cervical Esophageal Phase    GO    Cervical Esophageal Phase Cervical Esophageal Phase: Impaired Cervical Esophageal Phase - Nectar Nectar Teaspoon: Reduced cricopharyngeal relaxation Nectar Cup: Reduced cricopharyngeal relaxation Cervical Esophageal Phase - Thin Thin Teaspoon: Reduced cricopharyngeal relaxation Thin Cup: Reduced cricopharyngeal relaxation Cervical Esophageal Phase - Solids Puree: Prominent cricopharyngeal segment;Reduced cricopharyngeal relaxation Mechanical Soft: Reduced cricopharyngeal relaxation Cervical Esophageal Phase - Comment Cervical Esophageal Comment: appearance of mildly slow clearance below UES - most notably with pudding/cereal bar that cleared with liquid intake, remainder of esophagus appeared to clear well - radiologist not present to confirm findings         Mills Koller, MS [facility] SLP [phone]

## 2013-10-11 NOTE — Evaluation (Signed)
Physical Therapy Evaluation Patient Details Name: [name] MRN: [id number] DOB: [birth year] Today's Date: 10/11/2013   History of Present Illness   [name] is a 78 y.o. 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 Patient was last seen in his usual state health at 5:30 this evening, found by his wife later in the evening to have garbled speech, "not making any sense" with significant difficulties getting his words out. Initially his wife thought this was due to to him being half asleep. At around 10 PM she called 911 as she noted that he had not been improving. Code CVA was called  as initial CT scan of brain did not reveal acute intracranial abnormalities. It did show old left thalamic and basilar ganglier infarct as well as chronic microvascular ischemic change. During my evaluation patient continues to be dysarthric. Patient was seen and evaluated by Dr. Amada Jupiter of neurology in the emergency room. Patient unable to provide history given significant dysarthria. Straight obtained from patient's wife present at bedside and medical record.      Clinical Impression  Pt admitted with/for slurred speech and "no making sense". MRI does not show acute infarct.   Pt currently limited functionally due to the problems listed. ( See problems list.)   Pt will benefit from PT to maximize function and safety in order to get ready for next venue listed below.     Follow Up Recommendations SNF    Equipment Recommendations  Other (comment);None recommended by PT (TBA)    Recommendations for Other Services       Precautions / Restrictions Precautions Precautions: Fall      Mobility  Bed Mobility Overal bed mobility: Needs Assistance Bed Mobility: Rolling;Supine to Sit;Sit to Supine Rolling: Mod assist    Supine to sit: Max assist Sit to supine: Mod assist;+2 for physical assistance   General bed mobility comments: cues for sequencing; truncal support and assist  Transfers Overall transfer level: Needs assistance Equipment used: 1 person hand held assist (chair back) Transfers: Sit to/from [employer] Transfers Sit to Stand: Mod assist (times 3) Stand pivot transfers: Mod assist;+2 safety/equipment       General transfer comment: cues for hand placement; assist to support R LE and assist coming forward and up.  Ambulation/Gait                Stairs            Wheelchair Mobility    Modified Rankin (Stroke Patients Only)       Balance Overall balance assessment: Needs assistance Sitting-balance support: Feet supported;No upper extremity supported Sitting balance-Leahy Scale: Fair Sitting balance - Comments: does not accept challenge   Standing balance support: Bilateral upper extremity supported;During functional activity Standing balance-Leahy Scale: Poor Standing balance comment: have to heavily support R LE in an optimal biomechanical position                             Pertinent Vitals/Pain     Home Living Family/patient expects to be discharged to:: Other (Comment) (If can work out getting further therapy at [employer], wife wants S) Living Arrangements: Spouse/significant other Available Help at Discharge: Family Type of Home: House Home Access: Ramped entrance     Home Layout: One level Home Equipment:  Transport chair;Other (comment);Bedside commode;Walker - 2 wheels Additional Comments: has platform RW    Prior Function Level of Independence: Needs assistance   Gait / Transfers Assistance Needed: Pt performs SPT with MOD A of wife.  I with bed mobility until 1 week ago.     Comments: pt has been at [facility] 6 weeks total before and after gall bladder sx.     Hand Dominance   Dominant Hand:  (DNT)    Extremity/Trunk  Assessment               Lower Extremity Assessment: Generalized weakness;RLE deficits/detail;LLE deficits/detail RLE Deficits / Details: generally weak with painful arthritic knee  grossly >=3+/5 LLE Deficits / Details: hemiparetic moves grossly in synergy with some isolation of movement.  R LE rotated externally and pt can not easily coordinate good assist with L Le     Communication   Communication: HOH  Cognition Arousal/Alertness: Awake/alert Behavior During Therapy: WFL for tasks assessed/performed Overall Cognitive Status: Within Functional Limits for tasks assessed                      General Comments      Exercises        Assessment/Plan    PT Assessment Patient needs continued PT services  PT Diagnosis Generalized weakness;Acute pain (long term hemiparesis R side)   PT Problem List Decreased strength;Decreased range of motion;Decreased activity tolerance;Decreased balance;Decreased mobility;Decreased coordination;Decreased knowledge of use of DME;Decreased safety awareness;Decreased knowledge of precautions;Pain  PT Treatment Interventions DME instruction;Functional mobility training;Therapeutic activities;Therapeutic exercise;Balance training;Patient/family education   PT Goals (Current goals can be found in the Care Plan section) Acute Rehab PT Goals Patient Stated Goal: Be able to ultimately help wife out enough to go home safely PT Goal Formulation: With patient/family Time For Goal Achievement: 10/18/13 Potential to Achieve Goals: Good    Frequency Min 3X/week   Barriers to discharge        Co-evaluation               End of Session   Activity Tolerance: Patient limited by fatigue;Patient tolerated treatment well;Patient limited by pain Patient left: in bed;with call bell/phone within reach;with family/visitor present Nurse Communication: Mobility status         Time: [id number] PT Time Calculation (min): 40 min   Charges:    PT Evaluation $Initial PT Evaluation Tier I: 1 Procedure PT Treatments $Therapeutic Activity: 23-37 mins   PT G CodesLonia Skinner:          Bobi Daudelin V 10/11/2013, 4:29 PM 10/11/2013  [place] BingKen Sybel Standish, PT [phone]  (pager)

## 2013-10-11 NOTE — Progress Notes (Signed)
Stroke Team Progress Note  HISTORY [name] is a 78 y.o. male with a history of hemiplegia secondary to the wife describes a spinal cord injury, but records described traumatic brain injury with left hemispheric bleed.  Tonight 10/09/2013 he was at home, and was last known well at 5:30p. Later that evening, she checked on him and he was unintelligible. He remained unintelligible and therefore she called EMS for further evaluation. By the time of arrival, he was outside the window for any intervention.  Of note, he has a history of dementia followed by [name] as well as a history of spinal stenosis who was evaluated by neurosurgery and felt to be a poor candidate.  Of note the the wife as noted that he has had changes in his urine over the past few days, voiding darker.  He was admitted for further evaluation and treatment.  SUBJECTIVE His wife is not present at the bedside this am.  OBJECTIVE Most recent Vital Signs: Filed Vitals:   10/10/13 2125 10/11/13 0136 10/11/13 0556 10/11/13 0917  BP: 165/71 138/82 163/97   Pulse: 79 83 87   Temp: 97.6 F (36.4 C) 98.5 F (36.9 C)    TempSrc: Oral Oral    Resp: 18 20    Height:      Weight:      SpO2: 97% 96% 100% 94%   CBG (last 3)   Recent Labs  10/09/13 2302  GLUCAP 86    IV Fluid Intake:   . sodium chloride 75 mL/hr at 10/10/13 2341    MEDICATIONS  . aspirin  300 mg Rectal Daily   Or  . aspirin  325 mg Oral Daily  . budesonide-formoterol  2 puff Inhalation BID  . ciprofloxacin  400 mg Intravenous Q12H  . enoxaparin (LOVENOX) injection  40 mg Subcutaneous Daily  . nebivolol  10 mg Oral Daily   PRN:  albuterol, labetalol, RESOURCE THICKENUP CLEAR  Diet:  Dysphagia 1 nectar thick liquids Activity:  Up with assistance DVT Prophylaxis:  Lovenox 40 mg sq daily   CLINICALLY SIGNIFICANT STUDIES Basic Metabolic Panel:   Recent Labs Lab 10/09/13 2257 10/10/13 0705 10/11/13 0500  NA 138  --  143  K 4.9  --  4.1   CL 105  --  108  CO2 22  --  24  GLUCOSE 101*  --  102*  BUN 12  --  10  CREATININE 1.03 0.96 0.98  CALCIUM 9.3  --  9.0   Liver Function Tests:   Recent Labs Lab 10/09/13 2257  AST 26  ALT 17  ALKPHOS 70  BILITOT 0.5  PROT 6.5  ALBUMIN 2.9*   CBC:   Recent Labs Lab 10/09/13 2257 10/10/13 0705 10/11/13 0500  WBC 6.7 5.7 7.6  NEUTROABS 4.3  --   --   HGB 13.3 12.9* 13.5  HCT 41.5 40.7 42.1  MCV 81.7 82.1 81.7  PLT 336 299 341   Coagulation:   Recent Labs Lab 10/09/13 2257  LABPROT 13.1  INR 1.01   Cardiac Enzymes: No results found for this basename: CKTOTAL, CKMB, CKMBINDEX, TROPONINI,  in the last 168 hours Urinalysis:   Recent Labs Lab 10/10/13 1922  COLORURINE AMBER*  LABSPEC 1.017  PHURINE 6.0  GLUCOSEU NEGATIVE  HGBUR NEGATIVE  BILIRUBINUR NEGATIVE  KETONESUR NEGATIVE  PROTEINUR NEGATIVE  UROBILINOGEN 0.2  NITRITE NEGATIVE  LEUKOCYTESUR [place]*   Lipid Panel    Component Value Date/Time   CHOL 103 10/10/2013 0705  TRIG 116 10/10/2013 0705   HDL 30* 10/10/2013 0705   CHOLHDL 3.4 10/10/2013 0705   VLDL 23 10/10/2013 0705   LDLCALC 50 10/10/2013 0705   HgbA1C  Lab Results  Component Value Date   HGBA1C 5.6 10/10/2013    Urine Drug Screen:     Component Value Date/Time   LABOPIA NONE DETECTED 09/27/2013 0538   COCAINSCRNUR NONE DETECTED 09/27/2013 0538   LABBENZ NONE DETECTED 09/27/2013 0538   AMPHETMU NONE DETECTED 09/27/2013 0538   THCU NONE DETECTED 09/27/2013 0538   LABBARB NONE DETECTED 09/27/2013 0538    Alcohol Level: No results found for this basename: ETH,  in the last 168 hours    CT of the brain  10/09/2013    1. No acute intracranial abnormalities. 2. Advanced atrophy. Old left thalamic and basal gangliar infarct. Chronic microvascular ischemic change.   MRI of the brain  10/10/2013   Remote left basal ganglia, thalamic, and deep white matter hemorrhagic infarction.  No acute infarct is demonstrated.     MRA of the brain  10/10/2013    Chronic changes   2D Echocardiogram  EF 55-60% with no source of embolus.   Carotid Doppler  No evidence of hemodynamically significant internal carotid artery stenosis. Vertebral artery flow is antegrade.   CXR  10/10/2013    No acute cardiopulmonary disease. Stable appearance from the prior study.  EKG  normal sinus rhythm. For complete results please see formal report.   Therapy Recommendations HH ST, ? CIR  Physical Exam   Elderly male not in distress.Awake alert. Afebrile. Head is nontraumatic. Neck is supple without bruit. Hearing is normal. Cardiac exam no murmur or gallop. Lungs are clear to auscultation. Distal pulses are well felt. Neurological Exam : Awake alert oriented x3. Dysarthric speech but can be understood. No aphasia. Fundi were not visualized. Vision acuity seems adequate. Blinks to threat bilaterally. Mild right lower facial weakness. Tongue is midline. Jaw jerk is brisk. Motor system exam reveals mild weakness of right grip, intrinsic hand muscles, right hip and ankle dorsiflexors. Tone is increased bilaterally. Deep tendon reflexes are brisk bilaterally. Left plantar is downgoing right is equivocal. Sensation is diminished on the right compared to the left. Gait was not tested.   ASSESSMENT [name] is a 78 y.o. male presenting with dysarthria.  Imaging revealed no acute stroke. Upon review of imaging, patients brain has been unchanged since 2006 (When R frontal ischemia and L thalamic hemorrhage was present) except for mild atrophy. Dx:  Patient with mixed dementia in the setting of previous vascular injury; no new stroke or stroke related diagnoses.. On aspirin 325 mg orally every day prior to admission. Now on aspirin 325 mg orally every day for secondary stroke prevention. Patient with resultant right hemiparesis and pseudobulbar speech. Stroke work up completed.  Review of patient's extensive prior neurological and brain imaging records going back to 2006 show  stable appearance of the remote hemorrhagic left thalamic and nonhemorrhagic right frontal infarct but there is progressive white matter disease and generalized cerebral atrophy. Patient has had a documented cognitive decline in recent years by Dr. Vickey Huger. 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 He is likely going to need increasing supervision at home and if swallowing difficulties continue even consideration for PEG tube Hypertension Hyperlipidemia, LDL 50, on no statin PTA, at goal LDL < 100  Hx TBI with left hemisphere  bleed 08/1970 and resultant right hemiparesis  Followed by Dr. Vickey Huger - Vertigo x 4 years, memory loss  ? UTI. Placed on empiric abx  Hospital day # 2  TREATMENT/PLAN  Continue aspirin 325 mg orally every day for secondary stroke prevention.  Agree with rehab consult  Nothing further to add from the stroke standpoint, No further stroke workup indicated. Ongoing risk factor control by Primary Care Physician Stroke Service will sign off. Please call should any needs arise. Follow up with Dr. Vickey Huger per standard schedule Discussed with Dr. Jacky Kindle and answered questions  SIGNED Annie Main, MSN, RN, ANVP-BC, ANP-BC, GNP-BC [facility] Pager: [phone] 10/11/2013 9:40 AM   I have personally obtained a history, examined the patient, evaluated imaging results, and formulated the assessment and plan of care. I agree with the above. Delia Heady, MD   To contact Stroke Continuity provider, please refer to [email or web address]. After hours, contact General Neurology

## 2013-10-11 NOTE — Evaluation (Signed)
Speech Language Pathology Evaluation Patient Details Name: [name] MRN: [id number] DOB: [birth year] Today's Date: 10/11/2013 Time: 0177-9390 SLP Time Calculation (min): 34 min  Problem List:  Patient Active Problem List   Diagnosis Date Noted  . Dysphagia, unspecified(787.20) 10/10/2013  . Chronic diastolic heart failure 10/10/2013  . Slurred speech 10/09/2013  . Calculous cholecystitis 09/07/2013  . Cholecystitis 08/04/2013  . Ataxia 04/18/2013  . Dementia without behavioral disturbance 04/18/2013  . Weakness generalized 12/22/2012  . Hemiparesis due to old head trauma 11/30/2012  . HYPERCHOLESTEROLEMIA, PURE 04/09/2008  . HYPERTENSION, BENIGN 04/09/2008  . CHEST PAIN-PRECORDIAL 04/09/2008   Past Medical History:  Past Medical History  Diagnosis Date  . Hemiplegia 09/07/1970    "horse fell on him"  . Vertigo   . Hypertension   . TBI (traumatic brain injury) 09/07/1970    WITH LEFT HEMISPHERIC BLEED  . High cholesterol     "not on RX; taken off d/t vertigo fall 2014"  . Chronic pain     lower back "hasn't complained in months" (09/07/2013)  . Dementia   . Swelling of upper lip     tongue  . Weakness generalized 12/22/2012    "not a problem lately" (09/07/2013)  . Dementia with behavioral disturbance 04/18/2013  . Cholecystitis   . H/O hiatal hernia   . Arthritis     "fingers" (09/07/2013)  . Cancer   . Clonus     "RLE" "hadn't bothered him for awhile; at least 6 months" (09/07/2013)  . Familial tremor    Past Surgical History:  Past Surgical History  Procedure Laterality Date  . Back surgery    . Total knee arthroplasty Right ~ 2000  . Posterior laminectomy / decompression lumbar spine  2006  . Colon resection  1996  . Multiple tooth extractions  3/14    all upper teeth removed  . Esophagogastroduodenoscopy (egd) with esophageal dilation  ~ 2005  . Laparoscopic cholecystectomy  09/07/2013  . Inguinal hernia repair  1985  . Joint replacement    . Cholecystectomy  N/A 09/07/2013    Procedure: LAPAROSCOPIC CHOLECYSTECTOMY ;  Surgeon: Shelly Rubenstein, MD;  Location: [facility] OR;  Service: General;  Laterality: N/A;   HPI:  78 yo male adm to [facility] with AMS, language deficits last evening - code stroke called.  CT head negative for acute change. PMH + for TBI after falling from horse in 1972.  Pt has h/o esophageal dysphagia s/p dilatation approx 10 years ago. BSE and MBS completed, SLE ordered.    Assessment / Plan / Recommendation Clinical Impression  Pt presents with baseline cognitive deficits and dysarthria with acute exacerbation resulting in severely decreased speech intelligibility.  Worsening of dysarthria and swallow impairment noted currently per spouse (who arrived in middle of testing).  Pt was oriented to self, spouse, able to state home address but not oriented to self or situation.  Baseline cognitive deficits may not be acutely worse currently.   Recommend skilled SlP to maximize pt's functional speech with goals to return to premorbid dysarthric level.  Effective compensation strategies reviewed with pt/spouse and used with max cues effectively.  Spouse and pt agreeable.     SLP Assessment  Patient needs continued Speech Lanaguage Pathology Services    Follow Up Recommendations  Home health SLP    Frequency and Duration min 2x/week  2 weeks   Pertinent Vitals/Pain Afebrile, decreased   SLP Goals  SLP Goals Potential to Achieve Goals: Fair Potential Considerations: Ability to learn/carryover  information;Previous level of function;Severity of impairments  SLP Evaluation Prior Functioning  Cognitive/Linguistic Baseline: Baseline deficits Baseline deficit details: pt with baseline cognitive deficits from TBI  many years prior, baseline dysarthria  Lives With: Spouse   Cognition  Orientation Level: Oriented to person;Disoriented to place;Disoriented to time;Disoriented to situation Attention: Sustained;Focused Focused Attention: Appears  intact Sustained Attention: Impaired Sustained Attention Impairment: Verbal basic Memory: Impaired Awareness: Impaired (pt making attempts to get OOB on own, educated to use call bell) Awareness Impairment: Intellectual impairment Problem Solving: Impaired Problem Solving Impairment: Verbal basic Behaviors: Restless;Impulsive Safety/Judgment: Impaired    Comprehension  Auditory Comprehension Overall Auditory Comprehension: Impaired Yes/No Questions: Not tested Commands: Impaired One Step Basic Commands: 75-100% accurate Two Step Basic Commands: 75-100% accurate Conversation: Simple Interfering Components: Attention;Visual impairments;Hearing;Processing speed;Working Radio broadcast assistantmemory EffectiveTechniques: Extra processing time;Increased volume;Repetition Visual Recognition/Discrimination Discrimination: Not tested Reading Comprehension Reading Status: Not tested    Expression Expression Primary Mode of Expression: Verbal Verbal Expression Overall Verbal Expression: Impaired Initiation: No impairment Level of Generative/Spontaneous Verbalization: Word;Phrase;Sentence Repetition: No impairment Naming: Not tested Pragmatics: Impairment Interfering Components: Attention;Speech intelligibility Non-Verbal Means of Communication: Not applicable Written Expression Dominant Hand:  (DNT) Written Expression: Not tested   Oral / Motor Oral Motor/Sensory Function Overall Oral Motor/Sensory Function: Impaired at baseline (standing secretions in oral cavity, decreased labial closure on right) Motor Speech Overall Motor Speech: Impaired Respiration: Impaired Level of Impairment: Word Phonation: Low vocal intensity;Hoarse Articulation: Impaired Level of Impairment: Phrase Intelligibility: Intelligibility reduced Word: 50-74% accurate Phrase: 50-74% accurate Motor Planning: Not tested Motor Speech Errors: Unaware Effective Techniques: Slow rate;Over-articulate;Increased vocal intensity    GO     Mickie Bailamara Ann Annali Lybrand [place], [place] [facility] SLP [phone]

## 2013-10-12 MED ORDER — CIPROFLOXACIN HCL 500 MG PO TABS
500.0000 mg | ORAL_TABLET | Freq: Two times a day (BID) | ORAL | Status: DC
  Administered 2013-10-12 – 2013-10-13 (×2): 500 mg via ORAL
  Filled 2013-10-12 (×5): qty 1

## 2013-10-12 NOTE — Progress Notes (Addendum)
Occupational Therapy Treatment Patient Details Name: [name] MRN: [id number] DOB: 1936/02/09 Today's Date: 10/12/2013    History of present illness [name] is a 78 y.o. male admitted 10/09/13 with c/o slurred speech. 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 Pt has significant dysarthria.    OT comments  Pt seen today for ADLs and therapeutic exercise. Pt with improved speech today and appears more alert. Encouraged pt to use Bil UEs for grooming and feeding activities and educated wife on setup for pt success. May explore adaptive utensils for pt independence with feeding.    Follow Up Recommendations  SNF;Supervision/Assistance - 24 hour    Equipment Recommendations  Other (comment) (Defer to next venue)       Precautions / Restrictions Precautions Precautions: Fall Restrictions Weight Bearing Restrictions: No          Mobility Pt at bed level for OT session this date.     ADL   Eating/Feeding: Set up;Sitting;Minimal assistance   Grooming: Set up;Sitting                                 General ADL Comments: Encouraged pt to performing grooming and feeding tasks for himself. Educated wife on how to setup food on utensil for pt to use. May consider exploring adaptive utensils to increase independence.        Vision                 Additional Comments: Per chart, pt with cognitive decline over past year, likley dementia. This could explain difficulty following directions for visual screening and impaired cognition.           Cognition  Arousal/Alertness: Awake/Alert Behavior During Therapy: WFL for tasks assessed/performed Overall Cognitive Status: History of cognitive impairments - at baseline (per chart, pt with cognitive decline over recent years (likely dementia))                         Exercises General Exercises  - Upper Extremity Shoulder Flexion: AROM;Both;5 reps;Seated Shoulder ABduction: AROM;Both;5 reps;Seated Elbow Flexion: AROM;Both;5 reps;Seated Elbow Extension: AROM;Both;5 reps;Seated Digit Composite Flexion: AROM;Both;5 reps;Seated Composite Extension: AROM;Both;5 reps;Seated           Pertinent Vitals/ Pain       NAD  Home Living Family/patient expects to be discharged to:: Skilled nursing facility Living Arrangements: Spouse/significant other                                          Frequency Min 2X/week     Progress Toward Goals  OT Goals(current goals can now be found in the care plan section)  Progress towards OT goals: Progressing toward goals     Plan Discharge plan remains appropriate       End of Session    Activity Tolerance Patient tolerated treatment well   Patient Left in bed;with call bell/phone within reach;with family/visitor present           Time: 1715-1745 OT Time Calculation (min): 30 min  Charges: OT General Charges $OT Visit: 1 Procedure OT Treatments $Self Care/Home Management : 8-22 mins $Therapeutic Exercise: 8-22 mins  Rae Lips [phone] 10/12/2013, 7:13 PM

## 2013-10-12 NOTE — Progress Notes (Signed)
Physical medicine and rehabilitation consult requested with chart reviewed. Patient with history of traumatic TBI in 1972. Current workup by neurology services shows no new acute changes by MRI essentially unchanged from 2006. Suspect mixed dementia in the setting of previous vascular injury. Documented cognitive decline in recent years by Dr. Vickey Huger. Patient had been at skilled nursing facility in the past and recommendations at this time recommend need for skilled nursing facility. Hold on formal rehabilitation consult at this time proceed with skilled nursing facility

## 2013-10-12 NOTE — Progress Notes (Signed)
Subjective: Awakens, dysarthric  Objective: Vital signs in last 24 hours: Temp:  [97.3 F (36.3 C)-98.6 F (37 C)] 97.3 F (36.3 C) (06/04 1736) Pulse Rate:  [67-80] 73 (06/04 1736) Resp:  [18-20] 20 (06/04 1736) BP: (129-160)/(77-84) 149/84 mmHg (06/04 1736) SpO2:  [96 %-100 %] 100 % (06/04 1736) Weight change:   CBG (last 3)   Recent Labs  10/09/13 2302  GLUCAP 86    Intake/Output from previous day: 06/03 0701 - 06/04 0700 In: 2683.8 [P.O.:240; I.V.:2443.8] Out: 550 [Urine:550]  Physical Exam:  Patient awakens no distress lying supine stable respiratory rate  Eyes are open  No JVD or bruits  Lungs are clear  Cardiovascular regular rate and rhythm no obvious murmur  Abdomen is soft nontender good bowel sounds  Neurologically he is dysarthric a bit confused regarding circumstances difficult to understand right hemiparesis this is baseline for him      Lab Results:  Recent Labs  10/09/13 2257 10/10/13 0705 10/11/13 0500  NA 138  --  143  K 4.9  --  4.1  CL 105  --  108  CO2 22  --  24  GLUCOSE 101*  --  102*  BUN 12  --  10  CREATININE 1.03 0.96 0.98  CALCIUM 9.3  --  9.0    Recent Labs  10/09/13 2257  AST 26  ALT 17  ALKPHOS 70  BILITOT 0.5  PROT 6.5  ALBUMIN 2.9*    Recent Labs  10/09/13 2257 10/10/13 0705 10/11/13 0500  WBC 6.7 5.7 7.6  NEUTROABS 4.3  --   --   HGB 13.3 12.9* 13.5  HCT 41.5 40.7 42.1  MCV 81.7 82.1 81.7  PLT 336 299 341   Lab Results  Component Value Date   INR 1.01 10/09/2013   INR 1.08 09/27/2013   INR 1.30 08/05/2013   No results found for this basename: CKTOTAL, CKMB, CKMBINDEX, TROPONINI,  in the last 72 hours No results found for this basename: TSH, T4TOTAL, FREET3, T3FREE, THYROIDAB,  in the last 72 hours No results found for this basename: VITAMINB12, FOLATE, FERRITIN, TIBC, IRON, RETICCTPCT,  in the last 72 hours  Studies/Results: Dg Swallowing Func-speech Pathology  10/11/2013   Chales Abrahams,  CCC-SLP     10/11/2013  9:38 AM Objective Swallowing Evaluation: Modified Barium Swallowing Study   Patient Details  Name: [name] MRN: [id number] Date of Birth: December 21, 1935  Today's Date: 10/11/2013 Time: 0825-0849 SLP Time Calculation (min): 24 min  Past Medical History:  Past Medical History  Diagnosis Date  . Hemiplegia 09/07/1970    "horse fell on him"  . Vertigo   . Hypertension   . TBI (traumatic brain injury) 09/07/1970    WITH LEFT HEMISPHERIC BLEED  . High cholesterol     "not on RX; taken off d/t vertigo fall 2014"  . Chronic pain     lower back "hasn't complained in months" (09/07/2013)  . Dementia   . Swelling of upper lip     tongue  . Weakness generalized 12/22/2012    "not a problem lately" (09/07/2013)  . Dementia with behavioral disturbance 04/18/2013  . Cholecystitis   . H/O hiatal hernia   . Arthritis     "fingers" (09/07/2013)  . Cancer   . Clonus     "RLE" "hadn't bothered him for awhile; at least 6 months"  (09/07/2013)  . Familial tremor    Past Surgical History:  Past Surgical History  Procedure Laterality Date  .  Back surgery    . Total knee arthroplasty Right ~ 2000  . Posterior laminectomy / decompression lumbar spine  2006  . Colon resection  1996  . Multiple tooth extractions  3/14    all upper teeth removed  . Esophagogastroduodenoscopy (egd) with esophageal dilation  ~  2005  . Laparoscopic cholecystectomy  09/07/2013  . Inguinal hernia repair  1985  . Joint replacement    . Cholecystectomy N/A 09/07/2013    Procedure: LAPAROSCOPIC CHOLECYSTECTOMY ;  Surgeon: Shelly Rubensteinouglas A  Blackman, MD;  [facility];  Service: General;  Laterality:  N/A;   HPI:  78 [facility], language deficits last evening -  code stroke called.  CT head negative for acute change. PMH + for  TBI after falling from horse in 1972.  Pt has h/o esophageal  dysphagia s/p dilatation approx 10 years ago. BSE ordered due to  pt's h/o dysphagia.      Assessment / Plan / Recommendation Clinical Impression  Dysphagia  Diagnosis: Moderate oral phase dysphagia;Moderate  pharyngeal phase dysphagia;Mild cervical esophageal phase  dysphagia   Clinical impression:   Moderate oropharyngeal and mild cervical esophageal phase  dysphagia.  Pt is impulsive and takes very large boluses  resulting in mild silent aspiration of thin liquid as boluses  spill into open airway.  Smaller cup sips of thin were not  overtly aspirated.  Oral weakness and decreased tongue base  retraction contributes to vallecular stasis without pt awareness.   Cued dry "hard" swallow and liquid swallows faciliate clearance.     Using video monitor, SLP provided pt with verbal/visual feedback  and reinforced effective compensation strategies.  Pt has a h/o  chronic dysphagia and is familiar to this SLP from March hospital  admit.    As pt is acutely ill, recommend to continue modified diet to  maximize airway protection.  Follow up SLP at next venue of care  to maximize swallow rehab/intake/hydration.       Treatment Recommendation  Therapy as outlined in treatment plan below    Diet Recommendation Dysphagia 1 (Puree);Nectar-thick liquid (?  frazier water protocol to maximize hydration/comfort/QOL)   Liquid Administration via: Cup;Straw Medication Administration: Whole meds with puree Supervision: Patient able to self feed;Full supervision/cueing  for compensatory strategies Compensations: Slow rate;Athanas sips/bites;Check for  pocketing;Follow solids with liquid (intermittent dry swallow) Postural Changes and/or Swallow Maneuvers: Seated upright 90  degrees;Upright 30-60 min after meal    Other  Recommendations Oral Care Recommendations: Oral care BID Other Recommendations: Order thickener from pharmacy   Follow Up Recommendations       Frequency and Duration min 2x/week  2 weeks     General Date of Onset: 10/10/13 HPI: 78 yo male [facility], language deficits last  evening - code stroke called.  CT head negative for acute change.  PMH + for TBI after falling  from horse in 1972.  Pt has h/o  esophageal dysphagia s/p dilatation approx 10 years ago. BSE  ordered due to pt's h/o dysphagia.  Type of Study: Modified Barium Swallowing Study Reason for Referral: Objectively evaluate swallowing function Previous Swallow Assessment: BSE March 2015, UGI 05/02/2010  moderate hiatal hernia, moderate reflux, tertiary contractions,  barium tablet lodged in hiatal hernia - distal narrowing in  esophagus Temperature Spikes Noted: No Respiratory Status: Room air History of Recent Intubation: No Behavior/Cognition: Alert;Impulsive;Distractible;Decreased  sustained attention Oral Cavity - Dentition: Edentulous;Adequate natural dentition  (lower dentition intact) Oral  Motor / Sensory Function: Impaired - see Bedside swallow  eval Self-Feeding Abilities: Able to feed self Patient Positioning: Upright in chair Baseline Vocal Quality: Clear Volitional Cough: Strong Volitional Swallow: Able to elicit Anatomy: Within functional limits Pharyngeal Secretions: Not observed secondary MBS    Reason for Referral Objectively evaluate swallowing function   Oral Phase Oral Preparation/Oral Phase Oral Phase: Impaired Oral - Nectar Oral - Nectar Teaspoon: Lingual pumping;Reduced posterior  propulsion;Weak lingual manipulation Oral - Nectar Cup: Lingual pumping;Reduced posterior  propulsion;Piecemeal swallowing;Weak lingual manipulation Oral - Thin Oral - Thin Teaspoon: Lingual pumping;Reduced posterior  propulsion;Weak lingual manipulation Oral - Thin Cup: Lingual pumping;Reduced posterior  propulsion;Piecemeal swallowing;Weak lingual manipulation Oral - Solids Oral - Puree: Lingual pumping;Reduced posterior  propulsion;Piecemeal swallowing;Weak lingual manipulation Oral - Mechanical Soft: Lingual pumping;Reduced posterior  propulsion;Impaired mastication;Piecemeal  swallowing;Lingual/palatal residue;Weak lingual  manipulation;Delayed oral transit   Pharyngeal Phase Pharyngeal Phase Pharyngeal Phase:  Impaired Pharyngeal - Nectar Pharyngeal - Nectar Teaspoon: Premature spillage to  valleculae;Pharyngeal residue - valleculae;Reduced tongue base  retraction Pharyngeal - Nectar Cup: Premature spillage to  valleculae;Pharyngeal residue - valleculae;Reduced tongue base  retraction Pharyngeal - Thin Pharyngeal - Thin Cup: Premature spillage to valleculae;Trace  aspiration;Reduced tongue base retraction;Pharyngeal residue -  valleculae Pharyngeal - Thin Straw: Not tested Pharyngeal - Solids Pharyngeal - Puree: Premature spillage to valleculae;Trace  aspiration;Reduced tongue base retraction;Pharyngeal residue -  valleculae Pharyngeal - Mechanical Soft: Premature spillage to  valleculae;Reduced pharyngeal peristalsis;Reduced tongue base  retraction;Pharyngeal residue - valleculae Pharyngeal Phase - Comment Pharyngeal Comment: liquid swallows and dry swallows aid  pharyngeal clearance, controlling amount of bolus consumed with  liquids prevents overt aspiration  Cervical Esophageal Phase    GO    Cervical Esophageal Phase Cervical Esophageal Phase: Impaired Cervical Esophageal Phase - Nectar Nectar Teaspoon: Reduced cricopharyngeal relaxation Nectar Cup: Reduced cricopharyngeal relaxation Cervical Esophageal Phase - Thin Thin Teaspoon: Reduced cricopharyngeal relaxation Thin Cup: Reduced cricopharyngeal relaxation Cervical Esophageal Phase - Solids Puree: Prominent cricopharyngeal segment;Reduced cricopharyngeal  relaxation Mechanical Soft: Reduced cricopharyngeal relaxation Cervical Esophageal Phase - Comment Cervical Esophageal Comment: appearance of mildly slow clearance  below UES - most notably with pudding/cereal bar that cleared  with liquid intake, remainder of esophagus appeared to clear well  - radiologist not present to confirm findings         Mills Koller, MS [facility] SLP [phone]     Assessment/Plan:  #1 dysarthria, right hemiparesis all seemingly related to the chronic changes MRI are  related to his remote traumatic injury albeit he chronic hemorrhagic infarction read-out on MRI will need to be discussed further with Dr. Pearlean Brownie as his brain injury with childhood what appears an MRI I would think they're not remote changes albeit not acute either  #2 acute encephalopathy superimposed on chronic decline related to #1 and possible underlying dementia multifactorial  #3 the issue of UTI has been raised based upon the percent in urine albeit he is not febrile and has normal white count. It does not appear that a urine culture has been sent I will do this and probably start empiric antibiotics  #4 protein calorie malnutrition await a swallow study this morning continue IV fluids  #5 essential hypertension  #6 chronic diastolic heart failure well compensated  As above we'll proceed with a swallow study, urine culture, empiric antibiotics ultimately disposition will become the issue      LOS: 3 days   [name] 10/12/2013, 9:09 PM

## 2013-10-12 NOTE — Progress Notes (Signed)
Speech Language Pathology Treatment: Dysphagia;Cognitive-Linquistic  Patient Details Name: [name] MRN: [id number] DOB: 05/04/1936 Today's Date: 10/12/2013 Time: 1020-1055 SLP Time Calculation (min): 35 min  Assessment / Plan / Recommendation Clinical Impression  Pt with improved mental status today compared to yesterday - he was hollering out for RN to move him off bedpan instead of using call bell just prior to this session. SLP reviewed use of call bell with pt and had him demonstrate - will benefit from frequent repetition for generalization.    Pt continues with severe dysarthria with imprecise articulation and decrease respiratory strength but with max cues will improve speech intelligibility to moderate dysarthric level.  He benefits from consistent reminders and speech output appears effortful for this pt.    SLP observed pt with thin soda via tsp, magic cup bolus and cracker.  Poor mastication ability with cracker noted  with pt taking very large bite- therefore recommend to continue puree diet for institutional feeding and follow up SLP at SNF for dysphagia management in hopes to advance diet.  Tsps of thin liquids appeared to be tolerated well without overt indications of airway compromise.  Pt did cough x1 at end of session which could be pharyngeal stasis but was able to expectorate.  Chronic dysphagia with probable low grade aspiration ongoing per discussion with pt's spouse yesterday but pt appears to be tolerating/managing.    SLP to continue to follow acutely for dysarthria/dysphagia management.    Md please write order for pt to have thin liquids via tsp between meals if you agree.  Thanks.     HPI HPI: 78 yo male adm to [facility] with AMS, language deficits last evening - code stroke called.  CT head negative for acute change. PMH + for TBI after falling from horse in 1972.  Pt has h/o esophageal dysphagia s/p dilatation approx 10 years ago. BSE and MBSS, SLE completed.   Pt with  premorbid dysphagia/dysarthria that has acutely worsened during this hospital stay.     Pertinent Vitals Afebrile, decreased  SLP Plan  Continue with current plan of care    Recommendations Diet recommendations: Dysphagia 1 (puree);Nectar-thick liquid (tsps of thin between meals) Liquids provided via: No straw;Cup;Teaspoon Medication Administration: Whole meds with puree Supervision: Patient able to self feed;Full supervision/cueing for compensatory strategies Compensations: Slow rate;Bour sips/bites;Check for pocketing;Follow solids with liquid Postural Changes and/or Swallow Maneuvers: Seated upright 90 degrees;Upright 30-60 min after meal              Oral Care Recommendations: Oral care BID Follow up Recommendations: Skilled Nursing facility Plan: Continue with current plan of care    GO     Chales Abrahams 10/12/2013, 11:09 AM

## 2013-10-12 NOTE — Progress Notes (Signed)
CM following for DCP; patient is active with [facility] for HHRN/PT/OT/ aide/ ST as prior to admission; Soc Worker working on [employer] placement at this time; Abelino Derrick RN,SN,MHA [phone]

## 2013-10-13 MED ORDER — RESOURCE THICKENUP CLEAR PO POWD: 1.0000 | ORAL | Status: DC | PRN

## 2013-10-13 MED ORDER — HALOPERIDOL LACTATE 5 MG/ML IJ SOLN
1.0000 mg | Freq: Once | INTRAMUSCULAR | Status: AC
  Administered 2013-10-13: 1 mg via INTRAVENOUS
  Filled 2013-10-13: qty 1

## 2013-10-13 MED ORDER — CIPROFLOXACIN HCL 500 MG PO TABS: 500.0000 mg | ORAL_TABLET | Freq: Two times a day (BID) | ORAL | Status: DC

## 2013-10-13 NOTE — Clinical Social Work Note (Signed)
CSW has received SNF authorization from [facility]: [id number]. CSW updated pt's wife at bedside regarding authorization and discharge. Pt to be discharged to [facility] on 10/13/2013. Discharge summary has been faxed to SNF. Discharge packet complete and placed on pt's shadow chart. Pt's wife request [facility] transportation at time of discharge. CSW has arranged for transportation via [facility] at 2:45pm. RN updated regarding information above.  RN to please call report to [facility] at [phone]  Marcelline Deist, MSW, [facility] Licensed Clinical Social Worker [phone] and [phone]

## 2013-10-13 NOTE — Clinical Social Work Placement (Signed)
Clinical Social Work Department CLINICAL SOCIAL WORK PLACEMENT NOTE 10/13/2013  Patient:  [name], [name]  Account Number:  [id number] Admit date:  10/09/2013  Clinical Social Worker:  Mosie Epstein  Date/time:  10/13/2013 11:47 AM  Clinical Social Work is seeking post-discharge placement for this patient at the following level of care:   SKILLED NURSING   (*CSW will update this form in Epic as items are completed)   10/10/2013  Patient/family provided with [facility] Department of Clinical Social Work's list of facilities offering this level of care within the geographic area requested by the patient (or if unable, by the patient's family).  10/10/2013  Patient/family informed of their freedom to choose among providers that offer the needed level of care, that participate in Medicare, Medicaid or managed care program needed by the patient, have an available bed and are willing to accept the patient.  10/10/2013  Patient/family informed of [facility]' ownership interest in [facility], as well as of the fact that they are under no obligation to receive care at this facility.  PASARR submitted to EDS on  PASARR number received from EDS on   FL2 transmitted to all facilities in geographic area requested by pt/family on  10/10/2013 FL2 transmitted to all facilities within larger geographic area on   Patient informed that his/her managed care company has contracts with or will negotiate with  certain facilities, including the following:     Patient/family informed of bed offers received:  10/13/2013 Patient chooses bed at [facility] Physician recommends and patient chooses bed at  [facility]  Patient to be transferred to [facility] on  10/13/2013 Patient to be transferred to facility by [facility]  The following physician request were entered in Epic:   Additional Comments: PASARR previously  existing.  Marcelline Deist, MSW, [facility] Licensed Clinical Social Worker [phone] and [phone]
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
## 2013-10-13 NOTE — Progress Notes (Signed)
Pt. DC'd via transport to [place].  Vital signs and assessment were stable.

## 2013-10-13 NOTE — Progress Notes (Signed)
This morning this RN called pt. Wife because pt was being verbally and physically combative with staff.  MD was called to get order for Haldol.  Order was received.  Will continue to monitor patient.

## 2013-10-13 NOTE — Discharge Summary (Signed)
DISCHARGE SUMMARY  [name]  MR#: [id number]  DOB:April 01, 1936  Date of Admission: 10/09/2013 Date of Discharge: 10/13/2013  Attending Physician:Korinne Greenstein A Jacky Kindle  Patient's GEX:BMWUXLK,GMWNUUV A, MD  Consults: dr. Pearlean Brownie. 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 Active Problems:   HYPERCHOLESTEROLEMIA, PURE   HYPERTENSION, BENIGN   Hemiparesis due to old head trauma   Dementia without behavioral disturbance   Dysphagia, unspecified(787.20)   Chronic diastolic heart failure   Discharge Medications:   Medication List    STOP taking these medications       baclofen 10 MG tablet  Commonly known as:  LIORESAL     CALCIUM + D PO     diclofenac sodium 1 % Gel  Commonly known as:  VOLTAREN     doxycycline 100 MG tablet  Commonly known as:  VIBRA-TABS     EPIPEN 0.3 mg/0.3 mL Devi  Generic drug:  EPINEPHrine     lidocaine 5 %  Commonly known as:  LIDODERM     loratadine 10 MG tablet  Commonly known as:  CLARITIN     meclizine 25 MG tablet  Commonly known as:  ANTIVERT     saccharomyces boulardii 250 MG capsule  Commonly known as:  FLORASTOR      TAKE these medications       ascorbic acid 1000 MG tablet  Commonly known as:  VITAMIN C  Take 1,000 mg by mouth daily.     aspirin 325 MG tablet  Take 325 mg by mouth daily.     budesonide-formoterol 160-4.5 MCG/ACT inhaler  Commonly known as:  SYMBICORT  Inhale 2 puffs into the lungs 2 (two) times daily.     BYSTOLIC 10 MG tablet  Generic drug:  nebivolol  Take 10 mg by mouth daily.     cholecalciferol 1000 UNITS tablet  Commonly known as:  VITAMIN D  Take 1,000 Units by mouth daily.     ciprofloxacin 500 MG tablet  Commonly known as:  CIPRO  Take 1 tablet (500 mg total) by mouth 2 (two) times daily.     clonazePAM 0.5 MG tablet  Commonly known as:  KLONOPIN  Take 1 tablet (0.5 mg total) by  mouth 2 (two) times daily as needed (anxiety).     cyanocobalamin 500 MCG tablet  Take 500 mcg by mouth daily.     GENTEAL 0.3 % Soln  Generic drug:  Hypromellose  Place 1 drop into both eyes 3 (three) times daily as needed (dry eyes).     MIRTAZAPINE PO  Take 1 tablet by mouth daily. Taking for appetite enhancer     multivitamin with minerals Tabs tablet  Take 1 tablet by mouth daily.     PROAIR HFA 108 (90 BASE) MCG/ACT inhaler  Generic drug:  albuterol  Inhale 2 puffs into the lungs every 3 (three) hours as needed for wheezing or shortness of breath.     RESOURCE THICKENUP CLEAR Powd  Take 120 g by mouth as needed.        Hospital Procedures: Ct Head (brain) Wo Contrast  10/09/2013   CLINICAL DATA:  Slurred speech.  EXAM: CT HEAD WITHOUT CONTRAST  TECHNIQUE: Contiguous axial images were obtained from the base of the skull through the vertex without intravenous contrast.  COMPARISON:  09/27/2013  FINDINGS: Ventricles are normal in configuration. There is ventricular and sulcal enlargement reflecting advanced atrophy. There are no  parenchymal masses or mass effect. There is an old left thalamic infarct that extends to the posterior left basal ganglia. Patchy white matter hypoattenuation noted consistent with mild to moderate chronic microvascular ischemic change. There is no evidence of a recent cortical infarct.  There are no extra-axial masses or abnormal fluid collections.  There is no intracranial hemorrhage.  Mild sinus mucosal thickening mostly in the right maxillary sinus. Clear mastoid air cells.  IMPRESSION: 1. No acute intracranial abnormalities. 2. Advanced atrophy. Old left thalamic and basal gangliar infarct. Chronic microvascular ischemic change. Critical Value/emergent results were called by telephone at the time of interpretation on 10/09/2013 at 10:57 PM to the attending physician in charge of this patient in the emergence room, who verbally acknowledged these results.    Electronically Signed   By: Amie Portland M.D.   On: 10/09/2013 22:57   Ct Head Wo Contrast  09/27/2013   CLINICAL DATA:  Slurred speech.  EXAM: CT HEAD WITHOUT CONTRAST  TECHNIQUE: Contiguous axial images were obtained from the base of the skull through the vertex without intravenous contrast.  COMPARISON:  Head CT 08/04/2013.  FINDINGS: Moderate cerebral and cerebellar atrophy. Ex vacuo dilatation of the ventricular system. Patchy and confluent areas of decreased attenuation are noted throughout the deep and periventricular white matter of the cerebral hemispheres bilaterally, compatible with chronic microvascular ischemic disease. No acute intracranial abnormalities. Specifically, no evidence of acute intracranial hemorrhage, no definite findings of acute/subacute cerebral ischemia, no mass, mass effect, hydrocephalus or abnormal intra or extra-axial fluid collections. Visualized paranasal sinuses and mastoids are well pneumatized. No acute displaced skull fractures are identified.  IMPRESSION: 1. No acute intracranial abnormalities. 2. Atrophy and chronic ischemic changes in the cerebral white matter, similar to prior study 08/04/2013, as above.   Electronically Signed   By: Trudie Reed M.D.   On: 09/27/2013 04:43   Mr Brain Wo Contrast  10/10/2013   CLINICAL DATA:  78 year old male with previous traumatic brain injury and new dysarthria, right facial droop, and right tongue deviation.  EXAM: MRI HEAD WITHOUT CONTRAST  MRA HEAD WITHOUT CONTRAST  TECHNIQUE: Multiplanar, multiecho pulse sequences of the brain and surrounding structures were obtained without intravenous contrast. Angiographic images of the head were obtained using MRA technique without contrast.  COMPARISON:  None.  FINDINGS: MRI HEAD FINDINGS  No evidence for acute infarction, hemorrhage, mass lesion, or extra-axial fluid. Hydrocephalus ex vacuo. Chronic microvascular ischemic change.  Large remote left thalamic and posterior putaminal  hemorrhage also affecting the centrum semiovale results in significant encephalomalacia, and left-sided Wallerian degeneration of the brainstem. Flow voids are maintained. No other areas of chronic hemorrhage. No midline shift. Unremarkable pituitary and cerebellar tonsils. No osseous lesions.  MRA HEAD FINDINGS  Internal carotid arteries are widely patent. The left ICA is slightly smaller than the right, secondary to the dominant contribution of both anterior cerebral arteries from the right. Hypoplastic left A1 ACA. No proximal MCA or trifurcation disease. Bilateral fetal PCA origins. Basilar artery widely patent with vertebrals codominant. No intracranial branch occlusion or aneurysm. Slight irregularity distal right vertebral without focal stenosis.  IMPRESSION: Chronic changes as described. Remote left basal ganglia, thalamic, and deep white matter hemorrhagic infarction.  No acute infarct is demonstrated.   Electronically Signed   By: Davonna Belling M.D.   On: 10/10/2013 14:33   Dg Chest Port 1 View  10/10/2013   CLINICAL DATA:  Cough and congestion  EXAM: PORTABLE CHEST - 1 VIEW  COMPARISON:  09/27/2013  FINDINGS: Cardiac silhouette is normal in size. No mediastinal or hilar masses. Mild chronic bronchitic change is noted in the medial lung bases, stable. Lungs are otherwise clear. No pleural effusion or pneumothorax.  Bony thorax is demineralized. Chronic ununited distal right clavicle fracture is stable.  IMPRESSION: No acute cardiopulmonary disease. Stable appearance from the prior study.   Electronically Signed   By: Amie Portland M.D.   On: 10/10/2013 00:09   Dg Chest Port 1 View  09/27/2013   CLINICAL DATA:  Cough.  Shortness of breath.  EXAM: PORTABLE CHEST - 1 VIEW  COMPARISON:  Chest x-ray 08/06/2013.  FINDINGS: Lung volumes are low. There are some bibasilar opacities favored to reflect subsegmental atelectasis. No definite consolidative airspace disease. No pleural effusions. No evidence of  pulmonary edema. Heart size is normal. Mediastinal contours are within normal limits.  IMPRESSION: 1. Low lung volumes without radiographic evidence of acute cardiopulmonary disease.   Electronically Signed   By: Trudie Reed M.D.   On: 09/27/2013 04:29   Dg Swallowing Func-speech Pathology  10/11/2013   Chales Abrahams, CCC-SLP     10/11/2013  9:38 AM Objective Swallowing Evaluation: Modified Barium Swallowing Study   Patient Details  Name: [name] MRN: [id number] Date of Birth: [birth year]  Today's Date: 10/11/2013 Time: 0825-0849 SLP Time Calculation (min): 24 min  Past Medical History:  Past Medical History  Diagnosis Date  . Hemiplegia 09/07/1970    "horse fell on him"  . Vertigo   . Hypertension   . TBI (traumatic brain injury) 09/07/1970    WITH LEFT HEMISPHERIC BLEED  . High cholesterol     "not on RX; taken off d/t vertigo fall 2014"  . Chronic pain     lower back "hasn't complained in months" (09/07/2013)  . Dementia   . Swelling of upper lip     tongue  . Weakness generalized 12/22/2012    "not a problem lately" (09/07/2013)  . Dementia with behavioral disturbance 04/18/2013  . Cholecystitis   . H/O hiatal hernia   . Arthritis     "fingers" (09/07/2013)  . Cancer   . Clonus     "RLE" "hadn't bothered him for awhile; at least 6 months"  (09/07/2013)  . Familial tremor    Past Surgical History:  Past Surgical History  Procedure Laterality Date  . Back surgery    . Total knee arthroplasty Right ~ 2000  . Posterior laminectomy / decompression lumbar spine  2006  . Colon resection  1996  . Multiple tooth extractions  3/14    all upper teeth removed  . Esophagogastroduodenoscopy (egd) with esophageal dilation  ~  2005  . Laparoscopic cholecystectomy  09/07/2013  . Inguinal hernia repair  1985  . Joint replacement    . Cholecystectomy N/A 09/07/2013    Procedure: LAPAROSCOPIC CHOLECYSTECTOMY ;  Surgeon: Shelly Rubenstein, MD;  Location: [facility] OR;  Service: General;  Laterality:  N/A;   HPI:  78 yo male adm to [facility]  with AMS, language deficits last evening -  code stroke called.  CT head negative for acute change. PMH + for  TBI after falling from horse in 1972.  Pt has h/o esophageal  dysphagia s/p dilatation approx 10 years ago. BSE ordered due to  pt's h/o dysphagia.      Assessment / Plan / Recommendation Clinical Impression  Dysphagia Diagnosis: Moderate oral phase dysphagia;Moderate  pharyngeal phase dysphagia;Mild cervical esophageal phase  dysphagia   Clinical impression:  Moderate oropharyngeal and mild cervical esophageal phase  dysphagia.  Pt is impulsive and takes very large boluses  resulting in mild silent aspiration of thin liquid as boluses  spill into open airway.  Smaller cup sips of thin were not  overtly aspirated.  Oral weakness and decreased tongue base  retraction contributes to vallecular stasis without pt awareness.   Cued dry "hard" swallow and liquid swallows faciliate clearance.     Using video monitor, SLP provided pt with verbal/visual feedback  and reinforced effective compensation strategies.  Pt has a h/o  chronic dysphagia and is familiar to this SLP from March hospital  admit.    As pt is acutely ill, recommend to continue modified diet to  maximize airway protection.  Follow up SLP at next venue of care  to maximize swallow rehab/intake/hydration.       Treatment Recommendation  Therapy as outlined in treatment plan below    Diet Recommendation Dysphagia 1 (Puree);Nectar-thick liquid (?  frazier water protocol to maximize hydration/comfort/QOL)   Liquid Administration via: Cup;Straw Medication Administration: Whole meds with puree Supervision: Patient able to self feed;Full supervision/cueing  for compensatory strategies Compensations: Slow rate;Harnois sips/bites;Check for  pocketing;Follow solids with liquid (intermittent dry swallow) Postural Changes and/or Swallow Maneuvers: Seated upright 90  degrees;Upright 30-60 min after meal    Other  Recommendations Oral Care Recommendations: Oral  care BID Other Recommendations: Order thickener from pharmacy   Follow Up Recommendations       Frequency and Duration min 2x/week  2 weeks     General Date of Onset: 10/10/13 HPI: 78 yo male adm to [facility] with AMS, language deficits last  evening - code stroke called.  CT head negative for acute change.  PMH + for TBI after falling from horse in 1972.  Pt has h/o  esophageal dysphagia s/p dilatation approx 10 years ago. BSE  ordered due to pt's h/o dysphagia.  Type of Study: Modified Barium Swallowing Study Reason for Referral: Objectively evaluate swallowing function Previous Swallow Assessment: BSE March 2015, UGI 05/02/2010  moderate hiatal hernia, moderate reflux, tertiary contractions,  barium tablet lodged in hiatal hernia - distal narrowing in  esophagus Temperature Spikes Noted: No Respiratory Status: Room air History of Recent Intubation: No Behavior/Cognition: Alert;Impulsive;Distractible;Decreased  sustained attention Oral Cavity - Dentition: Edentulous;Adequate natural dentition  (lower dentition intact) Oral Motor / Sensory Function: Impaired - see Bedside swallow  eval Self-Feeding Abilities: Able to feed self Patient Positioning: Upright in chair Baseline Vocal Quality: Clear Volitional Cough: Strong Volitional Swallow: Able to elicit Anatomy: Within functional limits Pharyngeal Secretions: Not observed secondary MBS    Reason for Referral Objectively evaluate swallowing function   Oral Phase Oral Preparation/Oral Phase Oral Phase: Impaired Oral - Nectar Oral - Nectar Teaspoon: Lingual pumping;Reduced posterior  propulsion;Weak lingual manipulation Oral - Nectar Cup: Lingual pumping;Reduced posterior  propulsion;Piecemeal swallowing;Weak lingual manipulation Oral - Thin Oral - Thin Teaspoon: Lingual pumping;Reduced posterior  propulsion;Weak lingual manipulation Oral - Thin Cup: Lingual pumping;Reduced posterior  propulsion;Piecemeal swallowing;Weak lingual manipulation Oral - Solids Oral - Puree:  Lingual pumping;Reduced posterior  propulsion;Piecemeal swallowing;Weak lingual manipulation Oral - Mechanical Soft: Lingual pumping;Reduced posterior  propulsion;Impaired mastication;Piecemeal  swallowing;Lingual/palatal residue;Weak lingual  manipulation;Delayed oral transit   Pharyngeal Phase Pharyngeal Phase Pharyngeal Phase: Impaired Pharyngeal - Nectar Pharyngeal - Nectar Teaspoon: Premature spillage to  valleculae;Pharyngeal residue - valleculae;Reduced tongue base  retraction Pharyngeal - Nectar Cup: Premature spillage to  valleculae;Pharyngeal residue - valleculae;Reduced tongue base  retraction Pharyngeal - Thin Pharyngeal -  Thin Cup: Premature spillage to valleculae;Trace  aspiration;Reduced tongue base retraction;Pharyngeal residue -  valleculae Pharyngeal - Thin Straw: Not tested Pharyngeal - Solids Pharyngeal - Puree: Premature spillage to valleculae;Trace  aspiration;Reduced tongue base retraction;Pharyngeal residue -  valleculae Pharyngeal - Mechanical Soft: Premature spillage to  valleculae;Reduced pharyngeal peristalsis;Reduced tongue base  retraction;Pharyngeal residue - valleculae Pharyngeal Phase - Comment Pharyngeal Comment: liquid swallows and dry swallows aid  pharyngeal clearance, controlling amount of bolus consumed with  liquids prevents overt aspiration  Cervical Esophageal Phase    GO    Cervical Esophageal Phase Cervical Esophageal Phase: Impaired Cervical Esophageal Phase - Nectar Nectar Teaspoon: Reduced cricopharyngeal relaxation Nectar Cup: Reduced cricopharyngeal relaxation Cervical Esophageal Phase - Thin Thin Teaspoon: Reduced cricopharyngeal relaxation Thin Cup: Reduced cricopharyngeal relaxation Cervical Esophageal Phase - Solids Puree: Prominent cricopharyngeal segment;Reduced cricopharyngeal  relaxation Mechanical Soft: Reduced cricopharyngeal relaxation Cervical Esophageal Phase - Comment Cervical Esophageal Comment: appearance of mildly slow clearance  below UES - most  notably with pudding/cereal bar that cleared  with liquid intake, remainder of esophagus appeared to clear well  - radiologist not present to confirm findings         [name] [name] Kimball, MS [facility] SLP [phone]    Mr Maxine GlennMra Head/brain Wo Cm  10/10/2013   CLINICAL DATA:  78 year old male with previous traumatic brain injury and new dysarthria, right facial droop, and right tongue deviation.  EXAM: MRI HEAD WITHOUT CONTRAST  MRA HEAD WITHOUT CONTRAST  TECHNIQUE: Multiplanar, multiecho pulse sequences of the brain and surrounding structures were obtained without intravenous contrast. Angiographic images of the head were obtained using MRA technique without contrast.  COMPARISON:  None.  FINDINGS: MRI HEAD FINDINGS  No evidence for acute infarction, hemorrhage, mass lesion, or extra-axial fluid. Hydrocephalus ex vacuo. Chronic microvascular ischemic change.  Large remote left thalamic and posterior putaminal hemorrhage also affecting the centrum semiovale results in significant encephalomalacia, and left-sided Wallerian degeneration of the brainstem. Flow voids are maintained. No other areas of chronic hemorrhage. No midline shift. Unremarkable pituitary and cerebellar tonsils. No osseous lesions.  MRA HEAD FINDINGS  Internal carotid arteries are widely patent. The left ICA is slightly smaller than the right, secondary to the dominant contribution of both anterior cerebral arteries from the right. Hypoplastic left A1 ACA. No proximal MCA or trifurcation disease. Bilateral fetal PCA origins. Basilar artery widely patent with vertebrals codominant. No intracranial branch occlusion or aneurysm. Slight irregularity distal right vertebral without focal stenosis.  IMPRESSION: Chronic changes as described. Remote left basal ganglia, thalamic, and deep white matter hemorrhagic infarction.  No acute infarct is demonstrated.   Electronically Signed   By: Davonna BellingJohn  Curnes M.D.   On: 10/10/2013 14:33    History of Present  Illness: [name] HeinzJohn F [name] is a 78 y.o. 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 Patient was last seen in his usual state health at 5:30 this evening, found by his wife later in the evening to have garbled speech, "not making any sense" with significant difficulties getting his words out. Initially his wife thought this was due to to him being half asleep. At around 10 PM she called 911 as she noted that he had not been improving. Code CVA was called as initial CT scan of brain did not reveal acute intracranial abnormalities. It did show old left  thalamic and basilar ganglier infarct as well as chronic microvascular ischemic change. During my evaluation patient continues to be dysarthric. Patient was seen and evaluated by Dr. Amada Jupiter of neurology in the emergency room. Patient unable to provide history given significant dysarthria. Straight obtained from patient's wife present at bedside and medical record.    Hospital Course: [name] is a patient well-known to myself as is his wife readmitted at this time with increasing hemiparesis and dysarthria as well as altered mental status. He had a fairly protracted hospital course and skilled nursing stay as a result of a gangrenous gallbladder requiring a cool down period and ultimately surgery. Prior to this he was in a declining course under extensive neurologic evaluation felt secondary to a vascular type dementia progressive superimposed upon his remote extensive traumatic left brain injury which was long-standing. He presented to the emergency room with altered mental status, dysarthria and continued to decline admitted with what was thought to be initially stroke. Ultimately had a fairly extensive neuroimaging workup, evaluation and Dr. Pearlean Brownie and medical evaluation  initially by tried hospitalist and subsequently myself. While he is under treatment for an empiric UTI is truly felt that this staggering decline is likely primary neurologic and neuro degenerative as a result of his vascular status and dementia. He manifests most notably with a fairly extensive right hemiparesis, dysarthria, pseudobulbar palsy and further decline is expected. Feedings are tenuous but he has passed a swallowing study such that he is on a dysphagia 1 diet pured with nectar thick liquids assist with all meals. At some point he may indeed be a candidate for a Education administrator. He's been stable from a cardiopulmonary standpoint. He is completing an empiric course of Cipro albeit it is not felt that underlying UTI is necessarily causative in this entire process. I did have an extensive discussion with Carney Bern by phone and believe that his current care level exceeds her ability. While he may stand some improvement with some attempts at rehabilitation again says that he may be manageable from an ADL standpoint temporarily time will tell but ultimately custodial care will be the big issue. He is transition at this time to skilled nursing.  Day of Discharge Exam BP 161/88  Pulse 76  Temp(Src) 97.6 F (36.4 C) (Oral)  Resp 20  Ht 6' (1.829 m)  Wt 82.01 kg (180 lb 12.8 oz)  BMI 24.52 kg/m2  SpO2 99%  Physical Exam: General appearance: alert, cooperative, no distress and slowed mentation Eyes: no scleral icterus Throat: oropharynx moist without erythema Resp: clear to auscultation bilaterally Cardio: regular rate and rhythm, S1, S2 normal, no murmur, click, rub or gallop Extremities: no clubbing, cyanosis or edema Neurologically he is confused but alert will converse with significant dysarthria and a fairly extensive right hemiparesis. Also has evidence for pseudobulbar palsy.  Discharge Labs:  Recent Labs  10/11/13 0500  NA 143  K 4.1  CL 108  CO2 24  GLUCOSE 102*  BUN 10  CREATININE 0.98   CALCIUM 9.0   No results found for this basename: AST, ALT, ALKPHOS, BILITOT, PROT, ALBUMIN,  in the last 72 hours  Recent Labs  10/11/13 0500  WBC 7.6  HGB 13.5  HCT 42.1  MCV 81.7  PLT 341   No results found for this basename: CKTOTAL, CKMB, CKMBINDEX, TROPONINI,  in the last 72 hours No results found for this basename: TSH, T4TOTAL, FREET3, T3FREE, THYROIDAB,  in the last 72 hours No results found for this basename: VITAMINB12, FOLATE,  FERRITIN, TIBC, IRON, RETICCTPCT,  in the last 72 hours  Discharge instructions:     Discharge Instructions   Diet - low sodium heart healthy    Complete by:  As directed      Increase activity slowly    Complete by:  As directed            Disposition: Skilled nursing  Follow-up Appts: Follow-up with Dr. Jacky Kindle at [facility] in skilled nursing.  Call for appointment.  Condition on Discharge: Guarded but improved  Tests Needing Follow-up: None ultimately CODE STATUS will have to be addressed, and ultimately he may need a PEG  Signed: [name] 10/13/2013, 7:37 AM

## 2013-10-15 LAB — URINE CULTURE: Colony Count: 50000

## 2013-10-18 ENCOUNTER — Ambulatory Visit: Payer: Medicare Other | Admitting: Neurology

## 2013-10-25 ENCOUNTER — Telehealth: Payer: Self-pay | Admitting: *Deleted

## 2013-10-25 NOTE — Telephone Encounter (Signed)
i am happy to see him whenever he is easier to transport, he doesn't have to come now. Thanks you . D

## 2013-10-25 NOTE — Telephone Encounter (Signed)
Patient's wife states he is worse since the last time you saw him but he is better than he was in the hospital.  Patient's [facility] which [facility] is getting  OT, PT and St everyday.  Patient 's wife wanted to know does he have to come in now, because it is such a task getting the transportation arranged to bring him in.  Please call back on home number first and if no answer try the cell [phone] Carney Bern(Jean).

## 2013-10-25 NOTE — Telephone Encounter (Signed)
Patient's wife was notified.  

## 2013-11-03 ENCOUNTER — Telehealth: Payer: Self-pay | Admitting: *Deleted

## 2013-11-06 NOTE — Telephone Encounter (Signed)
Called pt and spoke with pt's wife Carney BernJean and she stated that she would like to get more information on Pseudobulbar Palsy. I informed the wife that Pseudobulbar palsy is an inability to control the muscles in the face. It can have a large impact on a person's ability to speak. It often results in uncontrollable crying or laughing at inappropriate times. The condition may also affect a person's ability to swallow. Wife asked if I could send some information to her in the mail. Information was sent to the pt's wife about Pseudobulbar Palsy. I advised the pt's wife that if the pt has any other problems, questions or concerns to call the office. Pt's wife verbalized understanding.

## 2014-01-25 ENCOUNTER — Telehealth: Payer: Self-pay | Admitting: Neurology

## 2014-01-25 NOTE — Telephone Encounter (Signed)
Patient's spouse stated pt suffered with really bad vertigo since 2011.  Had gallbladder removed in April, since surgery has not had one second of Vertigo.  Questioning if there's possibly connection between gallbladder and vertigo, PS spouse thinks this is the most ridiculous question ever, but curious.  Please return on cell [phone] and may leave detailed message.

## 2014-01-26 NOTE — Telephone Encounter (Signed)
Let patient know that i cannot see a correlation. CD

## 2014-01-26 NOTE — Telephone Encounter (Signed)
Dr. Vickey Huger, I am referring this to you.  I couldn't address this question.  Thanks

## 2015-02-14 ENCOUNTER — Encounter (HOSPITAL_COMMUNITY): Payer: Self-pay | Admitting: Emergency Medicine

## 2015-02-14 ENCOUNTER — Ambulatory Visit (HOSPITAL_COMMUNITY)
Admission: RE | Admit: 2015-02-14 | Discharge: 2015-02-14 | Disposition: A | Discharge status: Home / Self Care | Payer: Medicare Other | Source: Ambulatory Visit | Attending: General Surgery | Admitting: General Surgery

## 2015-02-14 ENCOUNTER — Other Ambulatory Visit: Payer: Self-pay | Admitting: Physician Assistant

## 2015-02-14 ENCOUNTER — Other Ambulatory Visit: Payer: Self-pay | Admitting: General Surgery

## 2015-02-14 ENCOUNTER — Inpatient Hospital Stay (HOSPITAL_COMMUNITY)
Admission: EM | Admit: 2015-02-14 | Discharge: 2015-02-20 | DRG: 372 | Disposition: A | Discharge status: Skilled Nursing Facility | Payer: Medicare Other | Attending: Internal Medicine | Admitting: Internal Medicine

## 2015-02-14 ENCOUNTER — Other Ambulatory Visit (HOSPITAL_COMMUNITY): Payer: Self-pay | Admitting: General Surgery

## 2015-02-14 DIAGNOSIS — L02213 Cutaneous abscess of chest wall: Secondary | ICD-10-CM

## 2015-02-14 DIAGNOSIS — S0990XS Unspecified injury of head, sequela: Secondary | ICD-10-CM

## 2015-02-14 DIAGNOSIS — K651 Peritoneal abscess: Secondary | ICD-10-CM | POA: Diagnosis present

## 2015-02-14 DIAGNOSIS — Z8673 Personal history of transient ischemic attack (TIA), and cerebral infarction without residual deficits: Secondary | ICD-10-CM | POA: Diagnosis not present

## 2015-02-14 DIAGNOSIS — M549 Dorsalgia, unspecified: Secondary | ICD-10-CM | POA: Diagnosis present

## 2015-02-14 DIAGNOSIS — J869 Pyothorax without fistula: Secondary | ICD-10-CM | POA: Diagnosis not present

## 2015-02-14 DIAGNOSIS — E7800 Pure hypercholesterolemia, unspecified: Secondary | ICD-10-CM | POA: Diagnosis present

## 2015-02-14 DIAGNOSIS — Z96651 Presence of right artificial knee joint: Secondary | ICD-10-CM | POA: Diagnosis present

## 2015-02-14 DIAGNOSIS — E785 Hyperlipidemia, unspecified: Secondary | ICD-10-CM | POA: Diagnosis present

## 2015-02-14 DIAGNOSIS — D509 Iron deficiency anemia, unspecified: Secondary | ICD-10-CM | POA: Diagnosis present

## 2015-02-14 DIAGNOSIS — G8929 Other chronic pain: Secondary | ICD-10-CM | POA: Diagnosis present

## 2015-02-14 DIAGNOSIS — M1990 Unspecified osteoarthritis, unspecified site: Secondary | ICD-10-CM | POA: Diagnosis present

## 2015-02-14 DIAGNOSIS — Z9049 Acquired absence of other specified parts of digestive tract: Secondary | ICD-10-CM

## 2015-02-14 DIAGNOSIS — Z888 Allergy status to other drugs, medicaments and biological substances status: Secondary | ICD-10-CM

## 2015-02-14 DIAGNOSIS — E041 Nontoxic single thyroid nodule: Secondary | ICD-10-CM | POA: Insufficient documentation

## 2015-02-14 DIAGNOSIS — G8191 Hemiplegia, unspecified affecting right dominant side: Secondary | ICD-10-CM | POA: Diagnosis present

## 2015-02-14 DIAGNOSIS — K449 Diaphragmatic hernia without obstruction or gangrene: Secondary | ICD-10-CM | POA: Insufficient documentation

## 2015-02-14 DIAGNOSIS — I10 Essential (primary) hypertension: Secondary | ICD-10-CM | POA: Diagnosis present

## 2015-02-14 DIAGNOSIS — R188 Other ascites: Secondary | ICD-10-CM | POA: Diagnosis present

## 2015-02-14 DIAGNOSIS — Z23 Encounter for immunization: Secondary | ICD-10-CM | POA: Diagnosis not present

## 2015-02-14 DIAGNOSIS — D649 Anemia, unspecified: Secondary | ICD-10-CM | POA: Diagnosis present

## 2015-02-14 DIAGNOSIS — J90 Pleural effusion, not elsewhere classified: Secondary | ICD-10-CM | POA: Insufficient documentation

## 2015-02-14 DIAGNOSIS — Z87820 Personal history of traumatic brain injury: Secondary | ICD-10-CM

## 2015-02-14 DIAGNOSIS — G250 Essential tremor: Secondary | ICD-10-CM | POA: Diagnosis present

## 2015-02-14 DIAGNOSIS — Z87891 Personal history of nicotine dependence: Secondary | ICD-10-CM | POA: Diagnosis not present

## 2015-02-14 DIAGNOSIS — Z79899 Other long term (current) drug therapy: Secondary | ICD-10-CM

## 2015-02-14 DIAGNOSIS — Z7982 Long term (current) use of aspirin: Secondary | ICD-10-CM

## 2015-02-14 DIAGNOSIS — L02211 Cutaneous abscess of abdominal wall: Secondary | ICD-10-CM | POA: Diagnosis present

## 2015-02-14 DIAGNOSIS — R1310 Dysphagia, unspecified: Secondary | ICD-10-CM | POA: Diagnosis present

## 2015-02-14 DIAGNOSIS — G8190 Hemiplegia, unspecified affecting unspecified side: Secondary | ICD-10-CM

## 2015-02-14 DIAGNOSIS — Z880 Allergy status to penicillin: Secondary | ICD-10-CM | POA: Diagnosis not present

## 2015-02-14 DIAGNOSIS — F0391 Unspecified dementia with behavioral disturbance: Secondary | ICD-10-CM | POA: Diagnosis present

## 2015-02-14 DIAGNOSIS — B9562 Methicillin resistant Staphylococcus aureus infection as the cause of diseases classified elsewhere: Secondary | ICD-10-CM | POA: Diagnosis present

## 2015-02-14 DIAGNOSIS — F419 Anxiety disorder, unspecified: Secondary | ICD-10-CM | POA: Diagnosis present

## 2015-02-14 DIAGNOSIS — F329 Major depressive disorder, single episode, unspecified: Secondary | ICD-10-CM | POA: Diagnosis present

## 2015-02-14 HISTORY — DX: Cerebral infarction, unspecified: I63.9

## 2015-02-14 LAB — BASIC METABOLIC PANEL
Anion gap: 5 (ref 5–15)
BUN: 12 mg/dL (ref 6–20)
CALCIUM: 8.6 mg/dL — AB (ref 8.9–10.3)
CO2: 28 mmol/L (ref 22–32)
CREATININE: 0.88 mg/dL (ref 0.61–1.24)
Chloride: 103 mmol/L (ref 101–111)
GFR calc non Af Amer: >60 mL/min (ref >60)
Glucose, Bld: 131 mg/dL — ABNORMAL HIGH (ref 65–99)
Potassium: 4.6 mmol/L (ref 3.5–5.1)
SODIUM: 136 mmol/L (ref 135–145)

## 2015-02-14 LAB — CBC WITH DIFFERENTIAL/PLATELET
BASOS ABS: 0.1 10*3/uL (ref 0.0–0.1)
BASOS PCT: 1 %
EOS ABS: 0.3 10*3/uL (ref 0.0–0.7)
Eosinophils Relative: 3 %
HCT: 35.1 % — ABNORMAL LOW (ref 39.0–52.0)
Hemoglobin: 10.5 g/dL — ABNORMAL LOW (ref 13.0–17.0)
Lymphocytes Relative: 14 %
Lymphs Abs: 1.2 10*3/uL (ref 0.7–4.0)
MCH: 21.8 pg — ABNORMAL LOW (ref 26.0–34.0)
MCHC: 29.9 g/dL — AB (ref 30.0–36.0)
MCV: 72.8 fL — ABNORMAL LOW (ref 78.0–100.0)
MONO ABS: 0.4 10*3/uL (ref 0.1–1.0)
Monocytes Relative: 4 %
NEUTROS PCT: 78 %
Neutro Abs: 6.8 10*3/uL (ref 1.7–7.7)
PLATELETS: 444 10*3/uL — AB (ref 150–400)
RBC: 4.82 MIL/uL (ref 4.22–5.81)
RDW: 17.3 % — AB (ref 11.5–15.5)
WBC: 8.8 10*3/uL (ref 4.0–10.5)

## 2015-02-14 LAB — POCT I-STAT CREATININE: CREATININE: 1.1 mg/dL (ref 0.61–1.24)

## 2015-02-14 LAB — I-STAT CG4 LACTIC ACID, ED: Lactic Acid, Venous: 1.24 mmol/L (ref 0.5–2.0)

## 2015-02-14 MED ORDER — POLYVINYL ALCOHOL 1.4 % OP SOLN
1.0000 [drp] | Freq: Three times a day (TID) | OPHTHALMIC | Status: DC | PRN
  Filled 2015-02-14: qty 15

## 2015-02-14 MED ORDER — FINASTERIDE 5 MG PO TABS
5.0000 mg | ORAL_TABLET | Freq: Every day | ORAL | Status: DC
  Administered 2015-02-15 – 2015-02-20 (×6): 5 mg via ORAL
  Filled 2015-02-14 (×6): qty 1

## 2015-02-14 MED ORDER — CYANOCOBALAMIN 500 MCG PO TABS
500.0000 ug | ORAL_TABLET | Freq: Every day | ORAL | Status: DC
  Administered 2015-02-15 – 2015-02-20 (×6): 500 ug via ORAL
  Filled 2015-02-14 (×6): qty 1

## 2015-02-14 MED ORDER — VITAMIN D3 25 MCG (1000 UNIT) PO TABS
1000.0000 [IU] | ORAL_TABLET | Freq: Every day | ORAL | Status: DC
  Administered 2015-02-15 – 2015-02-20 (×6): 1000 [IU] via ORAL
  Filled 2015-02-14 (×6): qty 1

## 2015-02-14 MED ORDER — DEXTROMETHORPHAN-QUINIDINE 20-10 MG PO CAPS: 1.0000 | ORAL_CAPSULE | Freq: Every day | ORAL | Status: DC

## 2015-02-14 MED ORDER — MIRTAZAPINE 7.5 MG PO TABS
7.5000 mg | ORAL_TABLET | Freq: Every day | ORAL | Status: DC
  Administered 2015-02-14 – 2015-02-19 (×6): 7.5 mg via ORAL
  Filled 2015-02-14 (×7): qty 1

## 2015-02-14 MED ORDER — CLONAZEPAM 0.5 MG PO TABS
0.5000 mg | ORAL_TABLET | Freq: Two times a day (BID) | ORAL | Status: DC | PRN
  Administered 2015-02-18 – 2015-02-19 (×2): 0.5 mg via ORAL
  Filled 2015-02-14 (×2): qty 1

## 2015-02-14 MED ORDER — ONDANSETRON HCL 4 MG PO TABS: 4.0000 mg | ORAL_TABLET | Freq: Four times a day (QID) | ORAL | Status: DC | PRN

## 2015-02-14 MED ORDER — ENOXAPARIN SODIUM 40 MG/0.4ML ~~LOC~~ SOLN
40.0000 mg | SUBCUTANEOUS | Status: DC
  Administered 2015-02-14: 40 mg via SUBCUTANEOUS
  Filled 2015-02-14 (×2): qty 0.4

## 2015-02-14 MED ORDER — ASPIRIN 325 MG PO TABS
325.0000 mg | ORAL_TABLET | Freq: Every day | ORAL | Status: DC
  Administered 2015-02-15 – 2015-02-20 (×6): 325 mg via ORAL
  Filled 2015-02-14 (×6): qty 1

## 2015-02-14 MED ORDER — IOHEXOL 300 MG/ML  SOLN
75.0000 mL | Freq: Once | INTRAMUSCULAR | Status: AC | PRN
  Administered 2015-02-14: 75 mL via INTRAVENOUS

## 2015-02-14 MED ORDER — VITAMIN C 500 MG PO TABS
1000.0000 mg | ORAL_TABLET | Freq: Every day | ORAL | Status: DC
  Administered 2015-02-15 – 2015-02-20 (×6): 1000 mg via ORAL
  Filled 2015-02-14 (×6): qty 2

## 2015-02-14 MED ORDER — DEXTROSE 5 % IV SOLN
2.0000 g | Freq: Three times a day (TID) | INTRAVENOUS | Status: DC
  Administered 2015-02-15 – 2015-02-18 (×11): 2 g via INTRAVENOUS
  Filled 2015-02-14 (×11): qty 2

## 2015-02-14 MED ORDER — ADULT MULTIVITAMIN W/MINERALS CH
1.0000 | ORAL_TABLET | Freq: Every day | ORAL | Status: DC
  Administered 2015-02-15 – 2015-02-20 (×6): 1 via ORAL
  Filled 2015-02-14 (×6): qty 1

## 2015-02-14 MED ORDER — DOCUSATE SODIUM 100 MG PO CAPS
100.0000 mg | ORAL_CAPSULE | Freq: Every evening | ORAL | Status: DC
  Administered 2015-02-15 – 2015-02-19 (×5): 100 mg via ORAL
  Filled 2015-02-14 (×6): qty 1

## 2015-02-14 MED ORDER — VANCOMYCIN HCL 10 G IV SOLR
1250.0000 mg | Freq: Once | INTRAVENOUS | Status: AC
  Administered 2015-02-14: 1250 mg via INTRAVENOUS
  Filled 2015-02-14: qty 1250

## 2015-02-14 MED ORDER — ONDANSETRON HCL 4 MG/2ML IJ SOLN: 4.0000 mg | Freq: Four times a day (QID) | INTRAMUSCULAR | Status: DC | PRN

## 2015-02-14 MED ORDER — POLYETHYLENE GLYCOL 3350 17 G PO PACK
17.0000 g | PACK | Freq: Every day | ORAL | Status: DC
  Administered 2015-02-15 – 2015-02-20 (×6): 17 g via ORAL
  Filled 2015-02-14 (×7): qty 1

## 2015-02-14 MED ORDER — BACLOFEN 20 MG PO TABS
20.0000 mg | ORAL_TABLET | Freq: Every evening | ORAL | Status: DC
  Administered 2015-02-14 – 2015-02-19 (×6): 20 mg via ORAL
  Filled 2015-02-14 (×7): qty 1

## 2015-02-14 MED ORDER — QUETIAPINE FUMARATE 25 MG PO TABS
25.0000 mg | ORAL_TABLET | Freq: Every day | ORAL | Status: DC
  Administered 2015-02-14 – 2015-02-19 (×6): 25 mg via ORAL
  Filled 2015-02-14 (×7): qty 1

## 2015-02-14 MED ORDER — DEXTROSE 5 % IV SOLN
2.0000 g | Freq: Once | INTRAVENOUS | Status: AC
  Administered 2015-02-14: 2 g via INTRAVENOUS
  Filled 2015-02-14: qty 2

## 2015-02-14 MED ORDER — FERROUS SULFATE 325 (65 FE) MG PO TABS
325.0000 mg | ORAL_TABLET | Freq: Two times a day (BID) | ORAL | Status: DC
  Administered 2015-02-15 – 2015-02-20 (×11): 325 mg via ORAL
  Filled 2015-02-14 (×13): qty 1

## 2015-02-14 MED ORDER — INFLUENZA VAC SPLIT QUAD 0.5 ML IM SUSY
0.5000 mL | PREFILLED_SYRINGE | INTRAMUSCULAR | Status: AC
  Administered 2015-02-15: 0.5 mL via INTRAMUSCULAR
  Filled 2015-02-14 (×2): qty 0.5

## 2015-02-14 MED ORDER — METOPROLOL SUCCINATE ER 50 MG PO TB24
50.0000 mg | ORAL_TABLET | Freq: Every day | ORAL | Status: DC
  Administered 2015-02-15 – 2015-02-20 (×6): 50 mg via ORAL
  Filled 2015-02-14 (×6): qty 1

## 2015-02-14 MED ORDER — VANCOMYCIN HCL IN DEXTROSE 1-5 GM/200ML-% IV SOLN
1000.0000 mg | Freq: Two times a day (BID) | INTRAVENOUS | Status: DC
  Administered 2015-02-15 – 2015-02-17 (×5): 1000 mg via INTRAVENOUS
  Filled 2015-02-14 (×5): qty 200

## 2015-02-14 NOTE — ED Notes (Signed)
Surgery at bedside.

## 2015-02-14 NOTE — Progress Notes (Signed)
ANTIBIOTIC CONSULT NOTE - INITIAL  Pharmacy Consult for Vancomycin Indication: abscess  Allergies  Allergen Reactions  . Ace Inhibitors Swelling  . Penicillins Swelling    Has patient had a PCN reaction causing immediate rash, facial/tongue/throat swelling, SOB or lightheadedness with hypotension: yes Has patient had a PCN reaction causing severe rash involving mucus membranes or skin necrosis: unknown Has patient had a PCN reaction that required hospitalization no Has patient had a PCN reaction occurring within the last 10 years: no If all of the above answers are "NO", then may proceed with Cephalosporin use.     Patient Measurements:   Adjusted Body Weight:   Vital Signs: Temp: 97.8 F (36.6 C) (10/06 1459) Temp Source: Oral (10/06 1459) BP: 116/57 mmHg (10/06 1459) Pulse Rate: 58 (10/06 1459) Intake/Output from previous day:   Intake/Output from this shift:    Labs:  Recent Labs  02/14/15 1241  CREATININE 1.10   CrCl cannot be calculated (Unknown ideal weight.). No results for input(s): VANCOTROUGH, VANCOPEAK, VANCORANDOM, GENTTROUGH, GENTPEAK, GENTRANDOM, TOBRATROUGH, TOBRAPEAK, TOBRARND, AMIKACINPEAK, AMIKACINTROU, AMIKACIN in the last 72 hours.   Microbiology: No results found for this or any previous visit (from the past 720 hour(s)).  Medical History: Past Medical History  Diagnosis Date  . Hemiplegia (HCC) 09/07/1970    "horse fell on him"  . Vertigo   . Hypertension   . TBI (traumatic brain injury) (HCC) 09/07/1970    WITH LEFT HEMISPHERIC BLEED  . High cholesterol     "not on RX; taken off d/t vertigo fall 2014"  . Chronic pain     lower back "hasn't complained in months" (09/07/2013)  . Dementia   . Swelling of upper lip     tongue  . Weakness generalized 12/22/2012    "not a problem lately" (09/07/2013)  . Dementia with behavioral disturbance 04/18/2013  . Cholecystitis   . H/O hiatal hernia   . Arthritis     "fingers" (09/07/2013)  . Cancer  (HCC)   . Clonus     "RLE" "hadn't bothered him for awhile; at least 6 months" (09/07/2013)  . Familial tremor      Assessment: 34 yoM from nursing facility was seen by [facility] for draining abscess underneath arm.  Will be admitted for percutaneous drain placement and antibiotics.  Pharmacy consulted to start vancomycin. Aztreonam x 1 ordered in ED.  Afebrile, WBC pending, CrCl~64 ml/min using old weight of 82 kg and SCr 1.10  Goal of Therapy:  Vancomycin trough level 15-20 mcg/ml  Plan:  1.  Vancomycin 1250 mg x 1 then 1g IV q12h. 2.  F/u for orders for additional antibiotics.  Recommend continued gram negative and anaerobic empiric coverage for abscess. 3.  F/u current weight, trough levels as indicated, clinical course.  Clance Boll 02/14/2015,5:37 PM

## 2015-02-14 NOTE — ED Provider Notes (Signed)
CSN: [id number]     Arrival date & time 02/14/15  1449 History   First MD Initiated Contact with Patient 02/14/15 1645     Chief Complaint  Patient presents with  . Abscess sent from [facility]    HPI Pt is a resident of a nursing facility.  They noticed drainage from an area underneath his arm a few days ago.  It drained a large amount of fluid that included black material in it.  The medical provider felt that he had a draining abscess.  Pt was sent to [facility] to have an I&D.  He was seen yesterday and He was sent to have a CT scan today.   He was called and told to come to the ED because of the findings on the CT scan.   No Fevers.  No vomiting.  Past Medical History  Diagnosis Date  . Hemiplegia (HCC) 09/07/1970    "horse fell on him"  . Vertigo   . Hypertension   . TBI (traumatic brain injury) (HCC) 09/07/1970    WITH LEFT HEMISPHERIC BLEED  . High cholesterol     "not on RX; taken off d/t vertigo fall 2014"  . Chronic pain     lower back "hasn't complained in months" (09/07/2013)  . Dementia   . Swelling of upper lip     tongue  . Weakness generalized 12/22/2012    "not a problem lately" (09/07/2013)  . Dementia with behavioral disturbance 04/18/2013  . Cholecystitis   . H/O hiatal hernia   . Arthritis     "fingers" (09/07/2013)  . Cancer (HCC)   . Clonus     "RLE" "hadn't bothered him for awhile; at least 6 months" (09/07/2013)  . Familial tremor    Past Surgical History  Procedure Laterality Date  . Back surgery    . Total knee arthroplasty Right ~ 2000  . Posterior laminectomy / decompression lumbar spine  2006  . Colon resection  1996  . Multiple tooth extractions  3/14    all upper teeth removed  . Esophagogastroduodenoscopy (egd) with esophageal dilation  ~ 2005  . Laparoscopic cholecystectomy  09/07/2013  . Inguinal hernia repair  1985  . Joint replacement    . Cholecystectomy N/A 09/07/2013    Procedure: LAPAROSCOPIC CHOLECYSTECTOMY ;  Surgeon: Shelly Rubenstein, MD;  Location: [facility] OR;  Service: General;  Laterality: N/A;   Family History  Problem Relation Age of Onset  . Tremor Maternal Grandfather    Social History  Substance Use Topics  . Smoking status: Former Smoker -- 4 years    Types: Cigarettes  . Smokeless tobacco: Never Used     Comment: quit ismoking n 1965  . Alcohol Use: No    Review of Systems  All other systems reviewed and are negative.     Allergies  Ace inhibitors and Penicillins  Home Medications   Prior to Admission medications   Medication Sig Start Date End Date Taking? Authorizing Provider  albuterol (PROAIR HFA) 108 (90 BASE) MCG/ACT inhaler Inhale 2 puffs into the lungs every 3 (three) hours as needed for wheezing or shortness of breath.   Yes Historical Provider, MD  ascorbic acid (VITAMIN C) 1000 MG tablet Take 1,000 mg by mouth daily.   Yes Historical Provider, MD  aspirin 325 MG tablet Take 325 mg by mouth daily.   Yes Historical Provider, MD  baclofen (LIORESAL) 20 MG tablet Take 20 mg by mouth every evening.   Yes Historical  Provider, MD  cholecalciferol (VITAMIN D) 1000 UNITS tablet Take 1,000 Units by mouth daily.     Yes Historical Provider, MD  clonazePAM (KLONOPIN) 0.5 MG tablet Take 1 tablet (0.5 mg total) by mouth 2 (two) times daily as needed (anxiety). 08/09/13  Yes Alysia Penna, MD  cyanocobalamin 500 MCG tablet Take 500 mcg by mouth daily.   Yes Historical Provider, MD  Dextromethorphan-Quinidine (NUEDEXTA) 20-10 MG CAPS Take 1 capsule by mouth daily.   Yes Historical Provider, MD  docusate sodium (COLACE) 100 MG capsule Take 100 mg by mouth every evening.   Yes Historical Provider, MD  doxycycline (VIBRAMYCIN) 100 MG capsule Take 100 mg by mouth 2 (two) times daily. For 7 days 10-4 to 10-11   Yes Historical Provider, MD  ferrous sulfate 325 (65 FE) MG tablet Take 325 mg by mouth 2 (two) times daily.   Yes Historical Provider, MD  finasteride (PROSCAR) 5 MG tablet Take 5 mg by mouth  daily.   Yes Historical Provider, MD  Hypromellose (GENTEAL) 0.3 % SOLN Place 1 drop into both eyes 3 (three) times daily as needed (dry eyes).   Yes Historical Provider, MD  Liniments Chinita Pester) PADS Apply 1 each topically daily as needed (pain). Apply to left knee   Yes Historical Provider, MD  metoprolol succinate (TOPROL-XL) 50 MG 24 hr tablet Take 50 mg by mouth daily. Take with or immediately following a meal.   Yes Historical Provider, MD  mirtazapine (REMERON) 7.5 MG tablet Take 7.5 mg by mouth at bedtime.   Yes Historical Provider, MD  Multiple Vitamin (MULITIVITAMIN WITH MINERALS) TABS Take 1 tablet by mouth daily.     Yes Historical Provider, MD  polyethylene glycol (MIRALAX / GLYCOLAX) packet Take 17 g by mouth daily.   Yes Historical Provider, MD  QUEtiapine (SEROQUEL) 25 MG tablet Take 25 mg by mouth at bedtime.   Yes Historical Provider, MD  Maltodextrin-Xanthan Gum (RESOURCE THICKENUP CLEAR) POWD Take 120 g by mouth as needed. Patient not taking: Reported on 02/14/2015 10/13/13   Geoffry Paradise, MD   BP 116/57 mmHg  Pulse 58  Temp(Src) 97.8 F (36.6 C) (Oral)  Resp 19  SpO2 100% Physical Exam  Constitutional: No distress.  HENT:  Head: Normocephalic and atraumatic.  Right Ear: External ear normal.  Left Ear: External ear normal.  Eyes: Conjunctivae are normal. Right eye exhibits no discharge. Left eye exhibits no discharge. No scleral icterus.  Neck: Neck supple. No tracheal deviation present.  Cardiovascular: Normal rate, regular rhythm and intact distal pulses.   Pulmonary/Chest: Effort normal and breath sounds normal. No stridor. No respiratory distress. He has no wheezes. He has no rales.  Glosser wound right chest wall, no drainage, no surrounding erythema  Abdominal: Soft. Bowel sounds are normal. He exhibits no distension. There is no tenderness. There is no rebound and no guarding.  Musculoskeletal: He exhibits no edema or tenderness.  Neurological: He is alert. No  cranial nerve deficit (no facial droop, extraocular movements intact, no slurred speech) or sensory deficit. He exhibits abnormal muscle tone. He displays no seizure activity. Coordination normal.  Spasticity right upper extremity, contracted; right hemiparesis  Skin: Skin is warm and dry. No rash noted. He is not diaphoretic.  Psychiatric: He has a normal mood and affect.  Nursing note and vitals reviewed.   ED Course  Procedures (including critical care time) Labs Review Labs Reviewed  CBC WITH DIFFERENTIAL/PLATELET - Abnormal; Notable for the following:    Hemoglobin 10.5 (*)  HCT 35.1 (*)    MCV 72.8 (*)    MCH 21.8 (*)    MCHC 29.9 (*)    RDW 17.3 (*)    Platelets 444 (*)    All other components within normal limits  BASIC METABOLIC PANEL  I-STAT CG4 LACTIC ACID, ED    Imaging Review Ct Chest W Contrast  02/14/2015   CLINICAL DATA:  Purulent drainage from right chest wall abscess.  EXAM: CT CHEST WITH CONTRAST  TECHNIQUE: Multidetector CT imaging of the chest was performed during intravenous contrast administration.  CONTRAST:  75mL OMNIPAQUE IOHEXOL 300 MG/ML  SOLN  COMPARISON:  10/09/2013  FINDINGS: Mediastinum/Nodes: Confluent nodules or mass of the right inferior thyroid lobe, 4.8 by 2.8 cm on image 15 series 2 if this has not been previously worked up, thyroid ultrasound would be indicated.  Atherosclerotic calcification of the aortic arch. Large hiatal hernia containing most of the stomach and some of the transverse colon.  Right hilar lymph node 0.8 cm in short axis, image 28 series 2. No other thoracic adenopathy is identified.  Lungs/Pleura: Along the right posterior costophrenic space, and believed to probably be just above the right hemidiaphragm, there is a 10 cm by 11.3 cm by 5.4 cm walled off fluid collection with enhancing margins favoring an abscess. I am suspicious that this may be extending through the seventh intercostal space laterally and probably connecting to  the thick walled 3.7 by 2.4 by 4.4 cm subcutaneous abscess/ fluid collection which in turn extends to the skin surface on image 63 of series 2. There is a Hinostroza non loculated right pleural effusion and associated passive atelectasis. The large hiatal hernia also causes passive atelectasis.  Upper abdomen: The suspected abscess in the posterior costophrenic space mildly indents the posterior margin of the liver due to extrinsic mass effect. Prior cholecystectomy. Left peripelvic cysts.  Musculoskeletal: Severe bilateral degenerative glenohumeral arthropathy. Old nonunited fracture of the right distal clavicle. Multiple chronic bony fragments in the vicinity of the left [facility] joint.  Old healed left lateral third rib fracture. Incidental lipoma of the right teres minor muscle. Atrophy of the left subscapularis and infraspinatus muscles. Free osteochondral fragments in the right subscapular recess.  IMPRESSION: 1. In the right costophrenic sulcus and believed to be just above the right hemidiaphragm, there is 11 cm complex walled off thickly marginally enhancing fluid collection suspicious for subpulmonic abscess, with a Duffee surrounding pleural effusion. There is also an abscess along the right lateral thoracoabdominal wall extending from the seventh intercostal space towards the pleural space, and this could represent drainage of empyema or connection of the original large abscess into the subcutaneous tissues. The subcutaneous collection is probably draining to the skin. Drainage recommended. 2. Large hiatal hernia containing stomach and transverse colon. 3. Severe chronic arthropathy of the shoulders. 4. Confluent nodules or mass of the right inferior thyroid lobe measuring up to 4.8 cm. If not previously worked up, thyroid ultrasound would be recommended in the nonacute setting.   Electronically Signed   By: Gaylyn Rong M.D.   On: 02/14/2015 13:24   I have personally reviewed and evaluated these images and  lab results as part of my medical decision-making.  Medications  aztreonam (AZACTAM) 2 g in dextrose 5 % 50 mL IVPB (2 g Intravenous New Bag/Given 02/14/15 1737)  vancomycin (VANCOCIN) 1,250 mg in sodium chloride 0.9 % 250 mL IVPB (not administered)     MDM   Final diagnoses:  Abscess of chest (HCC)  Discussed case with Dr Ricky Stabs.  Pt was sent to the ED to be medically admitted and percutaneous drain.  Request medical admission.  Start abx.  OK to eat tonight.  NPO post midnight.  Anticipate percutaneous drain tomorrow.  Pt is stable and asymptomatic.  -  Linwood Dibbles, MD 02/14/15 1744

## 2015-02-14 NOTE — ED Notes (Signed)
Admission RN at bedside. 

## 2015-02-14 NOTE — H&P (Signed)
[facility] History and Physical  QUAN CYBULSKI ZOX:[id number] DOB: 1935/11/12 DOA: 02/14/2015  Referring physician: Linwood Dibbles, MD PCP: [name], MD   Chief Complaint: Chest wall Abscess.   HPI: [name] is a 79 y.o. 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 This abscess was discovered several days ago at a local skilled nursing facility, who referred the patient to [facility] for incision and drainage. As the patient and his wife were getting ready to leave the hospital, they received a call from the ordering physician (Dr. Axel Filler), instructing them to go to the emergency department for inpatient treatment.  This abscess was discovered a few days ago and subsequently started draining a large amount of serous purulent discharge and some black hard pellet-like objects, which his wife think may be gallstones, since the abscess location is exactly where the postcholecystectomy drainage was. There has not been any fever, chills, fatigue or malaise. The patient denies pain when asked, but he does not have sensation on his right side.  When seen, the patient was in no acute distress. He is able to communicate, but his speech is slurred as a baseline.   Review of Systems:  Unable to fully review. History was given by his wife.  Past Medical History  Diagnosis Date  . Hemiplegia (HCC) 09/07/1970    "horse fell on him"  . Vertigo   . Hypertension   . TBI (traumatic brain injury) (HCC) 09/07/1970    WITH LEFT HEMISPHERIC BLEED  . High cholesterol     "not on RX; taken off d/t vertigo fall 2014"  . Chronic pain     lower back "hasn't  complained in months" (09/07/2013)  . Dementia   . Swelling of upper lip     tongue  . Weakness generalized 12/22/2012    "not a problem lately" (09/07/2013)  . Dementia with behavioral disturbance 04/18/2013  . Cholecystitis   . H/O hiatal hernia   . Arthritis     "fingers" (09/07/2013)  . Clonus     "RLE" "hadn't bothered him for awhile; at least 6 months" (09/07/2013)  . Familial tremor   . Stroke [facility])    Past Surgical History  Procedure Laterality Date  . Back surgery    . Total knee arthroplasty Right ~ 2000  . Posterior laminectomy / decompression lumbar spine  2006  . Colon resection  1996  . Multiple tooth extractions  3/14    all upper teeth removed  . Esophagogastroduodenoscopy (egd) with esophageal dilation  ~ 2005  . Laparoscopic cholecystectomy  09/07/2013  . Inguinal hernia repair  1985  . Joint replacement    . Cholecystectomy N/A 09/07/2013    Procedure: LAPAROSCOPIC CHOLECYSTECTOMY ;  Surgeon: Shelly Rubenstein, MD;  Location: [facility] OR;  Service: General;  Laterality: N/A;   Social History:  reports that he has quit smoking. His smoking use included Cigarettes. He quit after 4 years of use. He has never used smokeless tobacco. He reports that he does not drink alcohol or use illicit drugs.  Allergies  Allergen Reactions  . Ace Inhibitors Swelling  . Penicillins Swelling    Has patient had a PCN reaction causing  immediate rash, facial/tongue/throat swelling, SOB or lightheadedness with hypotension: yes Has patient had a PCN reaction causing severe rash involving mucus membranes or skin necrosis: unknown Has patient had a PCN reaction that required hospitalization no Has patient had a PCN reaction occurring within the last 10 years: no If all of the above answers are "NO", then may proceed with Cephalosporin use.     Family History  Problem Relation Age of Onset  . Tremor Maternal Grandfather     Prior to Admission medications   Medication Sig Start Date End  Date Taking? Authorizing Provider  albuterol (PROAIR HFA) 108 (90 BASE) MCG/ACT inhaler Inhale 2 puffs into the lungs every 3 (three) hours as needed for wheezing or shortness of breath.   Yes Historical Provider, MD  ascorbic acid (VITAMIN C) 1000 MG tablet Take 1,000 mg by mouth daily.   Yes Historical Provider, MD  aspirin 325 MG tablet Take 325 mg by mouth daily.   Yes Historical Provider, MD  baclofen (LIORESAL) 20 MG tablet Take 20 mg by mouth every evening.   Yes Historical Provider, MD  cholecalciferol (VITAMIN D) 1000 UNITS tablet Take 1,000 Units by mouth daily.     Yes Historical Provider, MD  clonazePAM (KLONOPIN) 0.5 MG tablet Take 1 tablet (0.5 mg total) by mouth 2 (two) times daily as needed (anxiety). 08/09/13  Yes Alysia Penna, MD  cyanocobalamin 500 MCG tablet Take 500 mcg by mouth daily.   Yes Historical Provider, MD  Dextromethorphan-Quinidine (NUEDEXTA) 20-10 MG CAPS Take 1 capsule by mouth daily.   Yes Historical Provider, MD  docusate sodium (COLACE) 100 MG capsule Take 100 mg by mouth every evening.   Yes Historical Provider, MD  doxycycline (VIBRAMYCIN) 100 MG capsule Take 100 mg by mouth 2 (two) times daily. For 7 days 10-4 to 10-11   Yes Historical Provider, MD  ferrous sulfate 325 (65 FE) MG tablet Take 325 mg by mouth 2 (two) times daily.   Yes Historical Provider, MD  finasteride (PROSCAR) 5 MG tablet Take 5 mg by mouth daily.   Yes Historical Provider, MD  Hypromellose (GENTEAL) 0.3 % SOLN Place 1 drop into both eyes 3 (three) times daily as needed (dry eyes).   Yes Historical Provider, MD  Liniments Chinita Pester) PADS Apply 1 each topically daily as needed (pain). Apply to left knee   Yes Historical Provider, MD  metoprolol succinate (TOPROL-XL) 50 MG 24 hr tablet Take 50 mg by mouth daily. Take with or immediately following a meal.   Yes Historical Provider, MD  mirtazapine (REMERON) 7.5 MG tablet Take 7.5 mg by mouth at bedtime.   Yes Historical Provider, MD  Multiple  Vitamin (MULITIVITAMIN WITH MINERALS) TABS Take 1 tablet by mouth daily.     Yes Historical Provider, MD  polyethylene glycol (MIRALAX / GLYCOLAX) packet Take 17 g by mouth daily.   Yes Historical Provider, MD  QUEtiapine (SEROQUEL) 25 MG tablet Take 25 mg by mouth at bedtime.   Yes Historical Provider, MD  Maltodextrin-Xanthan Gum (RESOURCE THICKENUP CLEAR) POWD Take 120 g by mouth as needed. Patient not taking: Reported on 02/14/2015 10/13/13   Geoffry Paradise, MD   Physical Exam: Filed Vitals:   02/14/15 1459  BP: 116/57  Pulse: 58  Temp: 97.8 F (36.6 C)  TempSrc: Oral  Resp: 19  SpO2: 100%    Wt Readings from Last 3 Encounters:  10/10/13 82.01 kg (180 lb 12.8 oz)  09/27/13 74.5 kg (164 lb 3.9 oz)  09/07/13 79.379  kg (175 lb)    General:  Appears calm and comfortable Eyes: PERRL, normal lids, irises & conjunctiva ENT: grossly normal hearing, lips & tongue Neck: no LAD, masses or thyromegaly Cardiovascular: RRR, no m/r/g. No LE edema. Telemetry: SR, no arrhythmias  Respiratory: CTA bilaterally. Abdomen: soft, ntnd Skin: Right lower chest wall has a [place] wound from open abscess with surrounding erythema and induration. Musculoskeletal: Right sided muscular atrophy. Psychiatric: grossly normal mood and affect, baseline speech is slurred.  Neurologic: Right sided hemiplegia, no sensory on right side.           Labs on Admission:  Basic Metabolic Panel:  Recent Labs Lab 02/14/15 1241 02/14/15 1720  NA  --  136  K  --  4.6  CL  --  103  CO2  --  28  GLUCOSE  --  131*  BUN  --  12  CREATININE 1.10 0.88  CALCIUM  --  8.6*   CBC:  Recent Labs Lab 02/14/15 1720  WBC 8.8  NEUTROABS 6.8  HGB 10.5*  HCT 35.1*  MCV 72.8*  PLT 444*    Radiological Exams on Admission: Ct Chest W Contrast  02/14/2015   CLINICAL DATA:  Purulent drainage from right chest wall abscess.  EXAM: CT CHEST WITH CONTRAST  TECHNIQUE: Multidetector CT imaging of the chest was performed  during intravenous contrast administration.  CONTRAST:  75mL OMNIPAQUE IOHEXOL 300 MG/ML  SOLN  COMPARISON:  10/09/2013  FINDINGS: Mediastinum/Nodes: Confluent nodules or mass of the right inferior thyroid lobe, 4.8 by 2.8 cm on image 15 series 2 if this has not been previously worked up, thyroid ultrasound would be indicated.  Atherosclerotic calcification of the aortic arch. Large hiatal hernia containing most of the stomach and some of the transverse colon.  Right hilar lymph node 0.8 cm in short axis, image 28 series 2. No other thoracic adenopathy is identified.  Lungs/Pleura: Along the right posterior costophrenic space, and believed to probably be just above the right hemidiaphragm, there is a 10 cm by 11.3 cm by 5.4 cm walled off fluid collection with enhancing margins favoring an abscess. I am suspicious that this may be extending through the seventh intercostal space laterally and probably connecting to the thick walled 3.7 by 2.4 by 4.4 cm subcutaneous abscess/ fluid collection which in turn extends to the skin surface on image 63 of series 2. There is a Grondin non loculated right pleural effusion and associated passive atelectasis. The large hiatal hernia also causes passive atelectasis.  Upper abdomen: The suspected abscess in the posterior costophrenic space mildly indents the posterior margin of the liver due to extrinsic mass effect. Prior cholecystectomy. Left peripelvic cysts.  Musculoskeletal: Severe bilateral degenerative glenohumeral arthropathy. Old nonunited fracture of the right distal clavicle. Multiple chronic bony fragments in the vicinity of the left [facility] joint.  Old healed left lateral third rib fracture. Incidental lipoma of the right teres minor muscle. Atrophy of the left subscapularis and infraspinatus muscles. Free osteochondral fragments in the right subscapular recess.  IMPRESSION: 1. In the right costophrenic sulcus and believed to be just above the right hemidiaphragm, there is 11  cm complex walled off thickly marginally enhancing fluid collection suspicious for subpulmonic abscess, with a Brinegar surrounding pleural effusion. There is also an abscess along the right lateral thoracoabdominal wall extending from the seventh intercostal space towards the pleural space, and this could represent drainage of empyema or connection of the original large abscess into the subcutaneous tissues. The subcutaneous  collection is probably draining to the skin. Drainage recommended. 2. Large hiatal hernia containing stomach and transverse colon. 3. Severe chronic arthropathy of the shoulders. 4. Confluent nodules or mass of the right inferior thyroid lobe measuring up to 4.8 cm. If not previously worked up, thyroid ultrasound would be recommended in the nonacute setting.   Electronically Signed   By: Gaylyn Rong M.D.   On: 02/14/2015 13:24    Echocardiogram: 10/10/2013 ------------------------------------------------------------------- LV EF: 55% -  60%  ------------------------------------------------------------------- Indications:   CVA 436.  ------------------------------------------------------------------- History:  PMH:  Chest pain. Risk factors: Hypertension. Dyslipidemia.  ------------------------------------------------------------------- Study Conclusions  - Left ventricle: The cavity size was normal. Systolic function was normal. The estimated ejection fraction was in the range of 55% to 60%. Wall motion was normal; there were no regional wall motion abnormalities. Doppler parameters are consistent with abnormal left ventricular relaxation (grade 1 diastolic dysfunction). - Aortic valve: There was trivial regurgitation. - Atrial septum: No defect or patent foramen ovale was identified.  Assessment/Plan  Principal Problem:     Abscess of chest (HCC) Continue IV antibiotic therapy with vancomycin and aztreonam.  Consult pharmacy to dose IV  antibiotics. General surgery is on the case.  Active Problems:     HYPERTENSION, BENIGN Continue metoprolol 50 mg by mouth daily. Monitor blood pressure.      Hemiparesis due to old head trauma (HCC) Continue baclofen for spasticity. Supportive care.      Chronic back pain Continue baclofen.      Anxiety/dementia/depression Continue clonazepam. Continue Remeron. Continue Seroquel.  Dr. Lynelle Doctor in the emergency department consulted general surgery.  Code Status: Full code. DVT Prophylaxis: Lovenox SQ. Family CommunicationArnulfo, [place] [phone]  [phone]  Disposition Plan: Admit for IV antibiotic therapy and surgical evaluation/treatment.  Time spent: Over 70 minutes were spent in the process of this admission.  Bobette Mo [facility] Pager [phone].

## 2015-02-14 NOTE — ED Notes (Signed)
Per wife pt states that pt had CT scan for ruptured abscess on right side today with results sent to [facility].  Wife was called and stated that he was needed to be brought to [facility] ED stat.  Pt's wife states that pt had drain from gallbladder before he could have surgery to remove the gallbladder last year.  The abscess is on the right side where the drain was.

## 2015-02-15 ENCOUNTER — Other Ambulatory Visit (HOSPITAL_COMMUNITY): Payer: Medicare Other

## 2015-02-15 ENCOUNTER — Inpatient Hospital Stay (HOSPITAL_COMMUNITY): Payer: Medicare Other

## 2015-02-15 DIAGNOSIS — G8929 Other chronic pain: Secondary | ICD-10-CM

## 2015-02-15 DIAGNOSIS — M549 Dorsalgia, unspecified: Secondary | ICD-10-CM

## 2015-02-15 DIAGNOSIS — K651 Peritoneal abscess: Secondary | ICD-10-CM | POA: Diagnosis not present

## 2015-02-15 DIAGNOSIS — R188 Other ascites: Secondary | ICD-10-CM | POA: Insufficient documentation

## 2015-02-15 LAB — COMPREHENSIVE METABOLIC PANEL
ALBUMIN: 2.5 g/dL — AB (ref 3.5–5.0)
ALK PHOS: 65 U/L (ref 38–126)
ALT: 31 U/L (ref 17–63)
ANION GAP: 5 (ref 5–15)
AST: 21 U/L (ref 15–41)
BILIRUBIN TOTAL: 0.4 mg/dL (ref 0.3–1.2)
BUN: 11 mg/dL (ref 6–20)
CALCIUM: 8.3 mg/dL — AB (ref 8.9–10.3)
CO2: 25 mmol/L (ref 22–32)
Chloride: 109 mmol/L (ref 101–111)
Creatinine, Ser: 0.86 mg/dL (ref 0.61–1.24)
GFR calc Af Amer: >60 mL/min (ref >60)
GFR calc non Af Amer: >60 mL/min (ref >60)
GLUCOSE: 88 mg/dL (ref 65–99)
POTASSIUM: 4.2 mmol/L (ref 3.5–5.1)
SODIUM: 139 mmol/L (ref 135–145)
Total Protein: 5.7 g/dL — ABNORMAL LOW (ref 6.5–8.1)

## 2015-02-15 LAB — CBC
HEMATOCRIT: 31.5 % — AB (ref 39.0–52.0)
HEMOGLOBIN: 9.8 g/dL — AB (ref 13.0–17.0)
MCH: 22.4 pg — AB (ref 26.0–34.0)
MCHC: 31.1 g/dL (ref 30.0–36.0)
MCV: 72.1 fL — ABNORMAL LOW (ref 78.0–100.0)
Platelets: 410 10*3/uL — ABNORMAL HIGH (ref 150–400)
RBC: 4.37 MIL/uL (ref 4.22–5.81)
RDW: 17.2 % — AB (ref 11.5–15.5)
WBC: 7.4 10*3/uL (ref 4.0–10.5)

## 2015-02-15 LAB — LACTIC ACID, PLASMA
LACTIC ACID, VENOUS: 0.8 mmol/L (ref 0.5–2.0)
LACTIC ACID, VENOUS: 1 mmol/L (ref 0.5–2.0)

## 2015-02-15 MED ORDER — FENTANYL CITRATE (PF) 100 MCG/2ML IJ SOLN
INTRAMUSCULAR | Status: AC
  Filled 2015-02-15: qty 4

## 2015-02-15 MED ORDER — MIDAZOLAM HCL 2 MG/2ML IJ SOLN
INTRAMUSCULAR | Status: AC | PRN
  Administered 2015-02-15: 0.5 mg via INTRAVENOUS

## 2015-02-15 MED ORDER — MIDAZOLAM HCL 2 MG/2ML IJ SOLN
INTRAMUSCULAR | Status: AC
  Filled 2015-02-15: qty 6

## 2015-02-15 MED ORDER — ENOXAPARIN SODIUM 40 MG/0.4ML ~~LOC~~ SOLN
40.0000 mg | SUBCUTANEOUS | Status: DC
  Filled 2015-02-15: qty 0.4

## 2015-02-15 MED ORDER — FENTANYL CITRATE (PF) 100 MCG/2ML IJ SOLN
INTRAMUSCULAR | Status: AC | PRN
  Administered 2015-02-15: 25 ug via INTRAVENOUS
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
## 2015-02-15 NOTE — Procedures (Signed)
Interventional Radiology Procedure Note  Procedure:  US guided drain placement, RUQ, 19F to gravity.  270cc purulent fluid aspirated.   Complications: None Recommendations:  - Record output daily - flush BID - Do not submerge   - Routine care   Signed,  Yvone Neu. Loreta Ave, DO

## 2015-02-15 NOTE — Progress Notes (Signed)
[facility] PROGRESS NOTE  [name]:[id number] DOB: 1935/06/17 DOA: 02/14/2015 PCP: [name], MD  Assessment/Plan: 1. ThoracoAbdominal chest wall abscess -at the site of prior GB drain, h/o lap chole 4/16 -per [facility], IR consulted for drainage -continue broad spectrum Abx -IVF, FU cultures  2. 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 Dementia -vascular and due to #2  4. HTN -continue toprol  5. Anemia -chronic with some worsening could be from dilution -monitor  DVT proph: lovenox held for drain  Code Status: Full Code Family Communication: none at bedside Disposition Plan: ALF/home when improved     HPI/Subjective: No complaints, no events overnight  Objective: Filed Vitals:   02/15/15 0820  BP: 122/72  Pulse: 58  Temp: 97.8 F (36.6 C)  Resp: 16    Intake/Output Summary (Last 24 hours) at 02/15/15 1118 Last data filed at 02/15/15 0518  Gross per 24 hour  Intake      0 ml  Output    725 ml  Net   -725 ml   Filed Weights   02/14/15 1954  Weight: 77.4 kg (170 lb 10.2 oz)    Exam:   General:  AAO x1, confused  Cardiovascular: S1S2/RRR  Respiratory: CTAB  R lower chest/upper abd wall with Birden open wound   Abdomen: soft, NT, BS present  Musculoskeletal: no edema c/c     Data Reviewed: Basic Metabolic Panel:  Recent Labs Lab 02/14/15 1241 02/14/15 1720 02/15/15 0410  NA  --  136 139  K  --  4.6 4.2  CL  --  103 109  CO2  --  28 25  GLUCOSE  --  131* 88  BUN  --  12 11  CREATININE 1.10 0.88 0.86  CALCIUM  --  8.6* 8.3*   Liver Function Tests:  Recent Labs Lab 02/15/15 0410  AST 21  ALT 31  ALKPHOS 65  BILITOT 0.4  PROT 5.7*  ALBUMIN 2.5*   No results for input(s): LIPASE, AMYLASE in the last 168 hours. No results for input(s): AMMONIA in the last 168 hours. CBC:  Recent Labs Lab 02/14/15 1720 02/15/15 0410  WBC 8.8 7.4  NEUTROABS 6.8  --   HGB 10.5*  9.8*  HCT 35.1* 31.5*  MCV 72.8* 72.1*  PLT 444* 410*   Cardiac Enzymes: No results for input(s): CKTOTAL, CKMB, CKMBINDEX, TROPONINI in the last 168 hours. BNP (last 3 results) No results for input(s): BNP in the last 8760 hours.  ProBNP (last 3 results) No results for input(s): PROBNP in the last 8760 hours.  CBG: No results for input(s): GLUCAP in the last 168 hours.  No results found for this or any previous visit (from the past 240 hour(s)).   Studies: Ct Chest W Contrast  02/14/2015   CLINICAL DATA:  Purulent drainage from right chest wall abscess.  EXAM: CT CHEST WITH CONTRAST  TECHNIQUE: Multidetector CT imaging of the chest was performed during intravenous contrast administration.  CONTRAST:  75mL OMNIPAQUE IOHEXOL 300 MG/ML  SOLN  COMPARISON:  10/09/2013  FINDINGS: Mediastinum/Nodes: Confluent nodules or mass of the right inferior thyroid lobe, 4.8 by 2.8 cm on image 15 series 2 if this has not been previously worked up, thyroid ultrasound would be indicated.  Atherosclerotic calcification of the aortic arch. Large hiatal hernia containing most of the stomach and some of the transverse colon.  Right hilar lymph node 0.8 cm in short axis, image 28 series 2. No  other thoracic adenopathy is identified.  Lungs/Pleura: Along the right posterior costophrenic space, and believed to probably be just above the right hemidiaphragm, there is a 10 cm by 11.3 cm by 5.4 cm walled off fluid collection with enhancing margins favoring an abscess. I am suspicious that this may be extending through the seventh intercostal space laterally and probably connecting to the thick walled 3.7 by 2.4 by 4.4 cm subcutaneous abscess/ fluid collection which in turn extends to the skin surface on image 63 of series 2. There is a Vigen non loculated right pleural effusion and associated passive atelectasis. The large hiatal hernia also causes passive atelectasis.  Upper abdomen: The suspected abscess in the posterior  costophrenic space mildly indents the posterior margin of the liver due to extrinsic mass effect. Prior cholecystectomy. Left peripelvic cysts.  Musculoskeletal: Severe bilateral degenerative glenohumeral arthropathy. Old nonunited fracture of the right distal clavicle. Multiple chronic bony fragments in the vicinity of the left [facility] joint.  Old healed left lateral third rib fracture. Incidental lipoma of the right teres minor muscle. Atrophy of the left subscapularis and infraspinatus muscles. Free osteochondral fragments in the right subscapular recess.  IMPRESSION: 1. In the right costophrenic sulcus and believed to be just above the right hemidiaphragm, there is 11 cm complex walled off thickly marginally enhancing fluid collection suspicious for subpulmonic abscess, with a Rake surrounding pleural effusion. There is also an abscess along the right lateral thoracoabdominal wall extending from the seventh intercostal space towards the pleural space, and this could represent drainage of empyema or connection of the original large abscess into the subcutaneous tissues. The subcutaneous collection is probably draining to the skin. Drainage recommended. 2. Large hiatal hernia containing stomach and transverse colon. 3. Severe chronic arthropathy of the shoulders. 4. Confluent nodules or mass of the right inferior thyroid lobe measuring up to 4.8 cm. If not previously worked up, thyroid ultrasound would be recommended in the nonacute setting.   Electronically Signed   By: Gaylyn Rong M.D.   On: 02/14/2015 13:24    Scheduled Meds: . aspirin  325 mg Oral Daily  . aztreonam  2 g Intravenous Q8H  . baclofen  20 mg Oral QPM  . cholecalciferol  1,000 Units Oral Daily  . cyanocobalamin  500 mcg Oral Daily  . Dextromethorphan-Quinidine  1 capsule Oral Daily  . docusate sodium  100 mg Oral QPM  . ferrous sulfate  325 mg Oral BID WC  . finasteride  5 mg Oral Daily  . Influenza vac split quadrivalent PF  0.5  mL Intramuscular Tomorrow-1000  . metoprolol succinate  50 mg Oral Daily  . mirtazapine  7.5 mg Oral QHS  . multivitamin with minerals  1 tablet Oral Daily  . polyethylene glycol  17 g Oral Daily  . QUEtiapine  25 mg Oral QHS  . vancomycin  1,000 mg Intravenous Q12H  . ascorbic acid  1,000 mg Oral Daily   Continuous Infusions:  Antibiotics Given (last 72 hours)    Date/Time Action Medication Dose Rate   02/15/15 0125 Given   aztreonam (AZACTAM) 2 g in dextrose 5 % 50 mL IVPB 2 g 100 mL/hr   02/15/15 0547 Given   vancomycin (VANCOCIN) IVPB 1000 mg/200 mL premix 1,000 mg 200 mL/hr   02/15/15 1005 Given   aztreonam (AZACTAM) 2 g in dextrose 5 % 50 mL IVPB 2 g 100 mL/hr      Principal Problem:   Abscess of chest [facility]) Active Problems:  HYPERTENSION, BENIGN   Hemiparesis due to old head trauma (HCC)   Chronic back pain    Time spent:    [facility]  [facility] Pager [phone]. If 7PM-7AM, please contact night-coverage at [email or web address], password [facility] 02/15/2015, 11:18 AM  LOS: 1 day

## 2015-02-15 NOTE — Progress Notes (Signed)
Patient ID: [name], male   DOB: 08/08/1935, 79 y.o.   MRN: [id number]    Subjective: Pt is known to our practice as he underwent a lap chole after 6 weeks with a perc chole drain by Dr. Magnus Ivan on 09-08-14.  He has been in a facility since then, but he also has a lot of other medical issues.  Dr. Jacky Kindle noted some purulent drainage from his chest wall and he saw Dr. Derrell Lolling in the office yesterday.  He was sent for a CT of the chest and was admitted for the findings.  (Please see detailed description below for scan findings.)  We have been asked to continue to follow him.  Objective: Vital signs in last 24 hours: Temp:  [97.5 F (36.4 C)-97.8 F (36.6 C)] 97.5 F (36.4 C) (10/07 0518) Pulse Rate:  [58-61] 61 (10/07 0518) Resp:  [19-20] 20 (10/07 0518) BP: (116-136)/(57-65) 136/65 mmHg (10/07 0518) SpO2:  [98 %-100 %] 98 % (10/07 0518) Weight:  [77.4 kg (170 lb 10.2 oz)] 77.4 kg (170 lb 10.2 oz) (10/06 1954)    Intake/Output from previous day: 10/06 0701 - 10/07 0700 In: -  Out: 725 [Urine:725] Intake/Output this shift:    PE: Abd: soft, NT, ND, +BS, [place] wound noted in right chest/upper abdominal wall with no current drainage noted. Heart: regular Lungs: CTAB  Lab Results:   Recent Labs  02/14/15 1720 02/15/15 0410  WBC 8.8 7.4  HGB 10.5* 9.8*  HCT 35.1* 31.5*  PLT 444* 410*   BMET  Recent Labs  02/14/15 1720 02/15/15 0410  NA 136 139  K 4.6 4.2  CL 103 109  CO2 28 25  GLUCOSE 131* 88  BUN 12 11  CREATININE 0.88 0.86  CALCIUM 8.6* 8.3*   PT/INR No results for input(s): LABPROT, INR in the last 72 hours. CMP     Component Value Date/Time   NA 139 02/15/2015 0410   NA 137 11/30/2012 0000   K 4.2 02/15/2015 0410   CL 109 02/15/2015 0410   CO2 25 02/15/2015 0410   GLUCOSE 88 02/15/2015 0410   GLUCOSE 100* 11/30/2012 0000   BUN 11 02/15/2015 0410   BUN 9 11/30/2012 0000   CREATININE 0.86 02/15/2015 0410   CALCIUM 8.3* 02/15/2015 0410   PROT 5.7*  02/15/2015 0410   ALBUMIN 2.5* 02/15/2015 0410   AST 21 02/15/2015 0410   ALT 31 02/15/2015 0410   ALKPHOS 65 02/15/2015 0410   BILITOT 0.4 02/15/2015 0410   GFRNONAA >60 02/15/2015 0410   GFRAA >60 02/15/2015 0410   Lipase     Component Value Date/Time   LIPASE 8* 08/06/2013 0502       Studies/Results: Ct Chest W Contrast  02/14/2015   CLINICAL DATA:  Purulent drainage from right chest wall abscess.  EXAM: CT CHEST WITH CONTRAST  TECHNIQUE: Multidetector CT imaging of the chest was performed during intravenous contrast administration.  CONTRAST:  75mL OMNIPAQUE IOHEXOL 300 MG/ML  SOLN  COMPARISON:  10/09/2013  FINDINGS: Mediastinum/Nodes: Confluent nodules or mass of the right inferior thyroid lobe, 4.8 by 2.8 cm on image 15 series 2 if this has not been previously worked up, thyroid ultrasound would be indicated.  Atherosclerotic calcification of the aortic arch. Large hiatal hernia containing most of the stomach and some of the transverse colon.  Right hilar lymph node 0.8 cm in short axis, image 28 series 2. No other thoracic adenopathy is identified.  Lungs/Pleura: Along the right posterior costophrenic space,  and believed to probably be just above the right hemidiaphragm, there is a 10 cm by 11.3 cm by 5.4 cm walled off fluid collection with enhancing margins favoring an abscess. I am suspicious that this may be extending through the seventh intercostal space laterally and probably connecting to the thick walled 3.7 by 2.4 by 4.4 cm subcutaneous abscess/ fluid collection which in turn extends to the skin surface on image 63 of series 2. There is a Bonsell non loculated right pleural effusion and associated passive atelectasis. The large hiatal hernia also causes passive atelectasis.  Upper abdomen: The suspected abscess in the posterior costophrenic space mildly indents the posterior margin of the liver due to extrinsic mass effect. Prior cholecystectomy. Left peripelvic cysts.   Musculoskeletal: Severe bilateral degenerative glenohumeral arthropathy. Old nonunited fracture of the right distal clavicle. Multiple chronic bony fragments in the vicinity of the left [facility] joint.  Old healed left lateral third rib fracture. Incidental lipoma of the right teres minor muscle. Atrophy of the left subscapularis and infraspinatus muscles. Free osteochondral fragments in the right subscapular recess.  IMPRESSION: 1. In the right costophrenic sulcus and believed to be just above the right hemidiaphragm, there is 11 cm complex walled off thickly marginally enhancing fluid collection suspicious for subpulmonic abscess, with a [place] surrounding pleural effusion. There is also an abscess along the right lateral thoracoabdominal wall extending from the seventh intercostal space towards the pleural space, and this could represent drainage of empyema or connection of the original large abscess into the subcutaneous tissues. The subcutaneous collection is probably draining to the skin. Drainage recommended. 2. Large hiatal hernia containing stomach and transverse colon. 3. Severe chronic arthropathy of the shoulders. 4. Confluent nodules or mass of the right inferior thyroid lobe measuring up to 4.8 cm. If not previously worked up, thyroid ultrasound would be recommended in the nonacute setting.   Electronically Signed   By: Gaylyn Rong M.D.   On: 02/14/2015 13:24    Anti-infectives: Anti-infectives    Start     Dose/Rate Route Frequency Ordered Stop   02/15/15 0600  vancomycin (VANCOCIN) IVPB 1000 mg/200 mL premix     1,000 mg 200 mL/hr over 60 Minutes Intravenous Every 12 hours 02/14/15 1746     02/15/15 0200  aztreonam (AZACTAM) 2 g in dextrose 5 % 50 mL IVPB     2 g 100 mL/hr over 30 Minutes Intravenous Every 8 hours 02/14/15 1954     02/14/15 1745  vancomycin (VANCOCIN) 1,250 mg in sodium chloride 0.9 % 250 mL IVPB     1,250 mg 166.7 mL/hr over 90 Minutes Intravenous  Once 02/14/15 1740  02/14/15 1949   02/14/15 1730  aztreonam (AZACTAM) 2 g in dextrose 5 % 50 mL IVPB     2 g 100 mL/hr over 30 Minutes Intravenous  Once 02/14/15 1725 02/14/15 1818       Assessment/Plan  1.  Subpulmonic fluid collection/thoracoabdominal wall abscess -this is very likely a fluid collection from the perc chole drain tract.  He has no fevers or increased WBC.  I have spoken with Dr. Laneta Simmers of CT surgery who has reviewed his imaging.  His recommendation was a perc drain by IR.  The images were reviewed by myself and Dr. Johna Sheriff as well.  We are all in agreement with this plan of care.  Will have IR evaluate the patient for drain placement. -cont IV abx therapy -cont NPO -hold lovenox today for possible procedure.  LOS: 1 day  Sione Baumgarten E 02/15/2015, 8:51 AM Pager: [phone]

## 2015-02-15 NOTE — Care Management Note (Signed)
Case Management Note  Patient Details  Name: ZAC TORTI MRN: [id number] Date of Birth: 03/24/1936  Subjective/Objective:                 abd abcess with hx of recent abd surg   Action/Plan:Date:  Oct. 07, 2016 U.R. performed for needs and level of care. Will continue to follow for Case Management needs.  Marcelle Smiling, RN, BSN, [place]   [phone]  Expected Discharge Date:   (unknown)               Expected Discharge Plan:  Home/Self Care  In-House Referral:  NA  Discharge planning Services  CM Consult  Post Acute Care Choice:  NA Choice offered to:  NA  DME Arranged:    DME Agency:     HH Arranged:    HH Agency:     Status of Service:  In process, will continue to follow  Medicare Important Message Given:    Date Medicare IM Given:    Medicare IM give by:    Date Additional Medicare IM Given:    Additional Medicare Important Message give by:     If discussed at Long Length of Stay Meetings, dates discussed:    Additional Comments:  Golda Acre, RN 02/15/2015, 10:41 AM

## 2015-02-16 LAB — CBC
HEMATOCRIT: 34.9 % — AB (ref 39.0–52.0)
Hemoglobin: 10.6 g/dL — ABNORMAL LOW (ref 13.0–17.0)
MCH: 22.3 pg — ABNORMAL LOW (ref 26.0–34.0)
MCHC: 30.4 g/dL (ref 30.0–36.0)
MCV: 73.5 fL — ABNORMAL LOW (ref 78.0–100.0)
PLATELETS: 377 10*3/uL (ref 150–400)
RBC: 4.75 MIL/uL (ref 4.22–5.81)
RDW: 17.4 % — AB (ref 11.5–15.5)
WBC: 6.8 10*3/uL (ref 4.0–10.5)

## 2015-02-16 LAB — BASIC METABOLIC PANEL
Anion gap: 7 (ref 5–15)
BUN: 10 mg/dL (ref 6–20)
CALCIUM: 8.4 mg/dL — AB (ref 8.9–10.3)
CO2: 26 mmol/L (ref 22–32)
CREATININE: 0.87 mg/dL (ref 0.61–1.24)
Chloride: 105 mmol/L (ref 101–111)
Glucose, Bld: 84 mg/dL (ref 65–99)
Potassium: 4.1 mmol/L (ref 3.5–5.1)
Sodium: 138 mmol/L (ref 135–145)

## 2015-02-16 NOTE — Clinical Social Work Note (Signed)
Clinical Social Work Assessment  Patient Details  Name: [name] MRN: [id number] Date of Birth: December 01, 1935  Date of referral:  02/16/15               Reason for consult:  Facility Placement                Permission sought to share information with:  Facility Industrial/product designer granted to share information::  Yes, Verbal Permission Granted  Name::        Agency::     Relationship::     Contact Information:     Housing/Transportation Living arrangements for the past 2 months:  Skilled Nursing Facility Source of Information:  Spouse Patient Interpreter Needed:    Criminal Activity/Legal Involvement Pertinent to Current Situation/Hospitalization:    Significant Relationships:    Lives with:  Spouse Do you feel safe going back to the place where you live?    Need for family participation in patient care:  Yes (Comment)  Care giving concerns:  Pt's wife has no concerns at this time.   Social Worker assessment / plan:  CSW reviewed pt chart that reflected pt has dementia.  CSW called and spoke with pt's wife to assess for dischaege needs. CSW prompted pt's wife to discuss pt history and current needs.  CSW provided information regarding role and encouraged her to explore her thoughts and feelings regarding pt's health and needs.  CSW provided supportive listening.  CSW will send pt information to [facility] where pt has been a long term resident for the past year.   Employment status:  Retired Health and safety inspector:  Managed Care PT Recommendations:  Not assessed at this time Information / Referral to community resources:     Patient/Family's Response to care:  Pt has dementia.  Pt's wife very pleasant and cooperative in discussing pt history and needs.  Pt's wife stated that pt had been living at [facility] for the past year and she is pleased with this SNF.  Pt's wife discussed that pt's right side is paralyzed due to a mini stroke last year.  Pt's wife stated that  pt has mild dementia.  Pt's wife grateful for CSW support  Patient/Family's Understanding of and Emotional Response to Diagnosis, Current Treatment, and Prognosis:  Pt's wife very informed on pt's health and needs.  Pt's wife optimistic regarding pt's health and SNF placement.  Emotional Assessment Appearance:  Appears stated age Attitude/Demeanor/Rapport:  Sedated Affect (typically observed):  Unable to Assess Orientation:  Fluctuating Orientation (Suspected and/or reported Sundowners) Alcohol / Substance use:    Psych involvement (Current and /or in the community):     Discharge Needs  Concerns to be addressed:    Readmission within the last 30 days:    Current discharge risk:    Barriers to Discharge:  No Barriers Identified   Annetta Maw, LCSW 02/16/2015, 5:03 PM

## 2015-02-16 NOTE — Progress Notes (Signed)
General Surgery Note  LOS: 2 days   POD -       Assessment/Plan: 1.  Right thoraco-abdominal wall abscess  History of lap chole in 2015  Vancomycin - 10/6 >>>>>  Perc drain - 10/7  Seems to be doing okay now  2. 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 Dementia  -vascular and due to #2 4. HTN  5. Anemia - 10.6 - 02/16/2015 6.  DVT prophylaxis - on hold  Principal Problem:   Abscess of chest [facility]) Active Problems:   HYPERTENSION, BENIGN   Hemiparesis due to old head trauma (HCC)   Chronic back pain   Abdominal fluid collection  Subjective:  Alert.  Ate most of his lunch.  Having no pain. Objective:   Filed Vitals:   02/16/15 0925  BP: 103/60  Pulse: 63  Temp:   Resp: 16     Intake/Output from previous day:  10/07 0701 - 10/08 0700 In: 490 [P.O.:240; IV Piggyback:250] Out: 550 [Urine:375; Drains:175]  Intake/Output this shift:  Total I/O In: 365 [P.O.:360; Other:5] Out: -    Physical Exam:   General: Older WM who is alert and oriented.    HEENT: Normal. Pupils equal. .   Lungs: Clear   Abdomen: Soft.  Drain from right lower chest.  175 cc recorded over the last 24 hours.   Lab Results:    Recent Labs  02/15/15 0410 02/16/15 0524  WBC 7.4 6.8  HGB 9.8* 10.6*  HCT 31.5* 34.9*  PLT 410* 377    BMET   Recent Labs  02/15/15 0410 02/16/15 0524  NA 139 138  K 4.2 4.1  CL 109 105  CO2 25 26  GLUCOSE 88 84  BUN 11 10  CREATININE 0.86 0.87  CALCIUM 8.3* 8.4*    PT/INR  No results for input(s): LABPROT, INR in the last 72 hours.  ABG  No results for input(s): PHART, HCO3 in the last 72 hours.  Invalid input(s): PCO2, PO2   Studies/Results:  Ct Chest W Contrast  02/14/2015   CLINICAL DATA:  Purulent drainage from right chest wall abscess.  EXAM: CT CHEST WITH CONTRAST  TECHNIQUE: Multidetector CT imaging of the chest was performed during intravenous contrast administration.  CONTRAST:  75mL OMNIPAQUE  IOHEXOL 300 MG/ML  SOLN  COMPARISON:  10/09/2013  FINDINGS: Mediastinum/Nodes: Confluent nodules or mass of the right inferior thyroid lobe, 4.8 by 2.8 cm on image 15 series 2 if this has not been previously worked up, thyroid ultrasound would be indicated.  Atherosclerotic calcification of the aortic arch. Large hiatal hernia containing most of the stomach and some of the transverse colon.  Right hilar lymph node 0.8 cm in short axis, image 28 series 2. No other thoracic adenopathy is identified.  Lungs/Pleura: Along the right posterior costophrenic space, and believed to probably be just above the right hemidiaphragm, there is a 10 cm by 11.3 cm by 5.4 cm walled off fluid collection with enhancing margins favoring an abscess. I am suspicious that this may be extending through the seventh intercostal space laterally and probably connecting to the thick walled 3.7 by 2.4 by 4.4 cm subcutaneous abscess/ fluid collection which in turn extends to the skin surface on image 63 of series 2. There is a Wajda non loculated right pleural effusion and associated passive atelectasis. The large hiatal hernia also causes passive atelectasis.  Upper abdomen: The suspected abscess in the posterior costophrenic space mildly indents the posterior  margin of the liver due to extrinsic mass effect. Prior cholecystectomy. Left peripelvic cysts.  Musculoskeletal: Severe bilateral degenerative glenohumeral arthropathy. Old nonunited fracture of the right distal clavicle. Multiple chronic bony fragments in the vicinity of the left [facility] joint.  Old healed left lateral third rib fracture. Incidental lipoma of the right teres minor muscle. Atrophy of the left subscapularis and infraspinatus muscles. Free osteochondral fragments in the right subscapular recess.  IMPRESSION: 1. In the right costophrenic sulcus and believed to be just above the right hemidiaphragm, there is 11 cm complex walled off thickly marginally enhancing fluid collection  suspicious for subpulmonic abscess, with a [place] surrounding pleural effusion. There is also an abscess along the right lateral thoracoabdominal wall extending from the seventh intercostal space towards the pleural space, and this could represent drainage of empyema or connection of the original large abscess into the subcutaneous tissues. The subcutaneous collection is probably draining to the skin. Drainage recommended. 2. Large hiatal hernia containing stomach and transverse colon. 3. Severe chronic arthropathy of the shoulders. 4. Confluent nodules or mass of the right inferior thyroid lobe measuring up to 4.8 cm. If not previously worked up, thyroid ultrasound would be recommended in the nonacute setting.   Electronically Signed   By: Gaylyn Rong M.D.   On: 02/14/2015 13:24   [place] Image Guided Drainage Percut Cath  Peritoneal Retroperit  02/15/2015   CLINICAL DATA:  79 year old male with a history of right upper quadrant fluid collection, concern for abscess.  EXAM: ULTRASOUND GUIDED DRAIN OF RIGHT UPPER QUADRANT ABSCESS  MEDICATIONS: 0.5 mg IV Versed; 25 mcg IV Fentanyl  Total Moderate Sedation Time: 18  PROCEDURE: The procedure, risks, benefits, and alternatives were explained to the patient and the patient's family. Questions regarding the procedure were encouraged and answered. The patient understands and consents to the procedure.  Patient is position in the right anterior oblique position on the stretcher. Ultrasound survey was performed with images stored and sent to PACs.  The right flank was prepped with Betadine in a sterile fashion, and a sterile drape was applied covering the operative field. A sterile gown and sterile gloves were used for the procedure. Local anesthesia was provided with 1% Lidocaine.  Once the patient is prepped and draped sterilely, the skin and subcutaneous tissues were generously infiltrated with 1% lidocaine for local anesthesia. A Cram[place] stab inches was made with 11  blade scalpel.  Under ultrasound guidance, a 12 French drain was advanced into the abscess in the right upper quadrant under trocar technique. Once the tip of the needle was centered within the fluid collection, the drain was advanced off the trocar into the collection. Pigtail catheter is formed.  Approximately 270 cc of purulent fluid was then aspirated. Catheter was attached to a drainage bag, catheter was formed, and a suture was placed for retention suture.  Patient tolerated the procedure well and remained hemodynamically stable throughout.  No complications were encountered and no significant blood loss.  COMPLICATIONS: None.  FINDINGS: Ultrasound survey demonstrates complex fluid collection in the right upper quadrant.  270 cc of purulent fluid removed.  Final image demonstrates no complicating features.  IMPRESSION: Status post ultrasound-guided drain placement into right upper quadrant abscess. 270 cc of purulent fluid aspirated. Sample sent to the lab for analysis.  Signed,  Yvone Neu. Loreta Ave, DO  [facility]  [facility]   Electronically Signed   By: Gilmer Mor D.O.   On: 02/15/2015 17:05  Anti-infectives:   Anti-infectives    Start     Dose/Rate Route Frequency Ordered Stop   02/15/15 0600  vancomycin (VANCOCIN) IVPB 1000 mg/200 mL premix     1,000 mg 200 mL/hr over 60 Minutes Intravenous Every 12 hours 02/14/15 1746     02/15/15 0200  aztreonam (AZACTAM) 2 g in dextrose 5 % 50 mL IVPB     2 g 100 mL/hr over 30 Minutes Intravenous Every 8 hours 02/14/15 1954     02/14/15 1745  vancomycin (VANCOCIN) 1,250 mg in sodium chloride 0.9 % 250 mL IVPB     1,250 mg 166.7 mL/hr over 90 Minutes Intravenous  Once 02/14/15 1740 02/14/15 1949   02/14/15 1730  aztreonam (AZACTAM) 2 g in dextrose 5 % 50 mL IVPB     2 g 100 mL/hr over 30 Minutes Intravenous  Once 02/14/15 1725 02/14/15 1818      Ovidio Kin, MD, FACS Pager: [phone] [facility] Office: [phone] 02/16/2015

## 2015-02-16 NOTE — Progress Notes (Signed)
Pt's wife has called at this time and informed Clinical research associate, that per "[facility]" nurse, the pt's abscess fluid that was sent out to lab has been called back to nurse at [facility] as MRSA+. Pt is currently on Vanc. IVPB -- drain was placed yesterday at this facility (per pt's wife).

## 2015-02-16 NOTE — Progress Notes (Signed)
Patient ID: [name], male   DOB: [birth year], 79 y.o.   MRN: [id number]    Referring Physician(s): CCS   Subjective:  Pt resting quietly; without new c/o   Allergies: Ace inhibitors and Penicillins  Medications: Prior to Admission medications   Medication Sig Start Date End Date Taking? Authorizing Provider  albuterol (PROAIR HFA) 108 (90 BASE) MCG/ACT inhaler Inhale 2 puffs into the lungs every 3 (three) hours as needed for wheezing or shortness of breath.   Yes Historical Provider, MD  ascorbic acid (VITAMIN C) 1000 MG tablet Take 1,000 mg by mouth daily.   Yes Historical Provider, MD  aspirin 325 MG tablet Take 325 mg by mouth daily.   Yes Historical Provider, MD  baclofen (LIORESAL) 20 MG tablet Take 20 mg by mouth every evening.   Yes Historical Provider, MD  cholecalciferol (VITAMIN D) 1000 UNITS tablet Take 1,000 Units by mouth daily.     Yes Historical Provider, MD  clonazePAM (KLONOPIN) 0.5 MG tablet Take 1 tablet (0.5 mg total) by mouth 2 (two) times daily as needed (anxiety). 08/09/13  Yes Alysia Penna, MD  cyanocobalamin 500 MCG tablet Take 500 mcg by mouth daily.   Yes Historical Provider, MD  Dextromethorphan-Quinidine (NUEDEXTA) 20-10 MG CAPS Take 1 capsule by mouth daily.   Yes Historical Provider, MD  docusate sodium (COLACE) 100 MG capsule Take 100 mg by mouth every evening.   Yes Historical Provider, MD  doxycycline (VIBRAMYCIN) 100 MG capsule Take 100 mg by mouth 2 (two) times daily. For 7 days 10-4 to 10-11   Yes Historical Provider, MD  ferrous sulfate 325 (65 FE) MG tablet Take 325 mg by mouth 2 (two) times daily.   Yes Historical Provider, MD  finasteride (PROSCAR) 5 MG tablet Take 5 mg by mouth daily.   Yes Historical Provider, MD  Hypromellose (GENTEAL) 0.3 % SOLN Place 1 drop into both eyes 3 (three) times daily as needed (dry eyes).   Yes Historical Provider, MD  Liniments Chinita Pester) PADS Apply 1 each topically daily as needed (pain). Apply to left knee    Yes Historical Provider, MD  metoprolol succinate (TOPROL-XL) 50 MG 24 hr tablet Take 50 mg by mouth daily. Take with or immediately following a meal.   Yes Historical Provider, MD  mirtazapine (REMERON) 7.5 MG tablet Take 7.5 mg by mouth at bedtime.   Yes Historical Provider, MD  Multiple Vitamin (MULITIVITAMIN WITH MINERALS) TABS Take 1 tablet by mouth daily.     Yes Historical Provider, MD  polyethylene glycol (MIRALAX / GLYCOLAX) packet Take 17 g by mouth daily.   Yes Historical Provider, MD  QUEtiapine (SEROQUEL) 25 MG tablet Take 25 mg by mouth at bedtime.   Yes Historical Provider, MD  Maltodextrin-Xanthan Gum (RESOURCE THICKENUP CLEAR) POWD Take 120 g by mouth as needed. Patient not taking: Reported on 02/14/2015 10/13/13   Geoffry Paradise, MD     Vital Signs: BP 103/60 mmHg  Pulse 63  Temp(Src) 97.4 F (36.3 C) (Axillary)  Resp 16  Ht 6' 0.05" (1.83 m)  Wt 170 lb 10.2 oz (77.4 kg)  BMI 23.11 kg/m2  SpO2 100%  Physical Exam RUQ drain intact, insertion site ok,NT, output 175 cc bloody fluid, cx's pend  Imaging: Ct Chest W Contrast  02/14/2015   CLINICAL DATA:  Purulent drainage from right chest wall abscess.  EXAM: CT CHEST WITH CONTRAST  TECHNIQUE: Multidetector CT imaging of the chest was performed during intravenous contrast administration.  CONTRAST:  75mL  OMNIPAQUE IOHEXOL 300 MG/ML  SOLN  COMPARISON:  10/09/2013  FINDINGS: Mediastinum/Nodes: Confluent nodules or mass of the right inferior thyroid lobe, 4.8 by 2.8 cm on image 15 series 2 if this has not been previously worked up, thyroid ultrasound would be indicated.  Atherosclerotic calcification of the aortic arch. Large hiatal hernia containing most of the stomach and some of the transverse colon.  Right hilar lymph node 0.8 cm in short axis, image 28 series 2. No other thoracic adenopathy is identified.  Lungs/Pleura: Along the right posterior costophrenic space, and believed to probably be just above the right hemidiaphragm,  there is a 10 cm by 11.3 cm by 5.4 cm walled off fluid collection with enhancing margins favoring an abscess. I am suspicious that this may be extending through the seventh intercostal space laterally and probably connecting to the thick walled 3.7 by 2.4 by 4.4 cm subcutaneous abscess/ fluid collection which in turn extends to the skin surface on image 63 of series 2. There is a Dant non loculated right pleural effusion and associated passive atelectasis. The large hiatal hernia also causes passive atelectasis.  Upper abdomen: The suspected abscess in the posterior costophrenic space mildly indents the posterior margin of the liver due to extrinsic mass effect. Prior cholecystectomy. Left peripelvic cysts.  Musculoskeletal: Severe bilateral degenerative glenohumeral arthropathy. Old nonunited fracture of the right distal clavicle. Multiple chronic bony fragments in the vicinity of the left [place] joint.  Old healed left lateral third rib fracture. Incidental lipoma of the right teres minor muscle. Atrophy of the left subscapularis and infraspinatus muscles. Free osteochondral fragments in the right subscapular recess.  IMPRESSION: 1. In the right costophrenic sulcus and believed to be just above the right hemidiaphragm, there is 11 cm complex walled off thickly marginally enhancing fluid collection suspicious for subpulmonic abscess, with a Settles surrounding pleural effusion. There is also an abscess along the right lateral thoracoabdominal wall extending from the seventh intercostal space towards the pleural space, and this could represent drainage of empyema or connection of the original large abscess into the subcutaneous tissues. The subcutaneous collection is probably draining to the skin. Drainage recommended. 2. Large hiatal hernia containing stomach and transverse colon. 3. Severe chronic arthropathy of the shoulders. 4. Confluent nodules or mass of the right inferior thyroid lobe measuring up to 4.8 cm. If  not previously worked up, thyroid ultrasound would be recommended in the nonacute setting.   Electronically Signed   By: Gaylyn Rong M.D.   On: 02/14/2015 13:24   [place] Image Guided Drainage Percut Cath  Peritoneal Retroperit  02/15/2015   CLINICAL DATA:  79 year old male with a history of right upper quadrant fluid collection, concern for abscess.  EXAM: ULTRASOUND GUIDED DRAIN OF RIGHT UPPER QUADRANT ABSCESS  MEDICATIONS: 0.5 mg IV Versed; 25 mcg IV Fentanyl  Total Moderate Sedation Time: 18  PROCEDURE: The procedure, risks, benefits, and alternatives were explained to the patient and the patient's family. Questions regarding the procedure were encouraged and answered. The patient understands and consents to the procedure.  Patient is position in the right anterior oblique position on the stretcher. Ultrasound survey was performed with images stored and sent to PACs.  The right flank was prepped with Betadine in a sterile fashion, and a sterile drape was applied covering the operative field. A sterile gown and sterile gloves were used for the procedure. Local anesthesia was provided with 1% Lidocaine.  Once the patient is prepped and draped sterilely, the skin and subcutaneous  tissues were generously infiltrated with 1% lidocaine for local anesthesia. A [place] stab inches was made with 11 blade scalpel.  Under ultrasound guidance, a 12 French drain was advanced into the abscess in the right upper quadrant under trocar technique. Once the tip of the needle was centered within the fluid collection, the drain was advanced off the trocar into the collection. Pigtail catheter is formed.  Approximately 270 cc of purulent fluid was then aspirated. Catheter was attached to a drainage bag, catheter was formed, and a suture was placed for retention suture.  Patient tolerated the procedure well and remained hemodynamically stable throughout.  No complications were encountered and no significant blood loss.   COMPLICATIONS: None.  FINDINGS: Ultrasound survey demonstrates complex fluid collection in the right upper quadrant.  270 cc of purulent fluid removed.  Final image demonstrates no complicating features.  IMPRESSION: Status post ultrasound-guided drain placement into right upper quadrant abscess. 270 cc of purulent fluid aspirated. Sample sent to the lab for analysis.  Signed,  Yvone Neu. Loreta Ave, DO  [facility]  [facility]   Electronically Signed   By: Gilmer Mor D.O.   On: 02/15/2015 17:05    Labs:  CBC:  Recent Labs  02/14/15 1720 02/15/15 0410 02/16/15 0524  WBC 8.8 7.4 6.8  HGB 10.5* 9.8* 10.6*  HCT 35.1* 31.5* 34.9*  PLT 444* 410* 377    COAGS: No results for input(s): INR, APTT in the last 8760 hours.  BMP:  Recent Labs  02/14/15 1241 02/14/15 1720 02/15/15 0410 02/16/15 0524  NA  --  136 139 138  K  --  4.6 4.2 4.1  CL  --  103 109 105  CO2  --  28 25 26   GLUCOSE  --  131* 88 84  BUN  --  12 11 10   CALCIUM  --  8.6* 8.3* 8.4*  CREATININE 1.10 0.88 0.86 0.87  GFRNONAA  --  >60 >60 >60  GFRAA  --  >60 >60 >60    LIVER FUNCTION TESTS:  Recent Labs  02/15/15 0410  BILITOT 0.4  AST 21  ALT 31  ALKPHOS 65  PROT 5.7*  ALBUMIN 2.5*    Assessment and Plan:  Pt with prior cholecystostomy followed by cholecystectomy 2015 with draining wound RUQ abdomen/lower chest; s/p RUQ fluid collection drainage 10/7; check final cx's; afebrile; hgb 10.6, WBC nl; cont current tx for now; monitor labs, irrigate drain with sterile NS; check f/u CT next week  Signed: D. Jeananne Rama 02/16/2015, 2:32 PM   I spent a total of 15 minutes at the the patient's bedside AND on the patient's hospital floor or unit, greater than 50% of which was counseling/coordinating care for RUQ fluid collection drain

## 2015-02-16 NOTE — Progress Notes (Signed)
[facility] PROGRESS NOTE  [name] ZOX:[id number] DOB: 27-Apr-1936 DOA: 02/14/2015 PCP: [name], MD  Assessment/Plan: 1. ThoracoAbdominal chest wall abscess -at the site of prior GB drain, h/o lap chole 4/16 -per CCS, IR  -s/p US guided drain placement, RUQ,-270cc purulent fluid aspirated -continue broad spectrum Abx -IVF, FU cultures  2. 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 Dementia -vascular and due to #2  4. HTN -continue toprol  5. Anemia -chronic with some worsening could be from dilution -monitor  DVT proph: lovenox resume tomorrow  Code Status: Full Code Family Communication: none at bedside, called and d/w wife Disposition Plan: ALF/home when improved  Procedure: US guided drain placement, RUQ, 94F to gravity. 270cc purulent fluid aspirated   HPI/Subjective: No complaints, no events overnight  Objective: Filed Vitals:   02/16/15 0556  BP: 129/63  Pulse: 58  Temp: 97.4 F (36.3 C)  Resp: 16    Intake/Output Summary (Last 24 hours) at 02/16/15 0926 Last data filed at 02/16/15 0817  Gross per 24 hour  Intake    495 ml  Output    550 ml  Net    -55 ml   Filed Weights   02/14/15 1954  Weight: 77.4 kg (170 lb 10.2 oz)    Exam:   General:  AAO x1, confused  Cardiovascular: S1S2/RRR  Respiratory: CTAB  R lower chest/upper abd wall with B[place] open wound   Abdomen: soft, NT, BS present  Musculoskeletal: no edema c/c     Data Reviewed: Basic Metabolic Panel:  Recent Labs Lab 02/14/15 1241 02/14/15 1720 02/15/15 0410 02/16/15 0524  NA  --  136 139 138  K  --  4.6 4.2 4.1  CL  --  103 109 105  CO2  --  28 25 26   GLUCOSE  --  131* 88 84  BUN  --  12 11 10   CREATININE 1.10 0.88 0.86 0.87  CALCIUM  --  8.6* 8.3* 8.4*   Liver Function Tests:  Recent Labs Lab 02/15/15 0410  AST 21  ALT 31  ALKPHOS 65  BILITOT 0.4  PROT 5.7*  ALBUMIN 2.5*   No results for  input(s): LIPASE, AMYLASE in the last 168 hours. No results for input(s): AMMONIA in the last 168 hours. CBC:  Recent Labs Lab 02/14/15 1720 02/15/15 0410 02/16/15 0524  WBC 8.8 7.4 6.8  NEUTROABS 6.8  --   --   HGB 10.5* 9.8* 10.6*  HCT 35.1* 31.5* 34.9*  MCV 72.8* 72.1* 73.5*  PLT 444* 410* 377   Cardiac Enzymes: No results for input(s): CKTOTAL, CKMB, CKMBINDEX, TROPONINI in the last 168 hours. BNP (last 3 results) No results for input(s): BNP in the last 8760 hours.  ProBNP (last 3 results) No results for input(s): PROBNP in the last 8760 hours.  CBG: No results for input(s): GLUCAP in the last 168 hours.  Recent Results (from the past 240 hour(s))  Culture, routine-abscess     Status: None (Preliminary result)   Collection Time: 02/15/15  3:58 PM  Result Value Ref Range Status   Specimen Description ABSCESS THORACOABDOMINAL WALL  Final   Special Requests SYRINGE  Final   Gram Stain PENDING  Incomplete   Culture NO GROWTH Performed at [employer] [facility]   Final   Report Status PENDING  Incomplete     Studies: Ct Chest W Contrast  02/14/2015   CLINICAL DATA:  Purulent drainage from right chest wall abscess.  EXAM: CT CHEST WITH CONTRAST  TECHNIQUE: Multidetector CT imaging of the chest was performed during intravenous contrast administration.  CONTRAST:  75mL OMNIPAQUE IOHEXOL 300 MG/ML  SOLN  COMPARISON:  10/09/2013  FINDINGS: Mediastinum/Nodes: Confluent nodules or mass of the right inferior thyroid lobe, 4.8 by 2.8 cm on image 15 series 2 if this has not been previously worked up, thyroid ultrasound would be indicated.  Atherosclerotic calcification of the aortic arch. Large hiatal hernia containing most of the stomach and some of the transverse colon.  Right hilar lymph node 0.8 cm in short axis, image 28 series 2. No other thoracic adenopathy is identified.  Lungs/Pleura: Along the right posterior costophrenic space, and believed to probably be just above the right  hemidiaphragm, there is a 10 cm by 11.3 cm by 5.4 cm walled off fluid collection with enhancing margins favoring an abscess. I am suspicious that this may be extending through the seventh intercostal space laterally and probably connecting to the thick walled 3.7 by 2.4 by 4.4 cm subcutaneous abscess/ fluid collection which in turn extends to the skin surface on image 63 of series 2. There is a Ricketson non loculated right pleural effusion and associated passive atelectasis. The large hiatal hernia also causes passive atelectasis.  Upper abdomen: The suspected abscess in the posterior costophrenic space mildly indents the posterior margin of the liver due to extrinsic mass effect. Prior cholecystectomy. Left peripelvic cysts.  Musculoskeletal: Severe bilateral degenerative glenohumeral arthropathy. Old nonunited fracture of the right distal clavicle. Multiple chronic bony fragments in the vicinity of the left [facility] joint.  Old healed left lateral third rib fracture. Incidental lipoma of the right teres minor muscle. Atrophy of the left subscapularis and infraspinatus muscles. Free osteochondral fragments in the right subscapular recess.  IMPRESSION: 1. In the right costophrenic sulcus and believed to be just above the right hemidiaphragm, there is 11 cm complex walled off thickly marginally enhancing fluid collection suspicious for subpulmonic abscess, with a [place] surrounding pleural effusion. There is also an abscess along the right lateral thoracoabdominal wall extending from the seventh intercostal space towards the pleural space, and this could represent drainage of empyema or connection of the original large abscess into the subcutaneous tissues. The subcutaneous collection is probably draining to the skin. Drainage recommended. 2. Large hiatal hernia containing stomach and transverse colon. 3. Severe chronic arthropathy of the shoulders. 4. Confluent nodules or mass of the right inferior thyroid lobe measuring up  to 4.8 cm. If not previously worked up, thyroid ultrasound would be recommended in the nonacute setting.   Electronically Signed   By: Gaylyn Rong M.D.   On: 02/14/2015 13:24   [place] Image Guided Drainage Percut Cath  Peritoneal Retroperit  02/15/2015   CLINICAL DATA:  79 year old male with a history of right upper quadrant fluid collection, concern for abscess.  EXAM: ULTRASOUND GUIDED DRAIN OF RIGHT UPPER QUADRANT ABSCESS  MEDICATIONS: 0.5 mg IV Versed; 25 mcg IV Fentanyl  Total Moderate Sedation Time: 18  PROCEDURE: The procedure, risks, benefits, and alternatives were explained to the patient and the patient's family. Questions regarding the procedure were encouraged and answered. The patient understands and consents to the procedure.  Patient is position in the right anterior oblique position on the stretcher. Ultrasound survey was performed with images stored and sent to PACs.  The right flank was prepped with Betadine in a sterile fashion, and a sterile drape was applied covering the operative field. A sterile gown and sterile gloves were used  for the procedure. Local anesthesia was provided with 1% Lidocaine.  Once the patient is prepped and draped sterilely, the skin and subcutaneous tissues were generously infiltrated with 1% lidocaine for local anesthesia. A [place] stab inches was made with 11 blade scalpel.  Under ultrasound guidance, a 12 French drain was advanced into the abscess in the right upper quadrant under trocar technique. Once the tip of the needle was centered within the fluid collection, the drain was advanced off the trocar into the collection. Pigtail catheter is formed.  Approximately 270 cc of purulent fluid was then aspirated. Catheter was attached to a drainage bag, catheter was formed, and a suture was placed for retention suture.  Patient tolerated the procedure well and remained hemodynamically stable throughout.  No complications were encountered and no significant blood  loss.  COMPLICATIONS: None.  FINDINGS: Ultrasound survey demonstrates complex fluid collection in the right upper quadrant.  270 cc of purulent fluid removed.  Final image demonstrates no complicating features.  IMPRESSION: Status post ultrasound-guided drain placement into right upper quadrant abscess. 270 cc of purulent fluid aspirated. Sample sent to the lab for analysis.  Signed,  Yvone Neu. Loreta Ave, DO  [facility]  [facility]   Electronically Signed   By: Gilmer Mor D.O.   On: 02/15/2015 17:05    Scheduled Meds: . aspirin  325 mg Oral Daily  . aztreonam  2 g Intravenous Q8H  . baclofen  20 mg Oral QPM  . cholecalciferol  1,000 Units Oral Daily  . cyanocobalamin  500 mcg Oral Daily  . Dextromethorphan-Quinidine  1 capsule Oral Daily  . docusate sodium  100 mg Oral QPM  . ferrous sulfate  325 mg Oral BID WC  . finasteride  5 mg Oral Daily  . metoprolol succinate  50 mg Oral Daily  . mirtazapine  7.5 mg Oral QHS  . multivitamin with minerals  1 tablet Oral Daily  . polyethylene glycol  17 g Oral Daily  . QUEtiapine  25 mg Oral QHS  . vancomycin  1,000 mg Intravenous Q12H  . ascorbic acid  1,000 mg Oral Daily   Continuous Infusions:  Antibiotics Given (last 72 hours)    Date/Time Action Medication Dose Rate   02/15/15 0125 Given   aztreonam (AZACTAM) 2 g in dextrose 5 % 50 mL IVPB 2 g 100 mL/hr   02/15/15 0547 Given   vancomycin (VANCOCIN) IVPB 1000 mg/200 mL premix 1,000 mg 200 mL/hr   02/15/15 1005 Given   aztreonam (AZACTAM) 2 g in dextrose 5 % 50 mL IVPB 2 g 100 mL/hr   02/15/15 1848 Given   aztreonam (AZACTAM) 2 g in dextrose 5 % 50 mL IVPB 2 g 100 mL/hr   02/15/15 1928 Given   vancomycin (VANCOCIN) IVPB 1000 mg/200 mL premix 1,000 mg 200 mL/hr   02/16/15 0141 Given   aztreonam (AZACTAM) 2 g in dextrose 5 % 50 mL IVPB 2 g 100 mL/hr   02/16/15 0511 Given   vancomycin (VANCOCIN) IVPB 1000 mg/200 mL premix 1,000 mg 200 mL/hr    02/16/15 0912 Given   aztreonam (AZACTAM) 2 g in dextrose 5 % 50 mL IVPB 2 g 100 mL/hr      Principal Problem:   Abscess of chest (HCC) Active Problems:   HYPERTENSION, BENIGN   Hemiparesis due to old head trauma (HCC)   Chronic back pain   Abdominal fluid collection    Time spent:    Sienna Stonehocker  [facility] Pager [phone]. If 7PM-7AM, please contact night-coverage at [email or web address], password [facility] 02/16/2015, 9:26 AM  LOS: 2 days

## 2015-02-17 LAB — MRSA PCR SCREENING: MRSA by PCR: NEGATIVE

## 2015-02-17 LAB — VANCOMYCIN, TROUGH: Vancomycin Tr: 27 ug/mL (ref 10.0–20.0)

## 2015-02-17 MED ORDER — VANCOMYCIN HCL 10 G IV SOLR: 1250.0000 mg | INTRAVENOUS | Status: DC

## 2015-02-17 NOTE — Progress Notes (Signed)
This shift vancomycin stopped per pharmacy due to critical lab value of 27. Attending notified

## 2015-02-17 NOTE — Progress Notes (Signed)
Patient ID: [name], male   DOB: 1935-05-31, 79 y.o.   MRN: [id number]    Subjective: Wife and removed. He is doing well without pain or other complaints. Good appetite. She is upset that the nursing home called and said that he had MRSA on culture is done there.  Objective: Vital signs in last 24 hours: Temp:  [97.5 F (36.4 C)-97.6 F (36.4 C)] 97.5 F (36.4 C) (10/09 0624) Pulse Rate:  [50-66] 66 (10/09 1100) Resp:  [17-18] 18 (10/09 0624) BP: (101-131)/(63-71) 101/64 mmHg (10/09 1100) SpO2:  [98 %-99 %] 98 % (10/09 0624) Last BM Date: 02/15/15  Intake/Output from previous day: 10/08 0701 - 10/09 0700 In: 850 [P.O.:840] Out: 125 [Drains:125] Intake/Output this shift: Total I/O In: 240 [P.O.:240] Out: 100 [Urine:100]  General appearance: alert and no distress Chest wall: no tenderness, drain in place with serosanguineous drainage. No erythema or swelling or tenderness.  Lab Results:   Recent Labs  02/15/15 0410 02/16/15 0524  WBC 7.4 6.8  HGB 9.8* 10.6*  HCT 31.5* 34.9*  PLT 410* 377   BMET  Recent Labs  02/15/15 0410 02/16/15 0524  NA 139 138  K 4.2 4.1  CL 109 105  CO2 25 26  GLUCOSE 88 84  BUN 11 10  CREATININE 0.86 0.87  CALCIUM 8.3* 8.4*   Results for orders placed or performed during the hospital encounter of 02/14/15  Culture, routine-abscess     Status: None (Preliminary result)   Collection Time: 02/15/15  3:58 PM  Result Value Ref Range Status   Specimen Description ABSCESS THORACOABDOMINAL WALL  Final   Special Requests SYRINGE  Final   Gram Stain   Final    MODERATE WBC PRESENT,BOTH PMN AND MONONUCLEAR NO SQUAMOUS EPITHELIAL CELLS SEEN FEW GRAM POSITIVE COCCI IN PAIRS MODERATE GRAM POSITIVE COCCI IN CLUSTERS    Culture   Final    Culture reincubated for better growth Performed at [facility]    Report Status PENDING  Incomplete     Studies/Results: [place] Image Guided Drainage Percut Cath  Peritoneal  Retroperit  02/15/2015   CLINICAL DATA:  79 year old male with a history of right upper quadrant fluid collection, concern for abscess.  EXAM: ULTRASOUND GUIDED DRAIN OF RIGHT UPPER QUADRANT ABSCESS  MEDICATIONS: 0.5 mg IV Versed; 25 mcg IV Fentanyl  Total Moderate Sedation Time: 18  PROCEDURE: The procedure, risks, benefits, and alternatives were explained to the patient and the patient's family. Questions regarding the procedure were encouraged and answered. The patient understands and consents to the procedure.  Patient is position in the right anterior oblique position on the stretcher. Ultrasound survey was performed with images stored and sent to PACs.  The right flank was prepped with Betadine in a sterile fashion, and a sterile drape was applied covering the operative field. A sterile gown and sterile gloves were used for the procedure. Local anesthesia was provided with 1% Lidocaine.  Once the patient is prepped and draped sterilely, the skin and subcutaneous tissues were generously infiltrated with 1% lidocaine for local anesthesia. A [place] stab inches was made with 11 blade scalpel.  Under ultrasound guidance, a 12 French drain was advanced into the abscess in the right upper quadrant under trocar technique. Once the tip of the needle was centered within the fluid collection, the drain was advanced off the trocar into the collection. Pigtail catheter is formed.  Approximately 270 cc of purulent fluid was then aspirated. Catheter was attached to a drainage  bag, catheter was formed, and a suture was placed for retention suture.  Patient tolerated the procedure well and remained hemodynamically stable throughout.  No complications were encountered and no significant blood loss.  COMPLICATIONS: None.  FINDINGS: Ultrasound survey demonstrates complex fluid collection in the right upper quadrant.  270 cc of purulent fluid removed.  Final image demonstrates no complicating features.  IMPRESSION: Status post  ultrasound-guided drain placement into right upper quadrant abscess. 270 cc of purulent fluid aspirated. Sample sent to the lab for analysis.  Signed,  Yvone Neu. Loreta Ave, DO  [facility]  [facility]   Electronically Signed   By: Gilmer Mor D.O.   On: 02/15/2015 17:05    Anti-infectives: Anti-infectives    Start     Dose/Rate Route Frequency Ordered Stop   02/17/15 2000  vancomycin (VANCOCIN) 1,250 mg in sodium chloride 0.9 % 250 mL IVPB  Status:  Discontinued     1,250 mg 166.7 mL/hr over 90 Minutes Intravenous Every 24 hours 02/17/15 0638 02/17/15 0824   02/15/15 0600  vancomycin (VANCOCIN) IVPB 1000 mg/200 mL premix  Status:  Discontinued     1,000 mg 200 mL/hr over 60 Minutes Intravenous Every 12 hours 02/14/15 1746 02/17/15 0638   02/15/15 0200  aztreonam (AZACTAM) 2 g in dextrose 5 % 50 mL IVPB     2 g 100 mL/hr over 30 Minutes Intravenous Every 8 hours 02/14/15 1954     02/14/15 1745  vancomycin (VANCOCIN) 1,250 mg in sodium chloride 0.9 % 250 mL IVPB     1,250 mg 166.7 mL/hr over 90 Minutes Intravenous  Once 02/14/15 1740 02/14/15 1949   02/14/15 1730  aztreonam (AZACTAM) 2 g in dextrose 5 % 50 mL IVPB     2 g 100 mL/hr over 30 Minutes Intravenous  Once 02/14/15 1725 02/14/15 1818      Assessment/Plan: Status post drainage chest wall abscess secondary to  Previously placed percutaneous cholecystostomy tube. Clinically doing well. Gram stain shows gram-positive cocci in clusters. Secondhand unconfirmed report of MRSA on culture at nursing home. Responding well to current treatment.    LOS: 3 days    Lillar Bianca T 02/17/2015

## 2015-02-17 NOTE — Progress Notes (Signed)
ANTIBIOTIC CONSULT NOTE - F/u  Pharmacy Consult for Vancomycin Indication: abscess  Allergies  Allergen Reactions  . Ace Inhibitors Swelling  . Penicillins Swelling    Has patient had a PCN reaction causing immediate rash, facial/tongue/throat swelling, SOB or lightheadedness with hypotension: yes Has patient had a PCN reaction causing severe rash involving mucus membranes or skin necrosis: unknown Has patient had a PCN reaction that required hospitalization no Has patient had a PCN reaction occurring within the last 10 years: no If all of the above answers are "NO", then may proceed with Cephalosporin use.     Patient Measurements: Height: 6' 0.05" (183 cm) Weight: 170 lb 10.2 oz (77.4 kg) IBW/kg (Calculated) : 77.71 Adjusted Body Weight:   Vital Signs: Temp: 97.5 F (36.4 C) (10/09 0624) Temp Source: Oral (10/09 0624) BP: 129/63 mmHg (10/09 0624) Pulse Rate: 52 (10/09 0624) Intake/Output from previous day: 10/08 0701 - 10/09 0700 In: 850 [P.O.:840] Out: 125 [Drains:125] Intake/Output from this shift: Total I/O In: -  Out: 50 [Drains:50]  Labs:  Recent Labs  02/14/15 1720 02/15/15 0410 02/16/15 0524  WBC 8.8 7.4 6.8  HGB 10.5* 9.8* 10.6*  PLT 444* 410* 377  CREATININE 0.88 0.86 0.87   Estimated Creatinine Clearance: 77.8 mL/min (by C-G formula based on Cr of 0.87).  Recent Labs  02/17/15 0523  VANCOTROUGH 27*     Microbiology: Recent Results (from the past 720 hour(s))  Culture, routine-abscess     Status: None (Preliminary result)   Collection Time: 02/15/15  3:58 PM  Result Value Ref Range Status   Specimen Description ABSCESS THORACOABDOMINAL WALL  Final   Special Requests SYRINGE  Final   Gram Stain   Final    MODERATE WBC PRESENT,BOTH PMN AND MONONUCLEAR NO SQUAMOUS EPITHELIAL CELLS SEEN FEW GRAM POSITIVE COCCI IN PAIRS MODERATE GRAM POSITIVE COCCI IN CLUSTERS    Culture NO GROWTH Performed at [facility]   Final   Report  Status PENDING  Incomplete    Medical History: Past Medical History  Diagnosis Date  . Hemiplegia (HCC) 09/07/1970    "horse fell on him"  . Vertigo   . Hypertension   . TBI (traumatic brain injury) (HCC) 09/07/1970    WITH LEFT HEMISPHERIC BLEED  . High cholesterol     "not on RX; taken off d/t vertigo fall 2014"  . Chronic pain     lower back "hasn't complained in months" (09/07/2013)  . Dementia   . Swelling of upper lip     tongue  . Weakness generalized 12/22/2012    "not a problem lately" (09/07/2013)  . Dementia with behavioral disturbance 04/18/2013  . Cholecystitis   . H/O hiatal hernia   . Arthritis     "fingers" (09/07/2013)  . Clonus     "RLE" "hadn't bothered him for awhile; at least 6 months" (09/07/2013)  . Familial tremor   . Stroke [facility])      Assessment: 32 yoM from nursing facility was seen by [facility] for draining abscess underneath arm.  Will be admitted for percutaneous drain placement and antibiotics.  Pharmacy consulted to start vancomycin. Aztreonam x 1 ordered in ED.  Afebrile, WBC pending, CrCl~64 ml/min using old weight of 82 kg and SCr 1  10/9 VT=27 mg/L- drawn b/f 0600 dose given, RN did infuse ~ 1/2 of 0600 dose.  Scr stable.  Goal of Therapy:  Vancomycin trough level 15-20 mcg/ml  Plan:  1. Change Vancomycin to  IV q24h next @ 2000  tonight 2.  F/u additional  trough levels as indicated, clinical course.  Susanne Greenhouse R 02/17/2015,6:34 AM

## 2015-02-17 NOTE — Progress Notes (Signed)
[facility] PROGRESS NOTE  [name] ZOX:[id number] DOB: [birth year] DOA: 02/14/2015 PCP: [name], MD  Assessment/Plan: 1. ThoracoAbdominal chest wall abscess -at the site of prior GB drain, h/o lap chole 4/16 -per [facility], IR  -s/p US guided drain placement, RUQ,-270cc purulent fluid aspirated -remains on broad spectrum Abx, stable/afebrile -cut down IVF, Cultures-reincubated -if stays negative will d/w ID tomorrow  2. 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 Dementia -vascular and due to #2  4. HTN -continue toprol  5. Anemia -chronic with some worsening could be from dilution -monitor  DVT proph: hold  lovenox , due to bloody drainage from drain  Code Status: Full Code Family Communication: none at bedside, called and d/w wife 10/8 Disposition Plan: ALF/home when improved  Procedure: US guided drain placement, RUQ, 14F to gravity. 270cc purulent fluid aspirated   HPI/Subjective: No complaints, no events overnight  Objective: Filed Vitals:   02/17/15 0624  BP: 129/63  Pulse: 52  Temp: 97.5 F (36.4 C)  Resp: 18    Intake/Output Summary (Last 24 hours) at 02/17/15 1036 Last data filed at 02/17/15 0912  Gross per 24 hour  Intake    845 ml  Output    225 ml  Net    620 ml   Filed Weights   02/14/15 1954  Weight: 77.4 kg (170 lb 10.2 oz)    Exam:   General:  AAO x1, pleasant and confused  Cardiovascular: S1S2/RRR  Respiratory: CTAB  R lower chest/upper abd wall with drain, bloody output  Abdomen: soft, NT, BS present  Musculoskeletal: no edema c/c     Data Reviewed: Basic Metabolic Panel:  Recent Labs Lab 02/14/15 1241 02/14/15 1720 02/15/15 0410 02/16/15 0524  NA  --  136 139 138  K  --  4.6 4.2 4.1  CL  --  103 109 105  CO2  --  28 25 26   GLUCOSE  --  131* 88 84  BUN  --  12 11 10   CREATININE 1.10 0.88 0.86 0.87  CALCIUM  --  8.6* 8.3* 8.4*   Liver Function  Tests:  Recent Labs Lab 02/15/15 0410  AST 21  ALT 31  ALKPHOS 65  BILITOT 0.4  PROT 5.7*  ALBUMIN 2.5*   No results for input(s): LIPASE, AMYLASE in the last 168 hours. No results for input(s): AMMONIA in the last 168 hours. CBC:  Recent Labs Lab 02/14/15 1720 02/15/15 0410 02/16/15 0524  WBC 8.8 7.4 6.8  NEUTROABS 6.8  --   --   HGB 10.5* 9.8* 10.6*  HCT 35.1* 31.5* 34.9*  MCV 72.8* 72.1* 73.5*  PLT 444* 410* 377   Cardiac Enzymes: No results for input(s): CKTOTAL, CKMB, CKMBINDEX, TROPONINI in the last 168 hours. BNP (last 3 results) No results for input(s): BNP in the last 8760 hours.  ProBNP (last 3 results) No results for input(s): PROBNP in the last 8760 hours.  CBG: No results for input(s): GLUCAP in the last 168 hours.  Recent Results (from the past 240 hour(s))  Culture, routine-abscess     Status: None (Preliminary result)   Collection Time: 02/15/15  3:58 PM  Result Value Ref Range Status   Specimen Description ABSCESS THORACOABDOMINAL WALL  Final   Special Requests SYRINGE  Final   Gram Stain   Final    MODERATE WBC PRESENT,BOTH PMN AND MONONUCLEAR NO SQUAMOUS EPITHELIAL CELLS SEEN FEW GRAM POSITIVE COCCI IN PAIRS MODERATE GRAM POSITIVE COCCI IN  CLUSTERS    Culture   Final    Culture reincubated for better growth Performed at [facility]    Report Status PENDING  Incomplete     Studies: [place] Image Guided Drainage Percut Cath  Peritoneal Retroperit  02/15/2015   CLINICAL DATA:  79 year old male with a history of right upper quadrant fluid collection, concern for abscess.  EXAM: ULTRASOUND GUIDED DRAIN OF RIGHT UPPER QUADRANT ABSCESS  MEDICATIONS: 0.5 mg IV Versed; 25 mcg IV Fentanyl  Total Moderate Sedation Time: 18  PROCEDURE: The procedure, risks, benefits, and alternatives were explained to the patient and the patient's family. Questions regarding the procedure were encouraged and answered. The patient understands and consents to the  procedure.  Patient is position in the right anterior oblique position on the stretcher. Ultrasound survey was performed with images stored and sent to PACs.  The right flank was prepped with Betadine in a sterile fashion, and a sterile drape was applied covering the operative field. A sterile gown and sterile gloves were used for the procedure. Local anesthesia was provided with 1% Lidocaine.  Once the patient is prepped and draped sterilely, the skin and subcutaneous tissues were generously infiltrated with 1% lidocaine for local anesthesia. A [place] stab inches was made with 11 blade scalpel.  Under ultrasound guidance, a 12 French drain was advanced into the abscess in the right upper quadrant under trocar technique. Once the tip of the needle was centered within the fluid collection, the drain was advanced off the trocar into the collection. Pigtail catheter is formed.  Approximately 270 cc of purulent fluid was then aspirated. Catheter was attached to a drainage bag, catheter was formed, and a suture was placed for retention suture.  Patient tolerated the procedure well and remained hemodynamically stable throughout.  No complications were encountered and no significant blood loss.  COMPLICATIONS: None.  FINDINGS: Ultrasound survey demonstrates complex fluid collection in the right upper quadrant.  270 cc of purulent fluid removed.  Final image demonstrates no complicating features.  IMPRESSION: Status post ultrasound-guided drain placement into right upper quadrant abscess. 270 cc of purulent fluid aspirated. Sample sent to the lab for analysis.  Signed,  Yvone Neu. Loreta Ave, DO  [facility]  [facility]   Electronically Signed   By: Gilmer Mor D.O.   On: 02/15/2015 17:05    Scheduled Meds: . aspirin  325 mg Oral Daily  . aztreonam  2 g Intravenous Q8H  . baclofen  20 mg Oral QPM  . cholecalciferol  1,000 Units Oral Daily  . cyanocobalamin  500 mcg Oral  Daily  . Dextromethorphan-Quinidine  1 capsule Oral Daily  . docusate sodium  100 mg Oral QPM  . ferrous sulfate  325 mg Oral BID WC  . finasteride  5 mg Oral Daily  . metoprolol succinate  50 mg Oral Daily  . mirtazapine  7.5 mg Oral QHS  . multivitamin with minerals  1 tablet Oral Daily  . polyethylene glycol  17 g Oral Daily  . QUEtiapine  25 mg Oral QHS  . ascorbic acid  1,000 mg Oral Daily   Continuous Infusions:  Antibiotics Given (last 72 hours)    Date/Time Action Medication Dose Rate   02/15/15 0125 Given   aztreonam (AZACTAM) 2 g in dextrose 5 % 50 mL IVPB 2 g 100 mL/hr   02/15/15 0547 Given   vancomycin (VANCOCIN) IVPB 1000 mg/200 mL premix 1,000 mg 200 mL/hr   02/15/15 1005 Given  aztreonam (AZACTAM) 2 g in dextrose 5 % 50 mL IVPB 2 g 100 mL/hr   02/15/15 1848 Given   aztreonam (AZACTAM) 2 g in dextrose 5 % 50 mL IVPB 2 g 100 mL/hr   02/15/15 1928 Given   vancomycin (VANCOCIN) IVPB 1000 mg/200 mL premix 1,000 mg 200 mL/hr   02/16/15 0141 Given   aztreonam (AZACTAM) 2 g in dextrose 5 % 50 mL IVPB 2 g 100 mL/hr   02/16/15 0511 Given   vancomycin (VANCOCIN) IVPB 1000 mg/200 mL premix 1,000 mg 200 mL/hr   02/16/15 0912 Given   aztreonam (AZACTAM) 2 g in dextrose 5 % 50 mL IVPB 2 g 100 mL/hr   02/16/15 1715 Given   aztreonam (AZACTAM) 2 g in dextrose 5 % 50 mL IVPB 2 g 100 mL/hr   02/16/15 1759 Given   vancomycin (VANCOCIN) IVPB 1000 mg/200 mL premix 1,000 mg 200 mL/hr   02/17/15 0130 Given   aztreonam (AZACTAM) 2 g in dextrose 5 % 50 mL IVPB 2 g 100 mL/hr   02/17/15 0604 Given   vancomycin (VANCOCIN) IVPB 1000 mg/200 mL premix 1,000 mg 200 mL/hr      Principal Problem:   Abscess of chest [facility]) Active Problems:   HYPERTENSION, BENIGN   Hemiparesis due to old head trauma (HCC)   Chronic back pain   Abdominal fluid collection    Time spent:    [facility]  [facility] Pager [phone]. If 7PM-7AM, please contact night-coverage at  [email or web address], password [facility] 02/17/2015, 10:36 AM  LOS: 3 days

## 2015-02-17 NOTE — Progress Notes (Signed)
CRITICAL VALUE ALERT  Critical value received:  Vancomycin 27  Date of notification:  [id number]  Time of notification:  0623  Critical value read back:no  Nurse who received alert:  huey  MD notified (1st page):  schorr  Time of first page:  0630  MD notified (2nd page):na  Time of second page:na  Responding MD:  attending  Time MD responded:  [phone]

## 2015-02-17 NOTE — Progress Notes (Signed)
Pt from nursing facility. MRSA pcr not done on admission. Spoke with Cherrie from infection control and asked if pcr could still be done late into admission. Cherrie said it was still ok to perform testing. Testing done. See results. Contact precautions already ordered by Dr. Johna Sheriff prior to performing pcr testing. (see preliminary result for abscess culture). Pt remains on contact precautions at this time. Vwilliams, rn.

## 2015-02-18 LAB — CBC
HCT: 37.4 % — ABNORMAL LOW (ref 39.0–52.0)
HEMOGLOBIN: 11.5 g/dL — AB (ref 13.0–17.0)
MCH: 22.6 pg — AB (ref 26.0–34.0)
MCHC: 30.7 g/dL (ref 30.0–36.0)
MCV: 73.6 fL — AB (ref 78.0–100.0)
Platelets: 410 10*3/uL — ABNORMAL HIGH (ref 150–400)
RBC: 5.08 MIL/uL (ref 4.22–5.81)
RDW: 17.8 % — ABNORMAL HIGH (ref 11.5–15.5)
WBC: 9.4 10*3/uL (ref 4.0–10.5)

## 2015-02-18 MED ORDER — ENOXAPARIN SODIUM 40 MG/0.4ML ~~LOC~~ SOLN
40.0000 mg | Freq: Every day | SUBCUTANEOUS | Status: DC
  Administered 2015-02-18 – 2015-02-20 (×3): 40 mg via SUBCUTANEOUS
  Filled 2015-02-18 (×3): qty 0.4

## 2015-02-18 MED ORDER — SODIUM CHLORIDE 0.9 % IV SOLN
1250.0000 mg | Freq: Every day | INTRAVENOUS | Status: DC
  Administered 2015-02-18 – 2015-02-20 (×3): 1250 mg via INTRAVENOUS
  Filled 2015-02-18 (×3): qty 1250

## 2015-02-18 NOTE — Progress Notes (Signed)
Referring Physician(s): CCS  Chief Complaint: RUQ/Chest wall abscess s/p perc drain 10/7  Subjective: Patient c/o headache and minimal pain at drain site.   Allergies: Ace inhibitors and Penicillins  Medications: Prior to Admission medications   Medication Sig Start Date End Date Taking? Authorizing Provider  albuterol (PROAIR HFA) 108 (90 BASE) MCG/ACT inhaler Inhale 2 puffs into the lungs every 3 (three) hours as needed for wheezing or shortness of breath.   Yes Historical Provider, MD  ascorbic acid (VITAMIN C) 1000 MG tablet Take 1,000 mg by mouth daily.   Yes Historical Provider, MD  aspirin 325 MG tablet Take 325 mg by mouth daily.   Yes Historical Provider, MD  baclofen (LIORESAL) 20 MG tablet Take 20 mg by mouth every evening.   Yes Historical Provider, MD  cholecalciferol (VITAMIN D) 1000 UNITS tablet Take 1,000 Units by mouth daily.     Yes Historical Provider, MD  clonazePAM (KLONOPIN) 0.5 MG tablet Take 1 tablet (0.5 mg total) by mouth 2 (two) times daily as needed (anxiety). 08/09/13  Yes Alysia Penna, MD  cyanocobalamin 500 MCG tablet Take 500 mcg by mouth daily.   Yes Historical Provider, MD  Dextromethorphan-Quinidine (NUEDEXTA) 20-10 MG CAPS Take 1 capsule by mouth daily.   Yes Historical Provider, MD  docusate sodium (COLACE) 100 MG capsule Take 100 mg by mouth every evening.   Yes Historical Provider, MD  doxycycline (VIBRAMYCIN) 100 MG capsule Take 100 mg by mouth 2 (two) times daily. For 7 days 10-4 to 10-11   Yes Historical Provider, MD  ferrous sulfate 325 (65 FE) MG tablet Take 325 mg by mouth 2 (two) times daily.   Yes Historical Provider, MD  finasteride (PROSCAR) 5 MG tablet Take 5 mg by mouth daily.   Yes Historical Provider, MD  Hypromellose (GENTEAL) 0.3 % SOLN Place 1 drop into both eyes 3 (three) times daily as needed (dry eyes).   Yes Historical Provider, MD  Liniments Chinita Pester) PADS Apply 1 each topically daily as needed (pain). Apply to left knee    Yes Historical Provider, MD  metoprolol succinate (TOPROL-XL) 50 MG 24 hr tablet Take 50 mg by mouth daily. Take with or immediately following a meal.   Yes Historical Provider, MD  mirtazapine (REMERON) 7.5 MG tablet Take 7.5 mg by mouth at bedtime.   Yes Historical Provider, MD  Multiple Vitamin (MULITIVITAMIN WITH MINERALS) TABS Take 1 tablet by mouth daily.     Yes Historical Provider, MD  polyethylene glycol (MIRALAX / GLYCOLAX) packet Take 17 g by mouth daily.   Yes Historical Provider, MD  QUEtiapine (SEROQUEL) 25 MG tablet Take 25 mg by mouth at bedtime.   Yes Historical Provider, MD  Maltodextrin-Xanthan Gum (RESOURCE THICKENUP CLEAR) POWD Take 120 g by mouth as needed. Patient not taking: Reported on 02/14/2015 10/13/13   Geoffry Paradise, MD   Vital Signs: BP 126/66 mmHg  Pulse 61  Temp(Src) 97.7 F (36.5 C) (Oral)  Resp 18  Ht 6' 0.05" (1.83 m)  Wt 170 lb 10.2 oz (77.4 kg)  BMI 23.11 kg/m2  SpO2 100%  Physical Exam General: Awake, NAD Chest/Abd: RUQ drain intact with minimal TTP, bloody/serosang output 50 cc in bag  Imaging: Ct Chest W Contrast  02/14/2015   CLINICAL DATA:  Purulent drainage from right chest wall abscess.  EXAM: CT CHEST WITH CONTRAST  TECHNIQUE: Multidetector CT imaging of the chest was performed during intravenous contrast administration.  CONTRAST:  75mL OMNIPAQUE IOHEXOL 300 MG/ML  SOLN  COMPARISON:  10/09/2013  FINDINGS: Mediastinum/Nodes: Confluent nodules or mass of the right inferior thyroid lobe, 4.8 by 2.8 cm on image 15 series 2 if this has not been previously worked up, thyroid ultrasound would be indicated.  Atherosclerotic calcification of the aortic arch. Large hiatal hernia containing most of the stomach and some of the transverse colon.  Right hilar lymph node 0.8 cm in short axis, image 28 series 2. No other thoracic adenopathy is identified.  Lungs/Pleura: Along the right posterior costophrenic space, and believed to probably be just above the  right hemidiaphragm, there is a 10 cm by 11.3 cm by 5.4 cm walled off fluid collection with enhancing margins favoring an abscess. I am suspicious that this may be extending through the seventh intercostal space laterally and probably connecting to the thick walled 3.7 by 2.4 by 4.4 cm subcutaneous abscess/ fluid collection which in turn extends to the skin surface on image 63 of series 2. There is a [place] non loculated right pleural effusion and associated passive atelectasis. The large hiatal hernia also causes passive atelectasis.  Upper abdomen: The suspected abscess in the posterior costophrenic space mildly indents the posterior margin of the liver due to extrinsic mass effect. Prior cholecystectomy. Left peripelvic cysts.  Musculoskeletal: Severe bilateral degenerative glenohumeral arthropathy. Old nonunited fracture of the right distal clavicle. Multiple chronic bony fragments in the vicinity of the left [facility] joint.  Old healed left lateral third rib fracture. Incidental lipoma of the right teres minor muscle. Atrophy of the left subscapularis and infraspinatus muscles. Free osteochondral fragments in the right subscapular recess.  IMPRESSION: 1. In the right costophrenic sulcus and believed to be just above the right hemidiaphragm, there is 11 cm complex walled off thickly marginally enhancing fluid collection suspicious for subpulmonic abscess, with a Shingledecker surrounding pleural effusion. There is also an abscess along the right lateral thoracoabdominal wall extending from the seventh intercostal space towards the pleural space, and this could represent drainage of empyema or connection of the original large abscess into the subcutaneous tissues. The subcutaneous collection is probably draining to the skin. Drainage recommended. 2. Large hiatal hernia containing stomach and transverse colon. 3. Severe chronic arthropathy of the shoulders. 4. Confluent nodules or mass of the right inferior thyroid lobe  measuring up to 4.8 cm. If not previously worked up, thyroid ultrasound would be recommended in the nonacute setting.   Electronically Signed   By: Gaylyn Rong M.D.   On: 02/14/2015 13:24   [place] Image Guided Drainage Percut Cath  Peritoneal Retroperit  02/15/2015   CLINICAL DATA:  79 year old male with a history of right upper quadrant fluid collection, concern for abscess.  EXAM: ULTRASOUND GUIDED DRAIN OF RIGHT UPPER QUADRANT ABSCESS  MEDICATIONS: 0.5 mg IV Versed; 25 mcg IV Fentanyl  Total Moderate Sedation Time: 18  PROCEDURE: The procedure, risks, benefits, and alternatives were explained to the patient and the patient's family. Questions regarding the procedure were encouraged and answered. The patient understands and consents to the procedure.  Patient is position in the right anterior oblique position on the stretcher. Ultrasound survey was performed with images stored and sent to PACs.  The right flank was prepped with Betadine in a sterile fashion, and a sterile drape was applied covering the operative field. A sterile gown and sterile gloves were used for the procedure. Local anesthesia was provided with 1% Lidocaine.  Once the patient is prepped and draped sterilely, the skin and subcutaneous tissues were generously infiltrated with  1% lidocaine for local anesthesia. A [place] stab inches was made with 11 blade scalpel.  Under ultrasound guidance, a 12 French drain was advanced into the abscess in the right upper quadrant under trocar technique. Once the tip of the needle was centered within the fluid collection, the drain was advanced off the trocar into the collection. Pigtail catheter is formed.  Approximately 270 cc of purulent fluid was then aspirated. Catheter was attached to a drainage bag, catheter was formed, and a suture was placed for retention suture.  Patient tolerated the procedure well and remained hemodynamically stable throughout.  No complications were encountered and no  significant blood loss.  COMPLICATIONS: None.  FINDINGS: Ultrasound survey demonstrates complex fluid collection in the right upper quadrant.  270 cc of purulent fluid removed.  Final image demonstrates no complicating features.  IMPRESSION: Status post ultrasound-guided drain placement into right upper quadrant abscess. 270 cc of purulent fluid aspirated. Sample sent to the lab for analysis.  Signed,  Yvone Neu. Loreta Ave, DO  [facility]  [facility] Radiology   Electronically Signed   By: Gilmer Mor D.O.   On: 02/15/2015 17:05    Labs:  CBC:  Recent Labs  02/14/15 1720 02/15/15 0410 02/16/15 0524 02/18/15 0538  WBC 8.8 7.4 6.8 9.4  HGB 10.5* 9.8* 10.6* 11.5*  HCT 35.1* 31.5* 34.9* 37.4*  PLT 444* 410* 377 410*    COAGS: No results for input(s): INR, APTT in the last 8760 hours.  BMP:  Recent Labs  02/14/15 1241 02/14/15 1720 02/15/15 0410 02/16/15 0524  NA  --  136 139 138  K  --  4.6 4.2 4.1  CL  --  103 109 105  CO2  --  GLUCOSE  --  131* 88 84  BUN  --  CALCIUM  --  8.6* 8.3* 8.4*  CREATININE 1.10 0.88 0.86 0.87  GFRNONAA  --  >60 >60 >60  GFRAA  --  >60 >60 >60    LIVER FUNCTION TESTS:  Recent Labs  02/15/15 0410  BILITOT 0.4  AST 21  ALT 31  ALKPHOS 65  PROT 5.7*  ALBUMIN 2.5*    Assessment and Plan: RUQ/chest wall fluid collection S/p perc drain 10/7, Cx Staph Aureus, afebrile, wbc wnl, F/U CT when output < 15 cc/day Plans per primary team and CCS  Signed: Berneta Levins 02/18/2015, 9:43 AM   I spent a total of 15 Minutes at the the patient's bedside AND on the patient's hospital floor or unit, greater than 50% of which was counseling/coordinating care for abscess

## 2015-02-18 NOTE — Progress Notes (Signed)
Patient ID: [name], male   DOB: July 22, 1935, 79 y.o.   MRN: [id number]    Subjective: Pt feels well.  Up on bedside commode.  Ate all of his breakfast this morning.  No pain  Objective: Vital signs in last 24 hours: Temp:  [97.5 F (36.4 C)-97.9 F (36.6 C)] 97.7 F (36.5 C) (10/10 0556) Pulse Rate:  [58-65] 61 (10/10 0556) Resp:  [18] 18 (10/10 0556) BP: (112-131)/(64-77) 126/66 mmHg (10/10 0556) SpO2:  [99 %-100 %] 100 % (10/10 0556) Last BM Date: 02/15/15  Intake/Output from previous day: 10/09 0701 - 10/10 0700 In: 485 [P.O.:480] Out: 500 [Urine:500] Intake/Output this shift: Total I/O In: 240 [P.O.:240] Out: -   PE: Abd: soft, NT, ND, JP drain with cloudy bloody drainage  Lab Results:   Recent Labs  02/16/15 0524 02/18/15 0538  WBC 6.8 9.4  HGB 10.6* 11.5*  HCT 34.9* 37.4*  PLT 377 410*   BMET  Recent Labs  02/16/15 0524  NA 138  K 4.1  CL 105  CO2 26  GLUCOSE 84  BUN 10  CREATININE 0.87  CALCIUM 8.4*   PT/INR No results for input(s): LABPROT, INR in the last 72 hours. CMP     Component Value Date/Time   NA 138 02/16/2015 0524   NA 137 11/30/2012 0000   K 4.1 02/16/2015 0524   CL 105 02/16/2015 0524   CO2 26 02/16/2015 0524   GLUCOSE 84 02/16/2015 0524   GLUCOSE 100* 11/30/2012 0000   BUN 10 02/16/2015 0524   BUN 9 11/30/2012 0000   CREATININE 0.87 02/16/2015 0524   CALCIUM 8.4* 02/16/2015 0524   PROT 5.7* 02/15/2015 0410   ALBUMIN 2.5* 02/15/2015 0410   AST 21 02/15/2015 0410   ALT 31 02/15/2015 0410   ALKPHOS 65 02/15/2015 0410   BILITOT 0.4 02/15/2015 0410   GFRNONAA >60 02/16/2015 0524   GFRAA >60 02/16/2015 0524   Lipase     Component Value Date/Time   LIPASE 8* 08/06/2013 0502       Studies/Results: No results found.  Anti-infectives: Anti-infectives    Start     Dose/Rate Route Frequency Ordered Stop   02/18/15 1000  vancomycin (VANCOCIN) 1,250 mg in sodium chloride 0.9 % 250 mL IVPB     1,250 mg 166.7 mL/hr  over 90 Minutes Intravenous Daily 02/18/15 0854     02/17/15 2000  vancomycin (VANCOCIN) 1,250 mg in sodium chloride 0.9 % 250 mL IVPB  Status:  Discontinued     1,250 mg 166.7 mL/hr over 90 Minutes Intravenous Every 24 hours 02/17/15 0638 02/17/15 0824   02/15/15 0600  vancomycin (VANCOCIN) IVPB 1000 mg/200 mL premix  Status:  Discontinued     1,000 mg 200 mL/hr over 60 Minutes Intravenous Every 12 hours 02/14/15 1746 02/17/15 0638   02/15/15 0200  aztreonam (AZACTAM) 2 g in dextrose 5 % 50 mL IVPB     2 g 100 mL/hr over 30 Minutes Intravenous Every 8 hours 02/14/15 1954     02/14/15 1745  vancomycin (VANCOCIN) 1,250 mg in sodium chloride 0.9 % 250 mL IVPB     1,250 mg 166.7 mL/hr over 90 Minutes Intravenous  Once 02/14/15 1740 02/14/15 1949   02/14/15 1730  aztreonam (AZACTAM) 2 g in dextrose 5 % 50 mL IVPB     2 g 100 mL/hr over 30 Minutes Intravenous  Once 02/14/15 1725 02/14/15 1818       Assessment/Plan  Status post drainage chest wall abscess  secondary to Previously placed percutaneous cholecystostomy tube -patient doing well -CX reveals just stauph aureus right now -no WBC or fevers and tolerating a solid diet. -suspect patient is stable for dc back to facility when medically stable.  He could be treated with oral abx therapy for probably at least 14 days. -I will have him follow up with Dr. Magnus Ivan in the office in 3 weeks.  He will need to follow up with IR for his drain as well.   LOS: 4 days    Jadalee Westcott E 02/18/2015, 11:31 AM Pager: [phone]

## 2015-02-18 NOTE — Progress Notes (Addendum)
[facility] PROGRESS NOTE  [name] EAV:[id number] DOB: [birth year] DOA: 02/14/2015 PCP: [name], MD  Assessment/Plan: 1. ThoracoAbdominal chest wall abscess -at the site of prior GB drain, h/o lap chole 4/16 -per CCS, IR  -s/p US guided drain placement, RUQ,-270cc purulent fluid aspirated -remains on broad spectrum Abx, stable/afebrile, FU imaging when output <15cc per IR -Cx with Staph, await sensitivities -DC aztreonam, continue Vanc, FU sensitivities, also requested fluid Cx report from SNF  2. 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 Dementia -vascular and due to #2  4. HTN -continue toprol  5. Anemia -chronic with some worsening could be from dilution -monitor  DVT proph: resume lovenox  Code Status: Full Code Family Communication: none at bedside, called and d/w wife 10/8 Disposition Plan: ALF/home when improved  Procedure: US guided drain placement, RUQ, 69F to gravity. 270cc purulent fluid aspirated   HPI/Subjective: No complaints, no events overnight  Objective: Filed Vitals:   02/18/15 0556  BP: 126/66  Pulse: 61  Temp: 97.7 F (36.5 C)  Resp: 18    Intake/Output Summary (Last 24 hours) at 02/18/15 1252 Last data filed at 02/18/15 0900  Gross per 24 hour  Intake    480 ml  Output    400 ml  Net     80 ml   Filed Weights   02/14/15 1954  Weight: 77.4 kg (170 lb 10.2 oz)    Exam:   General:  AAO x1, pleasant and confused, mittens off  Cardiovascular: S1S2/RRR  Respiratory: CTAB  R lower chest/upper abd wall with drain, bloody output  Abdomen: soft, NT, BS present  Musculoskeletal: no edema c/c     Data Reviewed: Basic Metabolic Panel:  Recent Labs Lab 02/14/15 1241 02/14/15 1720 02/15/15 0410 02/16/15 0524  NA  --  136 139 138  K  --  4.6 4.2 4.1  CL  --  103 109 105  CO2  --  28 25 26   GLUCOSE  --  131* 88 84  BUN  --  12 11 10   CREATININE 1.10 0.88 0.86  0.87  CALCIUM  --  8.6* 8.3* 8.4*   Liver Function Tests:  Recent Labs Lab 02/15/15 0410  AST 21  ALT 31  ALKPHOS 65  BILITOT 0.4  PROT 5.7*  ALBUMIN 2.5*   No results for input(s): LIPASE, AMYLASE in the last 168 hours. No results for input(s): AMMONIA in the last 168 hours. CBC:  Recent Labs Lab 02/14/15 1720 02/15/15 0410 02/16/15 0524 02/18/15 0538  WBC 8.8 7.4 6.8 9.4  NEUTROABS 6.8  --   --   --   HGB 10.5* 9.8* 10.6* 11.5*  HCT 35.1* 31.5* 34.9* 37.4*  MCV 72.8* 72.1* 73.5* 73.6*  PLT 444* 410* 377 410*   Cardiac Enzymes: No results for input(s): CKTOTAL, CKMB, CKMBINDEX, TROPONINI in the last 168 hours. BNP (last 3 results) No results for input(s): BNP in the last 8760 hours.  ProBNP (last 3 results) No results for input(s): PROBNP in the last 8760 hours.  CBG: No results for input(s): GLUCAP in the last 168 hours.  Recent Results (from the past 240 hour(s))  Culture, routine-abscess     Status: None (Preliminary result)   Collection Time: 02/15/15  3:58 PM  Result Value Ref Range Status   Specimen Description ABSCESS THORACOABDOMINAL WALL  Final   Special Requests SYRINGE  Final   Gram Stain   Final    MODERATE WBC PRESENT,BOTH  PMN AND MONONUCLEAR NO SQUAMOUS EPITHELIAL CELLS SEEN FEW GRAM POSITIVE COCCI IN PAIRS MODERATE GRAM POSITIVE COCCI IN CLUSTERS    Culture   Final    ABUNDANT STAPHYLOCOCCUS AUREUS Note: RIFAMPIN AND GENTAMICIN SHOULD NOT BE USED AS SINGLE DRUGS FOR TREATMENT OF STAPH INFECTIONS. Performed at [facility]    Report Status PENDING  Incomplete  MRSA PCR Screening     Status: None   Collection Time: 02/17/15 11:30 AM  Result Value Ref Range Status   MRSA by PCR NEGATIVE NEGATIVE Final    Comment:        The GeneXpert MRSA Assay (FDA approved for NASAL specimens only), is one component of a comprehensive MRSA colonization surveillance program. It is not intended to diagnose MRSA infection nor to guide  or monitor treatment for MRSA infections.      Studies: No results found.  Scheduled Meds: . aspirin  325 mg Oral Daily  . aztreonam  2 g Intravenous Q8H  . baclofen  20 mg Oral QPM  . cholecalciferol  1,000 Units Oral Daily  . cyanocobalamin  500 mcg Oral Daily  . Dextromethorphan-Quinidine  1 capsule Oral Daily  . docusate sodium  100 mg Oral QPM  . enoxaparin (LOVENOX) injection  40 mg Subcutaneous Daily  . ferrous sulfate  325 mg Oral BID WC  . finasteride  5 mg Oral Daily  . metoprolol succinate  50 mg Oral Daily  . mirtazapine  7.5 mg Oral QHS  . multivitamin with minerals  1 tablet Oral Daily  . polyethylene glycol  17 g Oral Daily  . QUEtiapine  25 mg Oral QHS  . vancomycin  1,250 mg Intravenous Daily  . ascorbic acid  1,000 mg Oral Daily   Continuous Infusions:  Antibiotics Given (last 72 hours)    Date/Time Action Medication Dose Rate   02/15/15 1848 Given   aztreonam (AZACTAM) 2 g in dextrose 5 % 50 mL IVPB 2 g 100 mL/hr   02/15/15 1928 Given   vancomycin (VANCOCIN) IVPB 1000 mg/200 mL premix 1,000 mg 200 mL/hr   02/16/15 0141 Given   aztreonam (AZACTAM) 2 g in dextrose 5 % 50 mL IVPB 2 g 100 mL/hr   02/16/15 0511 Given   vancomycin (VANCOCIN) IVPB 1000 mg/200 mL premix 1,000 mg 200 mL/hr   02/16/15 0912 Given   aztreonam (AZACTAM) 2 g in dextrose 5 % 50 mL IVPB 2 g 100 mL/hr   02/16/15 1715 Given   aztreonam (AZACTAM) 2 g in dextrose 5 % 50 mL IVPB 2 g 100 mL/hr   02/16/15 1759 Given   vancomycin (VANCOCIN) IVPB 1000 mg/200 mL premix 1,000 mg 200 mL/hr   02/17/15 0130 Given   aztreonam (AZACTAM) 2 g in dextrose 5 % 50 mL IVPB 2 g 100 mL/hr   02/17/15 0604 Given   vancomycin (VANCOCIN) IVPB 1000 mg/200 mL premix 1,000 mg 200 mL/hr   02/17/15 1051 Given   aztreonam (AZACTAM) 2 g in dextrose 5 % 50 mL IVPB 2 g 100 mL/hr   02/17/15 1828 Given   aztreonam (AZACTAM) 2 g in dextrose 5 % 50 mL IVPB 2 g 100 mL/hr   02/18/15 0145 Given   aztreonam (AZACTAM) 2 g  in dextrose 5 % 50 mL IVPB 2 g 100 mL/hr   02/18/15 1032 Given   vancomycin (VANCOCIN) 1,250 mg in sodium chloride 0.9 % 250 mL IVPB 1,250 mg 166.7 mL/hr   02/18/15 1033 Given   aztreonam (AZACTAM) 2 g  in dextrose 5 % 50 mL IVPB 2 g 100 mL/hr      Principal Problem:   Abscess of chest [facility]) Active Problems:   HYPERTENSION, BENIGN   Hemiparesis due to old head trauma (HCC)   Chronic back pain   Abdominal fluid collection    Time spent:    [facility]  [facility] Pager [phone]. If 7PM-7AM, please contact night-coverage at [email or web address], password [facility] 02/18/2015, 12:52 PM  LOS: 4 days

## 2015-02-18 NOTE — Care Management Important Message (Signed)
Important Message  Patient Details IM Letter given to Rhonda/Case Manager to present to PatientImportant Message  Patient Details  Name: BERNARDINO DOWELL MRN: [id number] Date of Birth: [birth year]   Medicare Important Message Given:  Yes-second notification given    Haskell Flirt 02/18/2015, 1:50 PM Name: ELIN SEATS MRN: [id number] Date of Birth: 05-Apr-1936   Medicare Important Message Given:  Yes-second notification given    Haskell Flirt 02/18/2015, 1:49 PM

## 2015-02-19 DIAGNOSIS — J869 Pyothorax without fistula: Secondary | ICD-10-CM

## 2015-02-19 DIAGNOSIS — S0990XS Unspecified injury of head, sequela: Secondary | ICD-10-CM

## 2015-02-19 DIAGNOSIS — I10 Essential (primary) hypertension: Secondary | ICD-10-CM

## 2015-02-19 DIAGNOSIS — R188 Other ascites: Secondary | ICD-10-CM

## 2015-02-19 DIAGNOSIS — G8190 Hemiplegia, unspecified affecting unspecified side: Secondary | ICD-10-CM

## 2015-02-19 LAB — BASIC METABOLIC PANEL
Anion gap: 9 (ref 5–15)
BUN: 10 mg/dL (ref 6–20)
CALCIUM: 8.7 mg/dL — AB (ref 8.9–10.3)
CO2: 20 mmol/L — AB (ref 22–32)
CREATININE: 0.84 mg/dL (ref 0.61–1.24)
Chloride: 109 mmol/L (ref 101–111)
GFR calc non Af Amer: >60 mL/min (ref >60)
Glucose, Bld: 71 mg/dL (ref 65–99)
Potassium: 4.8 mmol/L (ref 3.5–5.1)
SODIUM: 138 mmol/L (ref 135–145)

## 2015-02-19 LAB — CBC
HEMATOCRIT: 41.8 % (ref 39.0–52.0)
Hemoglobin: 12.5 g/dL — ABNORMAL LOW (ref 13.0–17.0)
MCH: 22.3 pg — ABNORMAL LOW (ref 26.0–34.0)
MCHC: 29.9 g/dL — ABNORMAL LOW (ref 30.0–36.0)
MCV: 74.5 fL — AB (ref 78.0–100.0)
PLATELETS: 350 10*3/uL (ref 150–400)
RBC: 5.61 MIL/uL (ref 4.22–5.81)
RDW: 18.5 % — ABNORMAL HIGH (ref 11.5–15.5)
WBC: 13.8 10*3/uL — AB (ref 4.0–10.5)

## 2015-02-19 LAB — CULTURE, ROUTINE-ABSCESS

## 2015-02-19 MED ORDER — DOXYCYCLINE HYCLATE 100 MG PO CAPS: 100.0000 mg | ORAL_CAPSULE | Freq: Two times a day (BID) | ORAL | Status: DC

## 2015-02-19 NOTE — Progress Notes (Addendum)
[facility] PROGRESS NOTE  [name] [name]:[id number] DOB: 1935-10-23 DOA: 02/14/2015 PCP: [name], MD  Assessment/Plan: 1. ThoracoAbdominal chest wall abscess -at the site of prior GB drain, h/o lap chole 4/15 -Seen by CCS, IR  -s/p US guided drain placement, RUQ,-270cc purulent fluid aspirated on 10/7 -Was on broad spectrum, stable/afebrile, FU imaging when output <15cc per IR -Cx with MRSA -Stopped aztreonam, On IV Vanc day 5, will d/w ID for duration of Abx -surgery recommends PO Abx for 2 weeks and FU in Dr.Blackman and drain Clinic with IR -I discussed with ID Dr.Snider who felt ok to transition to Oral Abx at discharge and duration to be determined based on Repeat Imaging, hence I will change to PO Doxycycline at discharge for atleast 2 weeks but possibly longer depending on repeat Imaging -WBC higher this am will monitor  2. 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 Dementia -vascular and due to #2  4. HTN -continue toprol  5. Anemia -chronic with some worsening could be from dilution -now stable  6. RN reporting Dysphagia, coughing after meals, -will get swallow eval  DVT proph:  lovenox  Code Status: Full Code Family Communication: none at bedside, called and d/w wife 10/8, 10/9 Disposition Plan: Back to SNF tomorrow if WBC stable  Procedure:10/7:  US guided drain placement, RUQ, 75F to gravity. 270cc purulent fluid aspirated   HPI/Subjective: No complaints, no events overnight, still with significant drain output  Objective: Filed Vitals:   02/19/15 0830  BP: 138/73  Pulse: 55  Temp: 97.4 F (36.3 C)  Resp: 18    Intake/Output Summary (Last 24 hours) at 02/19/15 1113 Last data filed at 02/19/15 0533  Gross per 24 hour  Intake    240 ml  Output    600 ml  Net   -360 ml   Filed Weights   02/14/15 1954  Weight: 77.4 kg (170 lb 10.2 oz)    Exam:   General:  AAO x1, pleasant and  confused, mittens off  Cardiovascular: S1S2/RRR  Respiratory: CTAB  R lower chest/upper abd wall with drain, bloody output >100cc output this am  Abdomen: soft, NT, BS present  Musculoskeletal: no edema c/c     Data Reviewed: Basic Metabolic Panel:  Recent Labs Lab 02/14/15 1241 02/14/15 1720 02/15/15 0410 02/16/15 0524 02/19/15 0652  NA  --  136 139 138 138  K  --  4.6 4.2 4.1 4.8  CL  --  103 109 105 109  CO2  --  20*  GLUCOSE  --  131* 88 84 71  BUN  --  CREATININE 1.10 0.88 0.86 0.87 0.84  CALCIUM  --  8.6* 8.3* 8.4* 8.7*   Liver Function Tests:  Recent Labs Lab 02/15/15 0410  AST 21  ALT 31  ALKPHOS 65  BILITOT 0.4  PROT 5.7*  ALBUMIN 2.5*   No results for input(s): LIPASE, AMYLASE in the last 168 hours. No results for input(s): AMMONIA in the last 168 hours. CBC:  Recent Labs Lab 02/14/15 1720 02/15/15 0410 02/16/15 0524 02/18/15 0538 02/19/15 0652  WBC 8.8 7.4 6.8 9.4 13.8*  NEUTROABS 6.8  --   --   --   --   HGB 10.5* 9.8* 10.6* 11.5* 12.5*  HCT 35.1* 31.5* 34.9* 37.4* 41.8  MCV 72.8* 72.1* 73.5* 73.6* 74.5*  PLT 444* 410* 377 410* 350   Cardiac Enzymes: No results for  input(s): CKTOTAL, CKMB, CKMBINDEX, TROPONINI in the last 168 hours. BNP (last 3 results) No results for input(s): BNP in the last 8760 hours.  ProBNP (last 3 results) No results for input(s): PROBNP in the last 8760 hours.  CBG: No results for input(s): GLUCAP in the last 168 hours.  Recent Results (from the past 240 hour(s))  Culture, routine-abscess     Status: None   Collection Time: 02/15/15  3:58 PM  Result Value Ref Range Status   Specimen Description ABSCESS THORACOABDOMINAL WALL  Final   Special Requests SYRINGE  Final   Gram Stain   Final    MODERATE WBC PRESENT,BOTH PMN AND MONONUCLEAR NO SQUAMOUS EPITHELIAL CELLS SEEN FEW GRAM POSITIVE COCCI IN PAIRS MODERATE GRAM POSITIVE COCCI IN CLUSTERS    Culture   Final    ABUNDANT  METHICILLIN RESISTANT STAPHYLOCOCCUS AUREUS Note: RIFAMPIN AND GENTAMICIN SHOULD NOT BE USED AS SINGLE DRUGS FOR TREATMENT OF STAPH INFECTIONS. CRITICAL RESULT CALLED TO, READ BACK BY AND VERIFIED WITH: NAKIYHA @0850  ON 02/19/15 BY KENDR Performed at [facility]    Report Status 02/19/2015 FINAL  Final   Organism ID, Bacteria METHICILLIN RESISTANT STAPHYLOCOCCUS AUREUS  Final      Susceptibility   Methicillin resistant staphylococcus aureus - MIC*    CLINDAMYCIN >=8 RESISTANT Resistant     ERYTHROMYCIN >=8 RESISTANT Resistant     GENTAMICIN <=0.5 SENSITIVE Sensitive     LEVOFLOXACIN >=8 RESISTANT Resistant     OXACILLIN >=4 RESISTANT Resistant     RIFAMPIN <=0.5 SENSITIVE Sensitive     TRIMETH/SULFA <=10 SENSITIVE Sensitive     VANCOMYCIN <=0.5 SENSITIVE Sensitive     TETRACYCLINE 2 SENSITIVE Sensitive     * ABUNDANT METHICILLIN RESISTANT STAPHYLOCOCCUS AUREUS  MRSA PCR Screening     Status: None   Collection Time: 02/17/15 11:30 AM  Result Value Ref Range Status   MRSA by PCR NEGATIVE NEGATIVE Final    Comment:        The GeneXpert MRSA Assay (FDA approved for NASAL specimens only), is one component of a comprehensive MRSA colonization surveillance program. It is not intended to diagnose MRSA infection nor to guide or monitor treatment for MRSA infections.      Studies: No results found.  Scheduled Meds: . aspirin  325 mg Oral Daily  . baclofen  20 mg Oral QPM  . cholecalciferol  1,000 Units Oral Daily  . cyanocobalamin  500 mcg Oral Daily  . Dextromethorphan-Quinidine  1 capsule Oral Daily  . docusate sodium  100 mg Oral QPM  . enoxaparin (LOVENOX) injection  40 mg Subcutaneous Daily  . ferrous sulfate  325 mg Oral BID WC  . finasteride  5 mg Oral Daily  . metoprolol succinate  50 mg Oral Daily  . mirtazapine  7.5 mg Oral QHS  . multivitamin with minerals  1 tablet Oral Daily  . polyethylene glycol  17 g Oral Daily  . QUEtiapine  25 mg Oral QHS  .  vancomycin  1,250 mg Intravenous Daily  . ascorbic acid  1,000 mg Oral Daily   Continuous Infusions:  Antibiotics Given (last 72 hours)    Date/Time Action Medication Dose Rate   02/16/15 1715 Given   aztreonam (AZACTAM) 2 g in dextrose 5 % 50 mL IVPB 2 g 100 mL/hr   02/16/15 1759 Given   vancomycin (VANCOCIN) IVPB 1000 mg/200 mL premix 1,000 mg 200 mL/hr   02/17/15 0130 Given   aztreonam (AZACTAM) 2 g in dextrose 5 %  50 mL IVPB 2 g 100 mL/hr   02/17/15 0604 Given   vancomycin (VANCOCIN) IVPB 1000 mg/200 mL premix 1,000 mg 200 mL/hr   02/17/15 1051 Given   aztreonam (AZACTAM) 2 g in dextrose 5 % 50 mL IVPB 2 g 100 mL/hr   02/17/15 1828 Given   aztreonam (AZACTAM) 2 g in dextrose 5 % 50 mL IVPB 2 g 100 mL/hr   02/18/15 0145 Given   aztreonam (AZACTAM) 2 g in dextrose 5 % 50 mL IVPB 2 g 100 mL/hr   02/18/15 1032 Given   vancomycin (VANCOCIN) 1,250 mg in sodium chloride 0.9 % 250 mL IVPB 1,250 mg 166.7 mL/hr   02/18/15 1033 Given   aztreonam (AZACTAM) 2 g in dextrose 5 % 50 mL IVPB 2 g 100 mL/hr      Principal Problem:   Abscess of chest (HCC) Active Problems:   HYPERTENSION, BENIGN   Hemiparesis due to old head trauma (HCC)   Chronic back pain   Abdominal fluid collection    Time spent:    [facility]  [facility] Pager [phone]. If 7PM-7AM, please contact night-coverage at [email or web address], password [facility] 02/19/2015, 11:13 AM  LOS: 5 days

## 2015-02-19 NOTE — Discharge Summary (Signed)
Physician Discharge Summary  [name] XBJ:[id number] DOB: [birth year] DOA: 02/14/2015  PCP: [name], MD  Admit date: 02/14/2015 Discharge date: 02/20/2015  Time spent: 45 minutes  Recommendations for Outpatient Follow-up:  1. Dr.Douglas Magnus Ivan on 10/31, continue oral Doxy to be reevaluated by surgery. We'll need a repeated CT scan in 2 weeks as recommended by IR. 2. IR at [facility] be arranged by IR 3. PCP Dr.Aronson in 1 week  Discharge Diagnoses:  Principal Problem:   Abscess of chest (HCC)   MRSA chest/Abd wall absess   HYPERTENSION, BENIGN   Hemiparesis due to old head trauma (HCC)   Chronic back pain   Abdominal fluid collection   Dementia   Anemia  Discharge Condition: stable  Diet recommendation: depending on swallow assessment  Filed Weights   02/14/15 1954  Weight: 77.4 kg (170 lb 10.2 oz)    History of present illness:   Chief Complaint: Chest wall Abscess.  HPI: [name] is a 79 y.o. male with a past medical history traumatic brain injury due to a horse falling on the patient, with subsequent left hemispheric bleed that led to right hemiplegia, hypertension, hyperlipidemia, dementia, cholecystitis with laparoscopic cholecystectomy in April 2015 who came to the hospital to get a CT scan of the chest for evaluation of right-sided lower chest/Abd wall abscess. This abscess was discovered several days ago at a local skilled nursing facility, who referred the patient to [place] [facility] for incision and drainage. As the patient and his wife were getting ready to leave the hospital, they received a call from the ordering physician (Dr. Axel Filler), instructing them to go to the emergency department for inpatient treatment.  Hospital Course: Right ThoracoAbdominal wall abscess: - At the site of prior GB drain, h/o lap chole 4/15 - Seen by CCS, after discussion with CVTS they proceeded with IR consult for drainage - IR consulted, underwent US  guided drain placement, with purulent fluid aspirated on 10/7 - Was on broad spectrum, stable/afebrile, Plan for FU imaging when output <15cc per IR - Cx with MRSA sensitive to Bactrim and Tetracycline - Treated with IV Vanc and Aztreonam and then Aztreonam was stopped - Surgery recommends PO Abx for 2 weeks and FU in Dr.Blackman and drain Clinic with IR - Dr. Jomarie Longs discussed with ID Dr.Snider who felt ok to transition to Oral Abx at discharge and duration to be determined based on - Repeat Imaging. 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   Procedures:  10/7: US guided drain placement, RUQ, 73F to gravity. 270cc purulent fluid aspirated  Consultations:  CCS  IR  Discharge Exam: Filed Vitals:   02/19/15 2158  BP: 104/79  Pulse: 72  Temp: 97.4 F (36.3 C)  Resp: 18    General: Alert, awake, confused Cardiovascular: S1S2/RRR Respiratory: CTAB  Discharge Instructions   Discharge Instructions    Diet - low sodium heart healthy    Complete by:  As directed      Increase activity slowly    Complete by:  As directed           Current Discharge Medication List    CONTINUE these medications which have CHANGED   Details  doxycycline (VIBRAMYCIN) 100 MG capsule Take 1 capsule (100 mg total) by mouth 2 (two) times daily. For 14days, may need longer course depending on Repeat Imaging      CONTINUE these medications which have NOT CHANGED   Details  albuterol (PROAIR HFA) 108 (90 BASE) MCG/ACT inhaler Inhale 2 puffs into the lungs every 3 (three) hours as needed for wheezing or shortness of breath.    ascorbic acid (VITAMIN C) 1000 MG tablet Take 1,000 mg by mouth daily.    aspirin 325 MG tablet  Take 325 mg by mouth daily.    baclofen (LIORESAL) 20 MG tablet Take 20 mg by mouth every evening.    cholecalciferol (VITAMIN D) 1000 UNITS tablet Take 1,000 Units by mouth daily.      clonazePAM (KLONOPIN) 0.5 MG tablet Take 1 tablet (0.5 mg total) by mouth 2 (two) times daily as needed (anxiety). Qty: 30 tablet, Refills: 0    cyanocobalamin 500 MCG tablet Take 500 mcg by mouth daily.    Dextromethorphan-Quinidine (NUEDEXTA) 20-10 MG CAPS Take 1 capsule by mouth daily.    docusate sodium (COLACE) 100 MG capsule Take 100 mg by mouth every evening.    ferrous sulfate 325 (65 FE) MG tablet Take 325 mg by mouth 2 (two) times daily.    finasteride (PROSCAR) 5 MG tablet Take 5 mg by mouth daily.    Hypromellose (GENTEAL) 0.3 % SOLN Place 1 drop into both eyes 3 (three) times daily as needed (dry eyes).    Liniments (SALONPAS) PADS Apply 1 each topically daily as needed (pain). Apply to left knee    metoprolol succinate (TOPROL-XL) 50 MG 24 hr tablet Take 50 mg by mouth daily. Take with or immediately following a meal.    mirtazapine (REMERON) 7.5 MG tablet Take 7.5 mg by mouth at bedtime.    Multiple Vitamin (MULITIVITAMIN WITH MINERALS) TABS Take 1 tablet by mouth daily.      polyethylene glycol (MIRALAX / GLYCOLAX) packet Take 17 g by mouth daily.    QUEtiapine (SEROQUEL) 25 MG tablet Take 25 mg by mouth at bedtime.      STOP taking these medications     Maltodextrin-Xanthan Gum (RESOURCE THICKENUP CLEAR) POWD        Allergies  Allergen Reactions  . Ace Inhibitors Swelling  . Penicillins Swelling    Has patient had a PCN reaction causing immediate rash, facial/tongue/throat swelling, SOB or lightheadedness with hypotension: yes Has patient had a PCN reaction causing severe rash involving mucus membranes or skin necrosis: unknown Has patient had a PCN reaction that required hospitalization no Has patient had a PCN reaction occurring within the last 10 years: no If all of  the above answers are "NO", then may proceed with Cephalosporin use.    Follow-up Information    Follow up with Shelly Rubenstein, MD On 03/11/2015.   Specialty:  General Surgery   Why:  1:50pm, arrive no later than 1:20pm for paperwork   Contact information:   [address] [place] [address] [phone]       Follow up with WAGNER, JAIME, DO.   Specialty:  Interventional Radiology   Contact information:   [address] [place] [address] [phone]        The results of significant diagnostics from this hospitalization (including imaging, microbiology, ancillary and laboratory) are listed below for reference.    Significant Diagnostic Studies: Ct Chest W Contrast  02/14/2015  CLINICAL DATA:  Purulent drainage from right chest wall abscess. EXAM: CT CHEST WITH CONTRAST TECHNIQUE: Multidetector CT imaging of the chest was performed during intravenous contrast administration. CONTRAST:  75mL OMNIPAQUE IOHEXOL 300 MG/ML  SOLN COMPARISON:  10/09/2013 FINDINGS: Mediastinum/Nodes: Confluent nodules or mass of the right inferior thyroid lobe, 4.8 by 2.8 cm on image 15 series 2 if this has not been previously worked up, thyroid ultrasound would be indicated. Atherosclerotic calcification of the aortic arch. Large hiatal hernia containing most of the stomach and some of the transverse colon. Right hilar lymph node 0.8 cm in short axis, image 28 series 2. No other thoracic adenopathy is identified. Lungs/Pleura: Along the right posterior costophrenic space, and believed to probably be just above the right hemidiaphragm, there is a 10 cm by 11.3 cm by 5.4 cm walled off fluid collection with enhancing margins favoring an abscess. I am suspicious that this may be extending through the seventh intercostal space laterally and probably connecting to the thick walled 3.7 by 2.4 by 4.4 cm subcutaneous abscess/ fluid collection which in turn extends to the skin surface on  image 63 of series 2. There is a [place] non loculated right pleural effusion and associated passive atelectasis. The large hiatal hernia also causes passive atelectasis. Upper abdomen: The suspected abscess in the posterior costophrenic space mildly indents the posterior margin of the liver due to extrinsic mass effect. Prior cholecystectomy. Left peripelvic cysts. Musculoskeletal: Severe bilateral degenerative glenohumeral arthropathy. Old nonunited fracture of the right distal clavicle. Multiple chronic bony fragments in the vicinity of the left [facility] joint. Old healed left lateral third rib fracture. Incidental lipoma of the right teres minor muscle. Atrophy of the left subscapularis and infraspinatus muscles. Free osteochondral fragments in the right subscapular recess. IMPRESSION: 1. In the right costophrenic sulcus and believed to be just above the right hemidiaphragm, there is 11 cm complex walled off thickly marginally enhancing fluid collection suspicious for subpulmonic abscess, with a [name] surrounding pleural effusion. There is also an abscess along the right lateral thoracoabdominal wall extending from the seventh intercostal space towards the pleural space, and this could represent drainage of empyema or connection of the original large abscess into the subcutaneous tissues. The subcutaneous collection is probably draining to the skin. Drainage recommended. 2. Large hiatal hernia containing stomach and transverse colon. 3. Severe chronic arthropathy of the shoulders. 4. Confluent nodules or mass of the right inferior thyroid lobe measuring up to 4.8 cm. If not previously worked up, thyroid ultrasound would be recommended in the nonacute setting. Electronically Signed   By: Gaylyn Rong M.D.   On: 02/14/2015 13:24   [place] Image Guided Drainage Percut Cath  Peritoneal Retroperit  02/15/2015  CLINICAL DATA:  79 year old male with a history of right upper quadrant fluid collection, concern for abscess.  EXAM: ULTRASOUND GUIDED DRAIN OF RIGHT UPPER QUADRANT ABSCESS MEDICATIONS: 0.5 mg IV Versed; 25 mcg IV Fentanyl Total Moderate Sedation Time: 18 PROCEDURE: The procedure, risks, benefits, and alternatives were explained to the patient and the patient's family. Questions regarding the procedure were encouraged and answered. The patient understands and consents to the procedure. Patient is position in the right anterior oblique position on the stretcher. Ultrasound survey was performed with images stored and sent to PACs. The right flank was prepped with Betadine in a sterile fashion, and a sterile drape was applied covering the operative field. A sterile gown and sterile gloves were used for the procedure. Local anesthesia was provided with 1% Lidocaine. Once the patient  is prepped and draped sterilely, the skin and subcutaneous tissues were generously infiltrated with 1% lidocaine for local anesthesia. A Cost stab inches was made with 11 blade scalpel. Under ultrasound guidance, a 12 French drain was advanced into the abscess in the right upper quadrant under trocar technique. Once the tip of the needle was centered within the fluid collection, the drain was advanced off the trocar into the collection. Pigtail catheter is formed. Approximately 270 cc of purulent fluid was then aspirated. Catheter was attached to a drainage bag, catheter was formed, and a suture was placed for retention suture. Patient tolerated the procedure well and remained hemodynamically stable throughout. No complications were encountered and no significant blood loss. COMPLICATIONS: None. FINDINGS: Ultrasound survey demonstrates complex fluid collection in the right upper quadrant. 270 cc of purulent fluid removed. Final image demonstrates no complicating features. IMPRESSION: Status post ultrasound-guided drain placement into right upper quadrant abscess. 270 cc of purulent fluid aspirated. Sample sent to the lab for analysis. Signed, Yvone Neu. Loreta Ave, DO [facility] Electronically Signed   By: Gilmer Mor D.O.   On: 02/15/2015 17:05    Microbiology: Recent Results (from the past 240 hour(s))  Culture, routine-abscess     Status: None   Collection Time: 02/15/15  3:58 PM  Result Value Ref Range Status   Specimen Description ABSCESS THORACOABDOMINAL WALL  Final   Special Requests SYRINGE  Final   Gram Stain   Final    MODERATE WBC PRESENT,BOTH PMN AND MONONUCLEAR NO SQUAMOUS EPITHELIAL CELLS SEEN FEW GRAM POSITIVE COCCI IN PAIRS MODERATE GRAM POSITIVE COCCI IN CLUSTERS    Culture   Final    ABUNDANT METHICILLIN RESISTANT STAPHYLOCOCCUS AUREUS Note: RIFAMPIN AND GENTAMICIN SHOULD NOT BE USED AS SINGLE DRUGS FOR TREATMENT OF STAPH INFECTIONS. CRITICAL RESULT CALLED TO, READ BACK BY AND VERIFIED WITH: NAKIYHA @0850  ON 02/19/15 BY KENDR Performed at [facility]    Report Status 02/19/2015 FINAL  Final   Organism ID, Bacteria METHICILLIN RESISTANT STAPHYLOCOCCUS AUREUS  Final      Susceptibility   Methicillin resistant staphylococcus aureus - MIC*    CLINDAMYCIN >=8 RESISTANT Resistant     ERYTHROMYCIN >=8 RESISTANT Resistant     GENTAMICIN <=0.5 SENSITIVE Sensitive     LEVOFLOXACIN >=8 RESISTANT Resistant     OXACILLIN >=4 RESISTANT Resistant     RIFAMPIN <=0.5 SENSITIVE Sensitive     TRIMETH/SULFA <=10 SENSITIVE Sensitive     VANCOMYCIN <=0.5 SENSITIVE Sensitive     TETRACYCLINE 2 SENSITIVE Sensitive     * ABUNDANT METHICILLIN RESISTANT STAPHYLOCOCCUS AUREUS  MRSA PCR Screening     Status: None   Collection Time: 02/17/15 11:30 AM  Result Value Ref Range Status   MRSA by PCR NEGATIVE NEGATIVE Final    Comment:        The GeneXpert MRSA Assay (FDA approved for NASAL specimens only), is one component of a comprehensive MRSA colonization surveillance program. It is not intended to diagnose MRSA infection nor to guide or monitor treatment  for MRSA infections.      Labs: Basic Metabolic Panel:  Recent Labs Lab 02/14/15 1720 02/15/15 0410 02/16/15 0524 02/19/15 0652 02/20/15 0555  NA 136 139 138 138 138  K 4.6 4.2 4.1 4.8 4.3  CL 103 109 105 109 109  CO2 28 25 26  20* 24  GLUCOSE 131* 88 84 71 84  BUN 12 11 10 10 9   CREATININE 0.88 0.86 0.87 0.84 0.87  CALCIUM 8.6* 8.3* 8.4* 8.7* 8.3*   Liver Function Tests:  Recent Labs Lab 02/15/15 0410  AST 21  ALT 31  ALKPHOS 65  BILITOT 0.4  PROT 5.7*  ALBUMIN 2.5*   No results for input(s): LIPASE, AMYLASE in the last 168 hours. No results for input(s): AMMONIA in the last 168 hours. CBC:  Recent Labs Lab 02/14/15 1720 02/15/15 0410 02/16/15 0524 02/18/15 0538 02/19/15 0652 02/20/15 0555  WBC 8.8 7.4 6.8 9.4 13.8* 7.3  NEUTROABS 6.8  --   --   --   --   --   HGB 10.5* 9.8* 10.6* 11.5* 12.5* 11.2*  HCT 35.1* 31.5* 34.9* 37.4* 41.8 37.0*  MCV 72.8* 72.1* 73.5* 73.6* 74.5* 74.1*  PLT 444* 410* 377 410* 350 373   Cardiac Enzymes: No results for input(s): CKTOTAL, CKMB, CKMBINDEX, TROPONINI in the last 168 hours. BNP: BNP (last 3 results) No results for input(s): BNP in the last 8760 hours.  ProBNP (last 3 results) No results for input(s): PROBNP in the last 8760 hours.  CBG: No results for input(s): GLUCAP in the last 168 hours.     Signed:  Marinda Elk  [facility] 02/20/2015, 10:20 AM

## 2015-02-19 NOTE — Progress Notes (Addendum)
CSW called [facility] to inform of patient's discharge on tomorrow. Facility appreciative of update. CSW also informed spouse [name] regarding anticipated discharge date on tomorrow. CSW will follow up tomorrow withDyllon [name] and family.   [name], [facility] Clinical Social Worker [place] [phone]

## 2015-02-19 NOTE — Progress Notes (Signed)
Date:  Oct. 11, 2016 U.R. performed for needs and level of care. Will continue to follow for Case Management needs.  Nachelle Negrette, RN, BSN, CCM   [phone] 

## 2015-02-19 NOTE — Progress Notes (Signed)
Referring Physician(s): TRH  Chief Complaint:  RUQ abscess drain placed 10/7  Subjective:  Pt feels better Eating full diet Drain intact Output significant  Allergies: Ace inhibitors and Penicillins  Medications: Prior to Admission medications   Medication Sig Start Date End Date Taking? Authorizing Provider  albuterol (PROAIR HFA) 108 (90 BASE) MCG/ACT inhaler Inhale 2 puffs into the lungs every 3 (three) hours as needed for wheezing or shortness of breath.   Yes Historical Provider, MD  ascorbic acid (VITAMIN C) 1000 MG tablet Take 1,000 mg by mouth daily.   Yes Historical Provider, MD  aspirin 325 MG tablet Take 325 mg by mouth daily.   Yes Historical Provider, MD  baclofen (LIORESAL) 20 MG tablet Take 20 mg by mouth every evening.   Yes Historical Provider, MD  cholecalciferol (VITAMIN D) 1000 UNITS tablet Take 1,000 Units by mouth daily.     Yes Historical Provider, MD  clonazePAM (KLONOPIN) 0.5 MG tablet Take 1 tablet (0.5 mg total) by mouth 2 (two) times daily as needed (anxiety). 08/09/13  Yes Alysia Penna, MD  cyanocobalamin 500 MCG tablet Take 500 mcg by mouth daily.   Yes Historical Provider, MD  Dextromethorphan-Quinidine (NUEDEXTA) 20-10 MG CAPS Take 1 capsule by mouth daily.   Yes Historical Provider, MD  docusate sodium (COLACE) 100 MG capsule Take 100 mg by mouth every evening.   Yes Historical Provider, MD  doxycycline (VIBRAMYCIN) 100 MG capsule Take 100 mg by mouth 2 (two) times daily. For 7 days 10-4 to 10-11   Yes Historical Provider, MD  ferrous sulfate 325 (65 FE) MG tablet Take 325 mg by mouth 2 (two) times daily.   Yes Historical Provider, MD  finasteride (PROSCAR) 5 MG tablet Take 5 mg by mouth daily.   Yes Historical Provider, MD  Hypromellose (GENTEAL) 0.3 % SOLN Place 1 drop into both eyes 3 (three) times daily as needed (dry eyes).   Yes Historical Provider, MD  Liniments Chinita Pester) PADS Apply 1 each topically daily as needed (pain). Apply to left  knee   Yes Historical Provider, MD  metoprolol succinate (TOPROL-XL) 50 MG 24 hr tablet Take 50 mg by mouth daily. Take with or immediately following a meal.   Yes Historical Provider, MD  mirtazapine (REMERON) 7.5 MG tablet Take 7.5 mg by mouth at bedtime.   Yes Historical Provider, MD  Multiple Vitamin (MULITIVITAMIN WITH MINERALS) TABS Take 1 tablet by mouth daily.     Yes Historical Provider, MD  polyethylene glycol (MIRALAX / GLYCOLAX) packet Take 17 g by mouth daily.   Yes Historical Provider, MD  QUEtiapine (SEROQUEL) 25 MG tablet Take 25 mg by mouth at bedtime.   Yes Historical Provider, MD  Maltodextrin-Xanthan Gum (RESOURCE THICKENUP CLEAR) POWD Take 120 g by mouth as needed. Patient not taking: Reported on 02/14/2015 10/13/13   Geoffry Paradise, MD     Vital Signs: BP 177/81 mmHg  Pulse 150  Temp(Src) 94.3 F (34.6 C) (Axillary)  Resp 18  Ht 6' 0.05" (1.83 m)  Wt 170 lb 10.2 oz (77.4 kg)  BMI 23.11 kg/m2  SpO2 66%  Physical Exam  Abdominal: Soft. Bowel sounds are normal.  Drain site clean and dry NT Output 50 cc yesterday 75 cc in bag: brownish fluid +staph aureus Wbc 13.8 (9.4)   Skin: Skin is warm and dry.  Nursing note and vitals reviewed.   Imaging: [place] Image Guided Drainage Percut Cath  Peritoneal Retroperit  02/15/2015   CLINICAL DATA:  79 year old  male with a history of right upper quadrant fluid collection, concern for abscess.  EXAM: ULTRASOUND GUIDED DRAIN OF RIGHT UPPER QUADRANT ABSCESS  MEDICATIONS: 0.5 mg IV Versed; 25 mcg IV Fentanyl  Total Moderate Sedation Time: 18  PROCEDURE: The procedure, risks, benefits, and alternatives were explained to the patient and the patient's family. Questions regarding the procedure were encouraged and answered. The patient understands and consents to the procedure.  Patient is position in the right anterior oblique position on the stretcher. Ultrasound survey was performed with images stored and sent to PACs.  The right flank  was prepped with Betadine in a sterile fashion, and a sterile drape was applied covering the operative field. A sterile gown and sterile gloves were used for the procedure. Local anesthesia was provided with 1% Lidocaine.  Once the patient is prepped and draped sterilely, the skin and subcutaneous tissues were generously infiltrated with 1% lidocaine for local anesthesia. A Sami stab inches was made with 11 blade scalpel.  Under ultrasound guidance, a 12 French drain was advanced into the abscess in the right upper quadrant under trocar technique. Once the tip of the needle was centered within the fluid collection, the drain was advanced off the trocar into the collection. Pigtail catheter is formed.  Approximately 270 cc of purulent fluid was then aspirated. Catheter was attached to a drainage bag, catheter was formed, and a suture was placed for retention suture.  Patient tolerated the procedure well and remained hemodynamically stable throughout.  No complications were encountered and no significant blood loss.  COMPLICATIONS: None.  FINDINGS: Ultrasound survey demonstrates complex fluid collection in the right upper quadrant.  270 cc of purulent fluid removed.  Final image demonstrates no complicating features.  IMPRESSION: Status post ultrasound-guided drain placement into right upper quadrant abscess. 270 cc of purulent fluid aspirated. Sample sent to the lab for analysis.  Signed,  Yvone Neu. Loreta Ave, DO  [facility]  [facility]   Electronically Signed   By: Gilmer Mor D.O.   On: 02/15/2015 17:05    Labs:  CBC:  Recent Labs  02/15/15 0410 02/16/15 0524 02/18/15 0538 02/19/15 0652  WBC 7.4 6.8 9.4 13.8*  HGB 9.8* 10.6* 11.5* 12.5*  HCT 31.5* 34.9* 37.4* 41.8  PLT 410* 377 410* 350    COAGS: No results for input(s): INR, APTT in the last 8760 hours.  BMP:  Recent Labs  02/14/15 1720 02/15/15 0410 02/16/15 0524 02/19/15 0652  NA 136  139 138 138  K 4.6 4.2 4.1 4.8  CL 103 109 105 109  CO2 20*  GLUCOSE 131* 88 84 71  BUN CALCIUM 8.6* 8.3* 8.4* 8.7*  CREATININE 0.88 0.86 0.87 0.84  GFRNONAA >60 >60 >60 >60  GFRAA >60 >60 >60 >60    LIVER FUNCTION TESTS:  Recent Labs  02/15/15 0410  BILITOT 0.4  AST 21  ALT 31  ALKPHOS 65  PROT 5.7*  ALBUMIN 2.5*    Assessment and Plan:  RUQ abscess post lap chole Still significant output If dc---will need follow in drain clinic We will arrange appt---let us know of dc  Signed: Azadeh Hyder A 02/19/2015, 8:07 AM   I spent a total of 15 Minutes at the the patient's bedside AND on the patient's hospital floor or unit, greater than 50% of which was counseling/coordinating care for RUQ abscess

## 2015-02-20 LAB — CBC
HEMATOCRIT: 37 % — AB (ref 39.0–52.0)
HEMOGLOBIN: 11.2 g/dL — AB (ref 13.0–17.0)
MCH: 22.4 pg — ABNORMAL LOW (ref 26.0–34.0)
MCHC: 30.3 g/dL (ref 30.0–36.0)
MCV: 74.1 fL — ABNORMAL LOW (ref 78.0–100.0)
Platelets: 373 10*3/uL (ref 150–400)
RBC: 4.99 MIL/uL (ref 4.22–5.81)
RDW: 19.1 % — ABNORMAL HIGH (ref 11.5–15.5)
WBC: 7.3 10*3/uL (ref 4.0–10.5)

## 2015-02-20 LAB — BASIC METABOLIC PANEL
ANION GAP: 5 (ref 5–15)
BUN: 9 mg/dL (ref 6–20)
CHLORIDE: 109 mmol/L (ref 101–111)
CO2: 24 mmol/L (ref 22–32)
Calcium: 8.3 mg/dL — ABNORMAL LOW (ref 8.9–10.3)
Creatinine, Ser: 0.87 mg/dL (ref 0.61–1.24)
GFR calc non Af Amer: >60 mL/min (ref >60)
Glucose, Bld: 84 mg/dL (ref 65–99)
Potassium: 4.3 mmol/L (ref 3.5–5.1)
Sodium: 138 mmol/L (ref 135–145)

## 2015-02-20 NOTE — Progress Notes (Signed)
[facility] [id number] admit date 10/12 review on 10/14 and rug score nursing. CSW informed [facility].. Pt to be transported by ptar. Patient spouse informed and in agreement of plan. Transportation arranged for 530pm as agreed upon with patient, spouse, and nurse. RN to call report to [phone]. Pt dc packet in shadow chart.   Olga CoasterKristen Mikiah Demond, LCSW  Clinical Social Work  [facility] [phone]

## 2015-02-20 NOTE — Evaluation (Signed)
Physical Therapy Evaluation Patient Details Name: Jyl HeinzJohn F Bracey MRN: [id number] DOB: 02/25/1936 Today's Date: 02/20/2015   History of Present Illness  79 yo male admitted with chest wall abscess. S/P perc drain 10/7. Hx of TBI, HTN, vertigo, CVA, dementia, chronic pain. Pt is from  SNF  Clinical Impression  On eval, pt required Max assist +2 for bed mobility. Sat EOB ~5 minutes with Min guard assist. Performed L UE/L LE x 5 reps in sitting. Pt is weaker than baseline from time spent in bed during hospital stay. Recommend return to SNF for continued rehab.     Follow Up Recommendations SNF    Equipment Recommendations  None recommended by PT    Recommendations for Other Services       Precautions / Restrictions Precautions Precautions: Fall Precaution Comments: R side drain abdomen Restrictions Weight Bearing Restrictions: No      Mobility  Bed Mobility Overal bed mobility: Needs Assistance Bed Mobility: Supine to Sit;Sit to Supine     Supine to sit: Max assist;+2 for physical assistance;+2 for safety/equipment;HOB elevated Sit to supine: Max assist;+2 for physical assistance;+2 for safety/equipment;HOB elevated   General bed mobility comments: Assist for trunk and bil LEs. Increased time. Utilized bedpad for scooting, positioning.   Transfers                 General transfer comment: NT  Ambulation/Gait                Stairs            Wheelchair Mobility    Modified Rankin (Stroke Patients Only)       Balance Overall balance assessment: Needs assistance Sitting-balance support: Single extremity supported;Feet supported Sitting balance-Leahy Scale: Fair Sitting balance - Comments: Min guard assist for sitting balance. Sat EOB ~5 minutes. Able to perform some L UE and L LE exercises.                                      Pertinent Vitals/Pain Pain Assessment: No/denies pain    Home Living Family/patient expects to be  discharged to:: Skilled nursing facility                      Prior Function Level of Independence: Needs assistance   Gait / Transfers Assistance Needed: transfers to/from wheelchair with assistance.   ADL's / Homemaking Assistance Needed: Assist needed for ADLs        Hand Dominance        Extremity/Trunk Assessment   Upper Extremity Assessment: RUE deficits/detail;LUE deficits/detail RUE Deficits / Details: hemiplegia from previous TBI injury. No functional use     LUE Deficits / Details: generalized weakness. Strength at least 3/5   Lower Extremity Assessment: RLE deficits/detail;LLE deficits/detail RLE Deficits / Details: hemiplegia from previous TBI injury. No functional use LLE Deficits / Details: Strength at least 3/5. generalized weakness  Cervical / Trunk Assessment: Kyphotic  Communication   Communication: HOH;Expressive difficulties  Cognition Arousal/Alertness: Awake/alert Behavior During Therapy: Flat affect Overall Cognitive Status: History of cognitive impairments - at baseline                      General Comments      Exercises General Exercises - Upper Extremity Shoulder Flexion: AROM;Left;5 reps;Seated Elbow Flexion: AROM;Left;5 reps;Seated General Exercises - Lower Extremity Long Arc Quad: AROM;5 reps;Seated Hip Flexion/Marching: AROM;Left (  3 reps)      Assessment/Plan    PT Assessment Patient needs continued PT services  PT Diagnosis Generalized weakness;Hemiplegia non-dominant side   PT Problem List Decreased strength;Decreased activity tolerance;Decreased balance;Decreased mobility;Decreased cognition;Decreased knowledge of use of DME  PT Treatment Interventions DME instruction;Functional mobility training;Therapeutic activities;Patient/family education;Balance training;Therapeutic exercise   PT Goals (Current goals can be found in the Care Plan section) Acute Rehab PT Goals Patient Stated Goal: return to SNF to resume  rehab PT Goal Formulation: With family Time For Goal Achievement: 03/06/15 Potential to Achieve Goals: Fair    Frequency Min 3X/week   Barriers to discharge        Co-evaluation               End of Session   Activity Tolerance: Patient tolerated treatment well Patient left: in bed;with call bell/phone within reach;with family/visitor present           Time: 1610-9604 PT Time Calculation (min) (ACUTE ONLY): 16 min   Charges:   PT Evaluation $Initial PT Evaluation Tier I: 1 Procedure     PT G Codes:        Rebeca Alert, MPT Pager: [phone]

## 2015-02-20 NOTE — Progress Notes (Signed)
PT Cancellation Note  Patient Details Name: [name] HeinzJohn F [name] MRN: [id number] DOB: 07/23/1935   Cancelled Treatment:    Reason Eval/Treat Not Completed: Attempted PT eval. Pt currently having lunch. Will check back later. Thanks.    Rebeca AlertJannie Ed Mandich, MPT Pager: [phone]

## 2015-02-20 NOTE — Evaluation (Signed)
SLP Cancellation Note  Patient Details Name: Jyl HeinzJohn F Wickwire MRN: [id number] DOB: 07/07/1935   Cancelled treatment:       Reason Eval/Treat Not Completed: Patient at procedure or test/unavailable;Other (comment) (pt needing to be cleaned up as he reports having bowel movement, will reattempt at later time)  Donavan Burnetamara Keinan Brouillet, MS [facility] SLP [phone]

## 2015-02-20 NOTE — Clinical Social Work Placement (Signed)
   CLINICAL SOCIAL WORK PLACEMENT  NOTE  Date:  02/20/2015  Patient Details  Name: [name] MRN: [id number] Date of Birth: 04/06/1936  Clinical Social Work is seeking post-discharge placement for this patient at the Skilled  Nursing Facility level of care (*CSW will initial, date and re-position this form in  chart as items are completed):  Yes   Patient/family provided with [place] Clinical Social Work Department's list of facilities offering this level of care within the geographic area requested by the patient (or if unable, by the patient's family).  Yes   Patient/family informed of their freedom to choose among providers that offer the needed level of care, that participate in Medicare, Medicaid or managed care program needed by the patient, have an available bed and are willing to accept the patient.  Yes   Patient/family informed of [facility]'s ownership interest in [facility] and [facility], as well as of the fact that they are under no obligation to receive care at these facilities.  PASRR submitted to EDS on 02/20/15     PASRR number received on 02/20/15     Existing PASRR number confirmed on       FL2 transmitted to all facilities in geographic area requested by pt/family on       FL2 transmitted to all facilities within larger geographic area on       Patient informed that his/her managed care company has contracts with or will negotiate with certain facilities, including the following:        Yes   Patient/family informed of bed offers received.  Patient chooses bed at  [facility] )     Physician recommends and patient chooses bed at      Patient to be transferred to  ([facility]) on 02/20/15.  Patient to be transferred to facility by  [facility])     Patient family notified on 02/20/15 of transfer.  Name of family member notified:  Wife: Michele McalpineJean Clavel      PHYSICIAN       Additional Comment:  auth # [id number] for blue  medicare.  _______________________________________________ Duke SalviaEED, Hulbert Branscome A, LCSW 02/20/2015, 4:39 PM

## 2015-02-20 NOTE — Progress Notes (Signed)
Patient ID: [name] HeinzJohn F [name], male   DOB: 09/27/1935, 79 y.o.   MRN: [id number]    Referring Physician(s): TRH/CCS  Chief Complaint:  RUQ abscess  Subjective:  Pt resting comfortably; wants to go home; denies sig abd pain; has occ cough  Allergies: Ace inhibitors and Penicillins  Medications: Prior to Admission medications   Medication Sig Start Date End Date Taking? Authorizing Provider  albuterol (PROAIR HFA) 108 (90 BASE) MCG/ACT inhaler Inhale 2 puffs into the lungs every 3 (three) hours as needed for wheezing or shortness of breath.   Yes Historical Provider, MD  ascorbic acid (VITAMIN C) 1000 MG tablet Take 1,000 mg by mouth daily.   Yes Historical Provider, MD  aspirin 325 MG tablet Take 325 mg by mouth daily.   Yes Historical Provider, MD  baclofen (LIORESAL) 20 MG tablet Take 20 mg by mouth every evening.   Yes Historical Provider, MD  cholecalciferol (VITAMIN D) 1000 UNITS tablet Take 1,000 Units by mouth daily.     Yes Historical Provider, MD  clonazePAM (KLONOPIN) 0.5 MG tablet Take 1 tablet (0.5 mg total) by mouth 2 (two) times daily as needed (anxiety). 08/09/13  Yes Alysia PennaScott Holwerda, MD  cyanocobalamin 500 MCG tablet Take 500 mcg by mouth daily.   Yes Historical Provider, MD  Dextromethorphan-Quinidine (NUEDEXTA) 20-10 MG CAPS Take 1 capsule by mouth daily.   Yes Historical Provider, MD  docusate sodium (COLACE) 100 MG capsule Take 100 mg by mouth every evening.   Yes Historical Provider, MD  ferrous sulfate 325 (65 FE) MG tablet Take 325 mg by mouth 2 (two) times daily.   Yes Historical Provider, MD  finasteride (PROSCAR) 5 MG tablet Take 5 mg by mouth daily.   Yes Historical Provider, MD  Hypromellose (GENTEAL) 0.3 % SOLN Place 1 drop into both eyes 3 (three) times daily as needed (dry eyes).   Yes Historical Provider, MD  Liniments Chinita Pester(SALONPAS) PADS Apply 1 each topically daily as needed (pain). Apply to left knee   Yes Historical Provider, MD  metoprolol succinate (TOPROL-XL)  50 MG 24 hr tablet Take 50 mg by mouth daily. Take with or immediately following a meal.   Yes Historical Provider, MD  mirtazapine (REMERON) 7.5 MG tablet Take 7.5 mg by mouth at bedtime.   Yes Historical Provider, MD  Multiple Vitamin (MULITIVITAMIN WITH MINERALS) TABS Take 1 tablet by mouth daily.     Yes Historical Provider, MD  polyethylene glycol (MIRALAX / GLYCOLAX) packet Take 17 g by mouth daily.   Yes Historical Provider, MD  QUEtiapine (SEROQUEL) 25 MG tablet Take 25 mg by mouth at bedtime.   Yes Historical Provider, MD  doxycycline (VIBRAMYCIN) 100 MG capsule Take 1 capsule (100 mg total) by mouth 2 (two) times daily. For 14days, may need longer course depending on Repeat Imaging 02/19/15   Zannie CovePreetha Joseph, MD  Maltodextrin-Xanthan Gum (RESOURCE THICKENUP CLEAR) POWD Take 120 g by mouth as needed. Patient not taking: Reported on 02/14/2015 10/13/13   Geoffry Paradiseichard Aronson, MD     Vital Signs: BP 104/79 mmHg  Pulse 72  Temp(Src) 97.4 F (36.3 C) (Oral)  Resp 18  Ht 6' 0.05" (1.83 m)  Wt 170 lb 10.2 oz (77.4 kg)  BMI 23.11 kg/m2  SpO2 98%  Physical Exam RUQ drain intact, insertion site ok,NT, output 200cc purulent reddish- brown fluid; cx's- MRSA  Imaging: No results found.  Labs:  CBC:  Recent Labs  02/16/15 0524 02/18/15 0538 02/19/15 0652 02/20/15 0555  WBC 6.8  9.4 13.8* 7.3  HGB 10.6* 11.5* 12.5* 11.2*  HCT 34.9* 37.4* 41.8 37.0*  PLT 377 410* 350 373    COAGS: No results for input(s): INR, APTT in the last 8760 hours.  BMP:  Recent Labs  02/15/15 0410 02/16/15 0524 02/19/15 0652 02/20/15 0555  NA 139 138 138 138  K 4.2 4.1 4.8 4.3  CL 109 105 109 109  CO2 25 26 20* 24  GLUCOSE 88 84 71 84  BUN CALCIUM 8.3* 8.4* 8.7* 8.3*  CREATININE 0.86 0.87 0.84 0.87  GFRNONAA >60 >60 >60 >60  GFRAA >60 >60 >60 >60    LIVER FUNCTION TESTS:  Recent Labs  02/15/15 0410  BILITOT 0.4  AST 21  ALT 31  ALKPHOS 65  PROT 5.7*  ALBUMIN 2.5*     Assessment and Plan: S/P perc drainage of RUQ abscess (cx's- MRSA) 10/7; afebrile; WBC nl; HGB 11.2, creat nl; cont current tx/drain irrigation; if pt remains inhouse would get f/u CT before end of week; otherwise will obtain f/u imaging at IR clinic [phone]); swallow eval ordered per primary team    Signed: D. Jeananne Rama 02/20/2015, 9:32 AM   I spent a total of 15 minutes at the the patient's bedside AND on the patient's hospital floor or unit, greater than 50% of which was counseling/coordinating care for

## 2015-02-20 NOTE — Progress Notes (Signed)
Report called to [facility].

## 2015-02-20 NOTE — Progress Notes (Signed)
Pt medically stable for discharge to [facility] snf pending pt evaluation for blue medicare auth. CSW paged PT for dc priority.   Olga CoasterKristen Teron Blais, LCSW  Clinical Social Work  [facility] [phone]

## 2015-02-21 ENCOUNTER — Other Ambulatory Visit: Payer: Self-pay | Admitting: Radiology

## 2015-02-21 ENCOUNTER — Other Ambulatory Visit (HOSPITAL_COMMUNITY): Payer: Self-pay | Admitting: Interventional Radiology

## 2015-02-21 ENCOUNTER — Other Ambulatory Visit: Payer: Self-pay | Admitting: Surgery

## 2015-02-21 DIAGNOSIS — K651 Peritoneal abscess: Principal | ICD-10-CM

## 2015-02-21 DIAGNOSIS — T814XXD Infection following a procedure, subsequent encounter: Principal | ICD-10-CM

## 2015-02-21 DIAGNOSIS — IMO0001 Reserved for inherently not codable concepts without codable children: Secondary | ICD-10-CM

## 2015-02-28 ENCOUNTER — Other Ambulatory Visit: Payer: Medicare Other

## 2015-03-06 ENCOUNTER — Other Ambulatory Visit (HOSPITAL_COMMUNITY): Payer: Self-pay | Admitting: Interventional Radiology

## 2015-03-06 ENCOUNTER — Ambulatory Visit
Admission: RE | Admit: 2015-03-06 | Discharge: 2015-03-06 | Disposition: A | Discharge status: Home / Self Care | Payer: Medicare Other | Source: Ambulatory Visit | Attending: Surgery | Admitting: Surgery

## 2015-03-06 ENCOUNTER — Other Ambulatory Visit: Payer: Self-pay | Admitting: Surgery

## 2015-03-06 ENCOUNTER — Ambulatory Visit
Admission: RE | Admit: 2015-03-06 | Discharge: 2015-03-06 | Disposition: A | Discharge status: Home / Self Care | Payer: Medicare Other | Source: Ambulatory Visit | Attending: Radiology | Admitting: Radiology

## 2015-03-06 DIAGNOSIS — T814XXD Infection following a procedure, subsequent encounter: Principal | ICD-10-CM

## 2015-03-06 DIAGNOSIS — IMO0001 Reserved for inherently not codable concepts without codable children: Secondary | ICD-10-CM

## 2015-03-06 DIAGNOSIS — K651 Peritoneal abscess: Principal | ICD-10-CM

## 2015-03-06 MED ORDER — IOPAMIDOL (ISOVUE-300) INJECTION 61%
100.0000 mL | Freq: Once | INTRAVENOUS | Status: AC | PRN
  Administered 2015-03-06: 100 mL via INTRAVENOUS
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
## 2015-03-10 ENCOUNTER — Emergency Department (HOSPITAL_COMMUNITY)
Admission: EM | Admit: 2015-03-10 | Discharge: 2015-03-10 | Disposition: A | Discharge status: Home / Self Care | Payer: Medicare Other | Attending: Emergency Medicine | Admitting: Emergency Medicine

## 2015-03-10 ENCOUNTER — Encounter (HOSPITAL_COMMUNITY): Payer: Self-pay | Admitting: Nurse Practitioner

## 2015-03-10 DIAGNOSIS — Z880 Allergy status to penicillin: Secondary | ICD-10-CM | POA: Diagnosis not present

## 2015-03-10 DIAGNOSIS — M1990 Unspecified osteoarthritis, unspecified site: Secondary | ICD-10-CM | POA: Diagnosis not present

## 2015-03-10 DIAGNOSIS — Z87820 Personal history of traumatic brain injury: Secondary | ICD-10-CM | POA: Diagnosis not present

## 2015-03-10 DIAGNOSIS — Z79899 Other long term (current) drug therapy: Secondary | ICD-10-CM | POA: Diagnosis not present

## 2015-03-10 DIAGNOSIS — Z8719 Personal history of other diseases of the digestive system: Secondary | ICD-10-CM | POA: Diagnosis not present

## 2015-03-10 DIAGNOSIS — Z7982 Long term (current) use of aspirin: Secondary | ICD-10-CM | POA: Insufficient documentation

## 2015-03-10 DIAGNOSIS — Y658 Other specified misadventures during surgical and medical care: Secondary | ICD-10-CM | POA: Insufficient documentation

## 2015-03-10 DIAGNOSIS — Z87891 Personal history of nicotine dependence: Secondary | ICD-10-CM | POA: Diagnosis not present

## 2015-03-10 DIAGNOSIS — F0391 Unspecified dementia with behavioral disturbance: Secondary | ICD-10-CM | POA: Insufficient documentation

## 2015-03-10 DIAGNOSIS — T8189XA Other complications of procedures, not elsewhere classified, initial encounter: Secondary | ICD-10-CM | POA: Diagnosis present

## 2015-03-10 DIAGNOSIS — I10 Essential (primary) hypertension: Secondary | ICD-10-CM | POA: Diagnosis not present

## 2015-03-10 DIAGNOSIS — L02211 Cutaneous abscess of abdominal wall: Secondary | ICD-10-CM | POA: Diagnosis not present

## 2015-03-10 DIAGNOSIS — Z8639 Personal history of other endocrine, nutritional and metabolic disease: Secondary | ICD-10-CM | POA: Insufficient documentation

## 2015-03-10 DIAGNOSIS — G8929 Other chronic pain: Secondary | ICD-10-CM | POA: Diagnosis not present

## 2015-03-10 LAB — BASIC METABOLIC PANEL
ANION GAP: 4 — AB (ref 5–15)
BUN: 19 mg/dL (ref 6–20)
CALCIUM: 8.8 mg/dL — AB (ref 8.9–10.3)
CO2: 29 mmol/L (ref 22–32)
Chloride: 107 mmol/L (ref 101–111)
Creatinine, Ser: 1.23 mg/dL (ref 0.61–1.24)
GFR, EST NON AFRICAN AMERICAN: 55 mL/min — AB (ref >60)
Glucose, Bld: 89 mg/dL (ref 65–99)
Potassium: 4.2 mmol/L (ref 3.5–5.1)
SODIUM: 140 mmol/L (ref 135–145)

## 2015-03-10 LAB — CBC WITH DIFFERENTIAL/PLATELET
BASOS ABS: 0.1 10*3/uL (ref 0.0–0.1)
Basophils Relative: 1 %
Eosinophils Absolute: 0.5 10*3/uL (ref 0.0–0.7)
Eosinophils Relative: 8 %
HCT: 40.3 % (ref 39.0–52.0)
Hemoglobin: 12.4 g/dL — ABNORMAL LOW (ref 13.0–17.0)
LYMPHS PCT: 23 %
Lymphs Abs: 1.5 10*3/uL (ref 0.7–4.0)
MCH: 23.8 pg — ABNORMAL LOW (ref 26.0–34.0)
MCHC: 30.8 g/dL (ref 30.0–36.0)
MCV: 77.4 fL — ABNORMAL LOW (ref 78.0–100.0)
MONOS PCT: 10 %
Monocytes Absolute: 0.7 10*3/uL (ref 0.1–1.0)
NEUTROS ABS: 3.9 10*3/uL (ref 1.7–7.7)
Neutrophils Relative %: 58 %
Platelets: 234 10*3/uL (ref 150–400)
RBC: 5.21 MIL/uL (ref 4.22–5.81)
RDW: 22.2 % — AB (ref 11.5–15.5)
WBC: 6.7 10*3/uL (ref 4.0–10.5)

## 2015-03-10 NOTE — ED Notes (Signed)
[facility] here to pick up pt.. 

## 2015-03-10 NOTE — ED Provider Notes (Signed)
CSN: [id number]     Arrival date & time 03/10/15  1722 History   First MD Initiated Contact with Patient 03/10/15 1727     Chief Complaint  Patient presents with  . Drainage Tube Complication      GB Drain     HPI  [name] is a 79 y.o. male with a PMH of TBI, hemiplegia, HTN, HLD, dementia who presents to the ED with drainage tube complication. Patient underwent drain placement for right thoracoabdominal wall abscess with IR 10/7 and was discharged from the hospital 10/12. He had a repeat CT last week, which demonstrated reduction in the size of his abscess, and is scheduled to have an additional CT in 2 weeks to re-evaluate whether or not his drainage tube can be removed. His family member is present at bedside, who states sometime between 4 PM yesterday and 4 PM today, his drainage tube fell out. She states he has been draining 5-40 cc throughout the day each day, and that his drainage bag is changed at least 3 times daily. He denies fever, chills, HA, lightheadedness, dizziness, chest pain, shortness of breath, abdominal pain, N/V/D/C.     Past Medical History  Diagnosis Date  . Hemiplegia (HCC) 09/07/1970    "horse fell on him"  . Vertigo   . Hypertension   . TBI (traumatic brain injury) (HCC) 09/07/1970    WITH LEFT HEMISPHERIC BLEED  . High cholesterol     "not on RX; taken off d/t vertigo fall 2014"  . Chronic pain     lower back "hasn't complained in months" (09/07/2013)  . Dementia   . Swelling of upper lip     tongue  . Weakness generalized 12/22/2012    "not a problem lately" (09/07/2013)  . Dementia with behavioral disturbance 04/18/2013  . Cholecystitis   . H/O hiatal hernia   . Arthritis     "fingers" (09/07/2013)  . Clonus     "RLE" "hadn't bothered him for awhile; at least 6 months" (09/07/2013)  . Familial tremor   . Stroke [facility])    Past Surgical History  Procedure Laterality Date  . Back surgery    . Total knee arthroplasty Right ~ 2000  . Posterior  laminectomy / decompression lumbar spine  2006  . Colon resection  1996  . Multiple tooth extractions  3/14    all upper teeth removed  . Esophagogastroduodenoscopy (egd) with esophageal dilation  ~ 2005  . Laparoscopic cholecystectomy  09/07/2013  . Inguinal hernia repair  1985  . Joint replacement    . Cholecystectomy N/A 09/07/2013    Procedure: LAPAROSCOPIC CHOLECYSTECTOMY ;  Surgeon: Shelly Rubenstein, MD;  Location: [facility] OR;  Service: General;  Laterality: N/A;   Family History  Problem Relation Age of Onset  . Tremor Maternal Grandfather    Social History  Substance Use Topics  . Smoking status: Former Smoker -- 4 years    Types: Cigarettes  . Smokeless tobacco: Never Used     Comment: quit ismoking n 1965  . Alcohol Use: No      Review of Systems  Constitutional: Negative for fever and chills.  Respiratory: Negative for shortness of breath.   Cardiovascular: Negative for chest pain.  Gastrointestinal: Negative for nausea, vomiting, abdominal pain, diarrhea and constipation.  Neurological: Negative for dizziness, light-headedness and headaches.  All other systems reviewed and are negative.     Allergies  Ace inhibitors and Penicillins  Home Medications   Prior to Admission  medications   Medication Sig Start Date End Date Taking? Authorizing Provider  albuterol (PROAIR HFA) 108 (90 BASE) MCG/ACT inhaler Inhale 2 puffs into the lungs every 3 (three) hours as needed for wheezing or shortness of breath.   Yes Historical Provider, MD  ascorbic acid (VITAMIN C) 1000 MG tablet Take 1,000 mg by mouth daily.   Yes Historical Provider, MD  aspirin 325 MG tablet Take 325 mg by mouth daily.   Yes Historical Provider, MD  baclofen (LIORESAL) 20 MG tablet Take 20 mg by mouth every evening.   Yes Historical Provider, MD  cholecalciferol (VITAMIN D) 1000 UNITS tablet Take 1,000 Units by mouth daily.     Yes Historical Provider, MD  clonazePAM (KLONOPIN) 0.5 MG tablet Take 1 tablet  (0.5 mg total) by mouth 2 (two) times daily as needed (anxiety). 08/09/13  Yes Alysia Penna, MD  cyanocobalamin 500 MCG tablet Take 500 mcg by mouth daily.   Yes Historical Provider, MD  Dextromethorphan-Quinidine (NUEDEXTA) 20-10 MG CAPS Take 1 capsule by mouth daily.   Yes Historical Provider, MD  docusate sodium (COLACE) 100 MG capsule Take 100 mg by mouth every evening.   Yes Historical Provider, MD  doxycycline (VIBRAMYCIN) 100 MG capsule Take 1 capsule (100 mg total) by mouth 2 (two) times daily. For 14days, may need longer course depending on Repeat Imaging 02/19/15  Yes Zannie Cove, MD  ferrous sulfate 325 (65 FE) MG tablet Take 325 mg by mouth 2 (two) times daily.   Yes Historical Provider, MD  finasteride (PROSCAR) 5 MG tablet Take 5 mg by mouth daily.   Yes Historical Provider, MD  Hypromellose (GENTEAL) 0.3 % SOLN Place 1 drop into both eyes 3 (three) times daily as needed (dry eyes).   Yes Historical Provider, MD  Liniments Chinita Pester) PADS Apply 1 each topically daily as needed (pain). Apply to left knee   Yes Historical Provider, MD  metoprolol succinate (TOPROL-XL) 50 MG 24 hr tablet Take 50 mg by mouth daily. Take with or immediately following a meal.   Yes Historical Provider, MD  mirtazapine (REMERON) 7.5 MG tablet Take 7.5 mg by mouth at bedtime.   Yes Historical Provider, MD  Multiple Vitamin (MULITIVITAMIN WITH MINERALS) TABS Take 1 tablet by mouth daily.     Yes Historical Provider, MD  polyethylene glycol (MIRALAX / GLYCOLAX) packet Take 17 g by mouth daily.   Yes Historical Provider, MD  QUEtiapine (SEROQUEL) 25 MG tablet Take 25 mg by mouth at bedtime.   Yes Historical Provider, MD    BP 131/51 mmHg  Pulse 83  Temp(Src) 98.2 F (36.8 C) (Oral)  Resp 18  SpO2 98% Physical Exam  Constitutional: He appears well-developed and well-nourished. No distress.  HENT:  Head: Normocephalic and atraumatic.  Right Ear: External ear normal.  Left Ear: External ear normal.   Nose: Nose normal.  Mouth/Throat: Uvula is midline, oropharynx is clear and moist and mucous membranes are normal.  Eyes: Conjunctivae, EOM and lids are normal. Pupils are equal, round, and reactive to light. Right eye exhibits no discharge. Left eye exhibits no discharge. No scleral icterus.  Neck: Normal range of motion. Neck supple.  Cardiovascular: Normal rate, regular rhythm, normal heart sounds, intact distal pulses and normal pulses.   Pulmonary/Chest: Effort normal and breath sounds normal. No respiratory distress. He has no wheezes. He has no rales.  Abdominal: Soft. Normal appearance and bowel sounds are normal. He exhibits no distension and no mass. There is no tenderness. There  is no rigidity, no rebound and no guarding.  Drainage site to right lateral abdominal wall. No significant erythema or edema. Appears clean, dry, and intact.  Musculoskeletal: Normal range of motion. He exhibits no edema or tenderness.  Neurological: He is alert.  Skin: Skin is warm, dry and intact. He is not diaphoretic.  Psychiatric: He has a normal mood and affect. His speech is normal and behavior is normal.  Nursing note and vitals reviewed.   ED Course  Procedures (including critical care time)  Labs Review Labs Reviewed  CBC WITH DIFFERENTIAL/PLATELET - Abnormal; Notable for the following:    Hemoglobin 12.4 (*)    MCV 77.4 (*)    MCH 23.8 (*)    RDW 22.2 (*)    All other components within normal limits  BASIC METABOLIC PANEL - Abnormal; Notable for the following:    Calcium 8.8 (*)    GFR calc non Af Amer 55 (*)    Anion gap 4 (*)    All other components within normal limits    Imaging Review No results found.   I have personally reviewed and evaluated these lab results as part of my medical decision-making.   EKG Interpretation None      MDM   Final diagnoses:  Abdominal wall abscess    79 year old male presents with drainage tube complication. Patient underwent drain  placement for right thoracoabdominal wall abscess with IR 10/7 and was discharged from hospital 10/12. Had a repeat CT last week, which demonstrated reduction in size of his abscess. Scheduled to have an additional CT in 2 weeks to re-evaluate whether or not his drainage tube can be removed. His family member is present at bedside, who states sometime between 4 PM yesterday and 4 PM today, his drainage tube fell out. Denies fever, chills, HA, lightheadedness, dizziness, chest pain, shortness of breath, abdominal pain, N/V/D/C.   Patient is afebrile. Vital signs stable. Heart regular rate and rhythm. Lungs clear to auscultation bilaterally. Abdomen soft, nontender, nondistended. Drainage site to right lateral abdominal wall. No significant erythema or edema. Appears clean, dry, and intact.  CBC negative for leukocytosis, hemoglobin 12.4. BMP unremarkable.  IR consulted. Spoke with Dr. Deanne CofferHassell, who advised patient should have drain replaced in outpatient setting. Patient will be called tomorrow by radiology regarding procedure. NPO after midnight. Discussed plan with patient and family member, who are in agreement. Return precautions discussed.   BP 131/51 mmHg  Pulse 83  Temp(Src) 98.2 F (36.8 C) (Oral)  Resp 18  SpO2 98%   Mady Gemmalizabeth C Sondi Desch, PA-C 03/11/15 0052  Tilden FossaElizabeth Rees, MD 03/11/15 [phone]

## 2015-03-10 NOTE — ED Notes (Signed)
Bed: WA04 Expected date:  Expected time:  Means of arrival:  Comments: EMS- wound drain problem

## 2015-03-10 NOTE — Discharge Instructions (Signed)
1. Medications: usual home medications 2. Treatment: rest, drink plenty of fluids 3. Follow Up: [place] [facility]ology (interventional radiology) will call you tomorrow morning to set up a procedure to replace drainage tube; please return to the ER for high fever, severe pain, new or worsening symptoms   Percutaneous Abscess Drain An abscess is a collection of infected fluid inside the body. Your health care provider may decide to remove or drain the infected fluid from the area by placing a thin needle into the abscess. Usually, a Parrales tube is left in place to drain the abscess fluid. The abscess fluid may take a few days to drain. LET [facility] CARE PROVIDER KNOW ABOUT:  Any allergies you have.  All medicines you are taking, including vitamins, herbs, eye drops, creams, and over-the-counter medicines. This includes steroid medicines by mouth or cream.  Previous problems you or members of your family have had with the use of anesthetics.  Any blood disorders you have.  Previous surgeries you have had.  Possibility of pregnancy, if this applies.  Medical conditions you have.  Any history of smoking. RISKS AND COMPLICATIONS Generally, this is a safe procedure. However, problems can occur and include:   Infection.  Allergic reaction to materials used (such as contrast dye).  Damage to a nearby organ or tissue.  Bleeding.  Blockage of a tube placed to drain the abscess, requiring placement of a new drainage tube.  A need to repeat the procedure.  Failure of the procedure to adequately drain the abscess, requiring an open surgical procedure to do so. BEFORE THE PROCEDURE   Ask your health care provider about:  Changing or stopping your regular medicines. This is especially important if you are taking diabetes medicines or blood thinners.  Taking medicines such as aspirin and ibuprofen. These medicines can thin your blood. Do not take these medicines before your procedure if  your health care provider asks you not to.  Your health care provider may do some blood or urine tests. These will help your health care provider learn how well your kidneys and liver are working and how well your blood clots.  Do not eat or drink anything after midnight on the night before the procedure or as directed by your health care provider.  Make arrangements for someone to drive you home after the procedure.  PROCEDURE   An IV tube will be placed in your arm. Medicine will be able to flow directly into your body through this tube.  You will lie on an X-ray table.  Your heart rate, blood pressure, and breathing will be monitored.   Your oxygen level will also be watched during the procedure. Supplemental oxygen may be given if necessary.  The skin around the area where the drainage tube (catheter) will be placed will be cleaned and numbed.  A Maeda cut (incision) will then be made to insert the drainage tube. The drainage tube will be inserted using X-ray or CT scan to help direct where it should be placed.  The drainage tube will be guided into the abscess to drain the infected fluid.  The drainage tube may stay in place and be connected to a bag outside your body. It will stay until the fluid has stopped draining and the infection is gone. AFTER THE PROCEDURE  You will be taken to a recovery area where you will stay until the medicines have worn off.  You will stay in bed for several hours.  Your progress will be  monitored.   Your blood pressure and pulse will be checked often.   The area of the incision will be checked often.  You may have some pain or feel sick. Tell your health care provider.  As you begin to feel better, you may be given ice, fluids, and food.   When you can walk, drink, eat, and use the bathroom, you may be able to go home.   This information is not intended to replace advice given to you by your health care provider. Make sure you  discuss any questions you have with your health care provider.   Document Released: 09/11/2013 Document Reviewed: 09/11/2013 Elsevier Interactive Patient Education [employer]2016 [employer]

## 2015-03-10 NOTE — ED Notes (Signed)
[facility], on his left lateral aspect of his chest is an abscess which there was a GB drain for an abscess per chart review. Only thing left is the dressing, the drain itself was not presented with the pt and medics unaware if it was left at facility. Pt at baseline neurologically, not c/o any pain.

## 2015-03-10 NOTE — ED Notes (Signed)
Asked Art to call [facility] for transport back to [facility].

## 2015-03-11 ENCOUNTER — Other Ambulatory Visit (HOSPITAL_COMMUNITY): Payer: Self-pay | Admitting: Interventional Radiology

## 2015-03-11 ENCOUNTER — Ambulatory Visit (HOSPITAL_COMMUNITY)
Admission: RE | Admit: 2015-03-11 | Discharge: 2015-03-11 | Disposition: A | Discharge status: Home / Self Care | Payer: Medicare Other | Source: Ambulatory Visit | Attending: Interventional Radiology | Admitting: Interventional Radiology

## 2015-03-11 DIAGNOSIS — L0291 Cutaneous abscess, unspecified: Secondary | ICD-10-CM

## 2015-03-11 DIAGNOSIS — Z4803 Encounter for change or removal of drains: Secondary | ICD-10-CM | POA: Diagnosis present

## 2015-03-11 DIAGNOSIS — K651 Peritoneal abscess: Secondary | ICD-10-CM | POA: Diagnosis not present

## 2015-03-11 MED ORDER — IOHEXOL 300 MG/ML  SOLN
50.0000 mL | Freq: Once | INTRAMUSCULAR | Status: DC | PRN
  Administered 2015-03-11: 5 mL via INTRAVENOUS
  Filled 2015-03-11: qty 50

## 2015-03-11 MED ORDER — LIDOCAINE HCL 1 % IJ SOLN
INTRAMUSCULAR | Status: AC
  Filled 2015-03-11: qty 20

## 2015-03-11 NOTE — Procedures (Signed)
Drainage catheter was completely dislodged.  Unable to replace drain with fluoroscopy.  No immediate complication.

## 2015-03-20 ENCOUNTER — Ambulatory Visit
Admission: RE | Admit: 2015-03-20 | Discharge: 2015-03-20 | Disposition: A | Discharge status: Home / Self Care | Payer: Medicare Other | Source: Ambulatory Visit | Attending: Interventional Radiology | Admitting: Interventional Radiology

## 2015-03-20 ENCOUNTER — Other Ambulatory Visit: Payer: Self-pay | Admitting: Radiology

## 2015-03-20 ENCOUNTER — Ambulatory Visit
Admission: RE | Admit: 2015-03-20 | Discharge: 2015-03-20 | Disposition: A | Discharge status: Home / Self Care | Payer: Medicare Other | Source: Ambulatory Visit | Attending: Surgery | Admitting: Surgery

## 2015-03-20 ENCOUNTER — Other Ambulatory Visit: Payer: Medicare Other

## 2015-03-20 DIAGNOSIS — K651 Peritoneal abscess: Principal | ICD-10-CM

## 2015-03-20 DIAGNOSIS — IMO0001 Reserved for inherently not codable concepts without codable children: Secondary | ICD-10-CM

## 2015-03-20 DIAGNOSIS — T814XXD Infection following a procedure, subsequent encounter: Principal | ICD-10-CM

## 2015-03-20 DIAGNOSIS — IMO0002 Reserved for concepts with insufficient information to code with codable children: Secondary | ICD-10-CM

## 2015-03-20 MED ORDER — IOPAMIDOL (ISOVUE-300) INJECTION 61%
100.0000 mL | Freq: Once | INTRAVENOUS | Status: AC | PRN
  Administered 2015-03-20: 100 mL via INTRAVENOUS
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
## 2015-03-21 ENCOUNTER — Other Ambulatory Visit (HOSPITAL_COMMUNITY): Payer: Self-pay | Admitting: Internal Medicine

## 2015-03-21 ENCOUNTER — Encounter (HOSPITAL_COMMUNITY): Payer: Self-pay

## 2015-03-21 ENCOUNTER — Ambulatory Visit (HOSPITAL_COMMUNITY)
Admission: RE | Admit: 2015-03-21 | Discharge: 2015-03-21 | Disposition: A | Discharge status: Home / Self Care | Payer: Medicare Other | Source: Ambulatory Visit | Attending: Internal Medicine | Admitting: Internal Medicine

## 2015-03-21 ENCOUNTER — Ambulatory Visit (HOSPITAL_COMMUNITY)
Admission: RE | Admit: 2015-03-21 | Discharge: 2015-03-21 | Disposition: A | Discharge status: Home / Self Care | Payer: Medicare Other | Source: Ambulatory Visit | Attending: Interventional Radiology | Admitting: Interventional Radiology

## 2015-03-21 ENCOUNTER — Ambulatory Visit (HOSPITAL_COMMUNITY)
Admission: RE | Admit: 2015-03-21 | Discharge: 2015-03-21 | Disposition: A | Discharge status: Home / Self Care | Payer: Medicare Other | Source: Ambulatory Visit | Attending: Radiology | Admitting: Radiology

## 2015-03-21 DIAGNOSIS — R188 Other ascites: Secondary | ICD-10-CM

## 2015-03-21 DIAGNOSIS — K651 Peritoneal abscess: Secondary | ICD-10-CM | POA: Insufficient documentation

## 2015-03-21 DIAGNOSIS — Z79899 Other long term (current) drug therapy: Secondary | ICD-10-CM | POA: Insufficient documentation

## 2015-03-21 DIAGNOSIS — A4902 Methicillin resistant Staphylococcus aureus infection, unspecified site: Secondary | ICD-10-CM | POA: Diagnosis not present

## 2015-03-21 DIAGNOSIS — Z9049 Acquired absence of other specified parts of digestive tract: Secondary | ICD-10-CM | POA: Insufficient documentation

## 2015-03-21 DIAGNOSIS — Z7982 Long term (current) use of aspirin: Secondary | ICD-10-CM | POA: Diagnosis not present

## 2015-03-21 LAB — CBC
HCT: 41.7 % (ref 39.0–52.0)
HEMOGLOBIN: 13 g/dL (ref 13.0–17.0)
MCH: 24.7 pg — AB (ref 26.0–34.0)
MCHC: 31.2 g/dL (ref 30.0–36.0)
MCV: 79.1 fL (ref 78.0–100.0)
PLATELETS: 290 10*3/uL (ref 150–400)
RBC: 5.27 MIL/uL (ref 4.22–5.81)
RDW: 20.5 % — ABNORMAL HIGH (ref 11.5–15.5)
WBC: 6.9 10*3/uL (ref 4.0–10.5)

## 2015-03-21 LAB — PROTIME-INR
INR: 1.05 (ref 0.00–1.49)
PROTHROMBIN TIME: 13.9 s (ref 11.6–15.2)

## 2015-03-21 MED ORDER — MIDAZOLAM HCL 2 MG/2ML IJ SOLN
INTRAMUSCULAR | Status: AC
  Filled 2015-03-21: qty 6

## 2015-03-21 MED ORDER — HYDROCODONE-ACETAMINOPHEN 5-325 MG PO TABS
1.0000 | ORAL_TABLET | ORAL | Status: DC | PRN
  Filled 2015-03-21: qty 2

## 2015-03-21 MED ORDER — FENTANYL CITRATE (PF) 100 MCG/2ML IJ SOLN
INTRAMUSCULAR | Status: AC
  Filled 2015-03-21: qty 4

## 2015-03-21 MED ORDER — FENTANYL CITRATE (PF) 100 MCG/2ML IJ SOLN
INTRAMUSCULAR | Status: DC | PRN
  Administered 2015-03-21: 50 ug via INTRAVENOUS

## 2015-03-21 MED ORDER — MIDAZOLAM HCL 2 MG/2ML IJ SOLN
INTRAMUSCULAR | Status: DC | PRN
  Administered 2015-03-21: 1 mg via INTRAVENOUS

## 2015-03-21 MED ORDER — SODIUM CHLORIDE 0.9 % IV SOLN
INTRAVENOUS | Status: DC
  Administered 2015-03-21: 13:00:00 via INTRAVENOUS

## 2015-03-21 NOTE — H&P (Signed)
HPI:  The patient has had a H&P performed within the last 30 days, all history, medications, and exam have been reviewed. The patient denies any interval changes since the H&P.  Medications: Prior to Admission medications   Medication Sig Start Date End Date Taking? Authorizing Provider  ascorbic acid (VITAMIN C) 1000 MG tablet Take 1,000 mg by mouth daily.   Yes Historical Provider, MD  aspirin 325 MG tablet Take 325 mg by mouth daily.   Yes Historical Provider, MD  baclofen (LIORESAL) 20 MG tablet Take 20 mg by mouth every evening.   Yes Historical Provider, MD  cholecalciferol (VITAMIN D) 1000 UNITS tablet Take 1,000 Units by mouth daily.     Yes Historical Provider, MD  cyanocobalamin 500 MCG tablet Take 500 mcg by mouth daily.   Yes Historical Provider, MD  Dextromethorphan-Quinidine (NUEDEXTA) 20-10 MG CAPS Take 1 capsule by mouth daily.   Yes Historical Provider, MD  docusate sodium (COLACE) 100 MG capsule Take 100 mg by mouth every evening.   Yes Historical Provider, MD  doxycycline (VIBRAMYCIN) 100 MG capsule Take 1 capsule (100 mg total) by mouth 2 (two) times daily. For 14days, may need longer course depending on Repeat Imaging 02/19/15  Yes Zannie CovePreetha Joseph, MD  ferrous sulfate 325 (65 FE) MG tablet Take 325 mg by mouth 2 (two) times daily.   Yes Historical Provider, MD  finasteride (PROSCAR) 5 MG tablet Take 5 mg by mouth daily.   Yes Historical Provider, MD  metoprolol succinate (TOPROL-XL) 50 MG 24 hr tablet Take 50 mg by mouth daily. Take with or immediately following a meal.   Yes Historical Provider, MD  mirtazapine (REMERON) 7.5 MG tablet Take 7.5 mg by mouth at bedtime.   Yes Historical Provider, MD  Multiple Vitamin (MULITIVITAMIN WITH MINERALS) TABS Take 1 tablet by mouth daily.     Yes Historical Provider, MD  polyethylene glycol (MIRALAX / GLYCOLAX) packet Take 17 g by mouth daily.   Yes Historical Provider, MD  QUEtiapine (SEROQUEL) 25 MG tablet Take 25 mg by mouth at  bedtime.   Yes Historical Provider, MD  albuterol (PROAIR HFA) 108 (90 BASE) MCG/ACT inhaler Inhale 2 puffs into the lungs every 3 (three) hours as needed for wheezing or shortness of breath.    Historical Provider, MD  clonazePAM (KLONOPIN) 0.5 MG tablet Take 1 tablet (0.5 mg total) by mouth 2 (two) times daily as needed (anxiety). 08/09/13   Alysia PennaScott Holwerda, MD  Hypromellose (GENTEAL) 0.3 % SOLN Place 1 drop into both eyes 3 (three) times daily as needed (dry eyes).    Historical Provider, MD  Liniments Chinita Pester(SALONPAS) PADS Apply 1 each topically daily as needed (pain). Apply to left knee    Historical Provider, MD     Vital Signs: BP 123/65 mmHg  Pulse 51  Temp(Src) 97.7 F (36.5 C) (Oral)  Resp 16  Ht 6\' 2"  (1.88 m)  Wt 164 lb 2 oz (74.447 kg)  BMI 21.06 kg/m2  Physical Exam ENT: unremarkable Lungs: CTA Heart: regular Abd: soft, NT  Mallampati Score:  MD Evaluation Airway: WNL Heart: WNL Abdomen: WNL Chest/ Lungs: WNL ASA  Classification: 3 Mallampati/Airway Score: Two  Labs:  CBC:  Recent Labs  02/19/15 0652 02/20/15 0555 03/10/15 1853 03/21/15 1245  WBC 13.8* 7.3 6.7 6.9  HGB 12.5* 11.2* 12.4* 13.0  HCT 41.8 37.0* 40.3 41.7  PLT 350 373 234 290    COAGS:  Recent Labs  03/21/15 1245  INR 1.05  BMP:  Recent Labs  02/16/15 0524 02/19/15 0652 02/20/15 0555 03/10/15 1853  NA 138 138 138 140  K 4.1 4.8 4.3 4.2  CL 105 109 109 107  CO2 26 20* 24 29  GLUCOSE 84 71 84 89  BUN CALCIUM 8.4* 8.7* 8.3* 8.8*  CREATININE 0.87 0.84 0.87 1.23  GFRNONAA >60 >60 >60 55*  GFRAA >60 >60 >60 >60    LIVER FUNCTION TESTS:  Recent Labs  02/15/15 0410  BILITOT 0.4  AST 21  ALT 31  ALKPHOS 65  PROT 5.7*  ALBUMIN 2.5*    Assessment/Plan:  RUQ abscess For CT drain Risks and Benefits discussed with the patient including bleeding, infection, damage to adjacent structures, bowel perforation/fistula connection, and sepsis. All of the patient's  questions were answered, patient is agreeable to proceed. Consent signed by wife in chart.   SignedBrayton El 03/21/2015, 2:16 PM

## 2015-03-21 NOTE — Procedures (Signed)
US placement of 1052f  Drain into perihepatic collection 35ml purulent aspirate, sample for GS, C&S No complication No blood loss. [facility].

## 2015-03-21 NOTE — Discharge Instructions (Signed)
Drain placement instructions :   Call interventional radiology if problems/questions regarding drain [phone]  Change gauze dressing around insertion site daily Call Md for any redness, drainage, swelling,  Call MD for fever or severe pain at insertion site  Flush with 5cc Normal Saline flushes to tube (where clear tube from drainage bag connects to white part).  Do not need to pull back (aspirate) just push in gently.  Do NOT push if meeting resistance.  Call MD if any problems flushing.     Moderate Conscious Sedation, Adult, Care After Refer to this sheet in the next few weeks. These instructions provide you with information on caring for yourself after your procedure. Your health care provider may also give you more specific instructions. Your treatment has been planned according to current medical practices, but problems sometimes occur. Call your health care provider if you have any problems or questions after your procedure. WHAT TO EXPECT AFTER THE PROCEDURE  After your procedure:  You may feel sleepy, clumsy, and have poor balance for several hours.  Vomiting may occur if you eat too soon after the procedure. HOME CARE INSTRUCTIONS  Do not participate in any activities where you could become injured for at least 24 hours. Do not:  Drive.  Swim.  Ride a bicycle.  Operate heavy machinery.  Cook.  Use power tools.  Climb ladders.  Work from a high place.  Do not make important decisions or sign legal documents until you are improved.  If you vomit, drink water, juice, or soup when you can drink without vomiting. Make sure you have little or no nausea before eating solid foods.  Only take over-the-counter or prescription medicines for pain, discomfort, or fever as directed by your health care provider.  Make sure you and your family fully understand everything about the medicines given to you, including what side effects may occur.  You should not drink  alcohol, take sleeping pills, or take medicines that cause drowsiness for at least 24 hours.  If you smoke, do not smoke without supervision.  If you are feeling better, you may resume normal activities 24 hours after you were sedated.  Keep all appointments with your health care provider. SEEK MEDICAL CARE IF:  Your skin is pale or bluish in color.  You continue to feel nauseous or vomit.  Your pain is getting worse and is not helped by medicine.  You have bleeding or swelling.  You are still sleepy or feeling clumsy after 24 hours. SEEK IMMEDIATE MEDICAL CARE IF:  You develop a rash.  You have difficulty breathing.  You develop any type of allergic problem.  You have a fever. MAKE SURE YOU:  Understand these instructions.  Will watch your condition.  Will get help right away if you are not doing well or get worse.   This information is not intended to replace advice given to you by your health care provider. Make sure you discuss any questions you have with your health care provider.   Document Released: 02/15/2013 Document Revised: 05/18/2014 Document Reviewed: 02/15/2013 Elsevier Interactive Patient Education [employer]2016 [employer]

## 2015-03-21 NOTE — Progress Notes (Signed)
Patient to return back to nursing facility post IR procedure. Called and spoke to [facility] transportation regarding return transportation. Discharge time ordered at1800. [facility] after hour number if needed is [phone]. Patient will be ready for d/c back at 1800. Resting comfortably in room at present with wife.

## 2015-03-21 NOTE — Discharge Instructions (Signed)
Percutaneous Abscess Drain, Care After Refer to this sheet in the next few weeks. These instructions provide you with information on caring for yourself after your procedure. Your health care provider may also give you more specific instructions. Your treatment has been planned according to current medical practices, but problems sometimes occur. Call your health care provider if you have any problems or questions after your procedure. WHAT TO EXPECT AFTER THE PROCEDURE After your procedure, it is typical to have the following:   A Meister amount of discomfort in the area where the drainage tube was placed.  A Knoblock amount of bruising around the area where the drainage tube was placed.  Sleepiness and fatigue for the rest of the day from the medicines used. HOME CARE INSTRUCTIONS  Rest at home for 1-2 days following your procedure or as directed by your health care provider.  If you go home right after the procedure, plan to have someone with you for 24 hours.  Do not take a bathor shower for 24 hours after your procedure.  Take medicines only as directed by your health care provider. Ask your health care provider when you can resume taking any normal medicines.  Change bandages (dressings) as directed.   You may be told to record the amount of drainage from the bag every time you empty it. Follow your health care provider's directions for emptying the bag. Write down the amount of drainage, the date, and the time you emptied it.  Call your health care provider when the drain is putting out less than 10 mL of drainage per day for 2-3 days in a row or as directed by your health care provider.  Follow your health care provider's instructions for cleaning the drainage tube. You may need to clean the tube every day so that it does not clog. SEEK MEDICAL CARE IF:  You have increased bleeding (more than a Dia spot) from the site where the drainage tube was placed.  You have redness,  swelling, or increasing pain around the site where the drainage tube was placed.  You notice a discharge or bad smell coming from the site where the drainage tube was placed.  You have a fever or chills.  You have pain that is not helped by medicine.  SEEK IMMEDIATE MEDICAL CARE IF:  There is leakage around the drainage tube.  The drainage tube pulls out.  You suddenly stop having drainage from the tube.  You suddenly have blood in the drainage fluid.  You become dizzy or faint.  You develop a rash.   You have nausea or vomiting.  You have difficulty breathing, feel short of breath, or feel faint.   You develop chest pain.  You have problems with your speech or vision.  You have trouble balancing or moving your arms or legs.   This information is not intended to replace advice given to you by your health care provider. Make sure you discuss any questions you have with your health care provider.   Document Released: 09/11/2013 Document Revised: 02/13/2014 Document Reviewed: 09/11/2013 Elsevier Interactive Patient Education 2016 Elsevier Inc. Percutaneous Abscess Drain An abscess is a collection of infected fluid inside the body. Your health care provider may decide to remove or drain the infected fluid from the area by placing a thin needle into the abscess. Usually, a Enright tube is left in place to drain the abscess fluid. The abscess fluid may take a few days to drain. LET [facility] CARE PROVIDER  KNOW ABOUT:  Any allergies you have.  All medicines you are taking, including vitamins, herbs, eye drops, creams, and over-the-counter medicines. This includes steroid medicines by mouth or cream.  Previous problems you or members of your family have had with the use of anesthetics.  Any blood disorders you have.  Previous surgeries you have had.  Possibility of pregnancy, if this applies.  Medical conditions you have.  Any history of smoking. RISKS AND  COMPLICATIONS Generally, this is a safe procedure. However, problems can occur and include:   Infection.  Allergic reaction to materials used (such as contrast dye).  Damage to a nearby organ or tissue.  Bleeding.  Blockage of a tube placed to drain the abscess, requiring placement of a new drainage tube.  A need to repeat the procedure.  Failure of the procedure to adequately drain the abscess, requiring an open surgical procedure to do so. BEFORE THE PROCEDURE   Ask your health care provider about:  Changing or stopping your regular medicines. This is especially important if you are taking diabetes medicines or blood thinners.  Taking medicines such as aspirin and ibuprofen. These medicines can thin your blood. Do not take these medicines before your procedure if your health care provider asks you not to.  Your health care provider may do some blood or urine tests. These will help your health care provider learn how well your kidneys and liver are working and how well your blood clots.  Do not eat or drink anything after midnight on the night before the procedure or as directed by your health care provider.  Make arrangements for someone to drive you home after the procedure.  PROCEDURE   An IV tube will be placed in your arm. Medicine will be able to flow directly into your body through this tube.  You will lie on an X-ray table.  Your heart rate, blood pressure, and breathing will be monitored.   Your oxygen level will also be watched during the procedure. Supplemental oxygen may be given if necessary.  The skin around the area where the drainage tube (catheter) will be placed will be cleaned and numbed.  A Bonello cut (incision) will then be made to insert the drainage tube. The drainage tube will be inserted using X-ray or CT scan to help direct where it should be placed.  The drainage tube will be guided into the abscess to drain the infected fluid.  The drainage  tube may stay in place and be connected to a bag outside your body. It will stay until the fluid has stopped draining and the infection is gone. AFTER THE PROCEDURE  You will be taken to a recovery area where you will stay until the medicines have worn off.  You will stay in bed for several hours.  Your progress will be monitored.   Your blood pressure and pulse will be checked often.   The area of the incision will be checked often.  You may have some pain or feel sick. Tell your health care provider.  As you begin to feel better, you may be given ice, fluids, and food.   When you can walk, drink, eat, and use the bathroom, you may be able to go home.   This information is not intended to replace advice given to you by your health care provider. Make sure you discuss any questions you have with your health care provider.   Document Released: 09/11/2013 Document Reviewed: 09/11/2013 Elsevier Interactive Patient Education  2016 Elsevier Inc. Moderate Conscious Sedation, Adult Sedation is the use of medicines to promote relaxation and relieve discomfort and anxiety. Moderate conscious sedation is a type of sedation. Under moderate conscious sedation you are less alert than normal but are still able to respond to instructions or stimulation. Moderate conscious sedation is used during short medical and dental procedures. It is milder than deep sedation or general anesthesia and allows you to return to your regular activities sooner. LET [facility] CARE PROVIDER KNOW ABOUT:   Any allergies you have.  All medicines you are taking, including vitamins, herbs, eye drops, creams, and over-the-counter medicines.  Use of steroids (by mouth or creams).  Previous problems you or members of your family have had with the use of anesthetics.  Any blood disorders you have.  Previous surgeries you have had.  Medical conditions you have.  Possibility of pregnancy, if this applies.  Use  of cigarettes, alcohol, or illegal drugs. RISKS AND COMPLICATIONS Generally, this is a safe procedure. However, as with any procedure, problems can occur. Possible problems include:  Oversedation.  Trouble breathing on your own. You may need to have a breathing tube until you are awake and breathing on your own.  Allergic reaction to any of the medicines used for the procedure. BEFORE THE PROCEDURE  You may have blood tests done. These tests can help show how well your kidneys and liver are working. They can also show how well your blood clots.  A physical exam will be done.  Only take medicines as directed by your health care provider. You may need to stop taking medicines (such as blood thinners, aspirin, or nonsteroidal anti-inflammatory drugs) before the procedure.   Do not eat or drink at least 6 hours before the procedure or as directed by your health care provider.  Arrange for a responsible adult, family member, or friend to take you home after the procedure. He or she should stay with you for at least 24 hours after the procedure, until the medicine has worn off. PROCEDURE   An intravenous (IV) catheter will be inserted into one of your veins. Medicine will be able to flow directly into your body through this catheter. You may be given medicine through this tube to help prevent pain and help you relax.  The medical or dental procedure will be done. AFTER THE PROCEDURE  You will stay in a recovery area until the medicine has worn off. Your blood pressure and pulse will be checked.   Depending on the procedure you had, you may be allowed to go home when you can tolerate liquids and your pain is under control.   This information is not intended to replace advice given to you by your health care provider. Make sure you discuss any questions you have with your health care provider.   Document Released: 01/20/2001 Document Revised: 05/18/2014 Document Reviewed:  01/02/2013 Elsevier Interactive Patient Education [employer]

## 2015-03-22 ENCOUNTER — Ambulatory Visit (HOSPITAL_COMMUNITY): Admission: RE | Admit: 2015-03-22 | Payer: Medicare Other | Source: Ambulatory Visit

## 2015-03-24 LAB — CULTURE, ROUTINE-ABSCESS

## 2015-03-27 ENCOUNTER — Other Ambulatory Visit (HOSPITAL_COMMUNITY): Payer: Self-pay | Admitting: Interventional Radiology

## 2015-03-27 ENCOUNTER — Other Ambulatory Visit: Payer: Self-pay | Admitting: Surgery

## 2015-03-27 DIAGNOSIS — K651 Peritoneal abscess: Secondary | ICD-10-CM

## 2015-03-27 DIAGNOSIS — IMO0001 Reserved for inherently not codable concepts without codable children: Secondary | ICD-10-CM

## 2015-03-27 DIAGNOSIS — T814XXD Infection following a procedure, subsequent encounter: Principal | ICD-10-CM

## 2015-04-01 ENCOUNTER — Emergency Department (HOSPITAL_COMMUNITY): Payer: Medicare Other

## 2015-04-01 ENCOUNTER — Emergency Department (HOSPITAL_COMMUNITY)
Admission: EM | Admit: 2015-04-01 | Discharge: 2015-04-01 | Disposition: A | Discharge status: Home / Self Care | Payer: Medicare Other | Attending: Emergency Medicine | Admitting: Emergency Medicine

## 2015-04-01 ENCOUNTER — Encounter (HOSPITAL_COMMUNITY): Payer: Self-pay | Admitting: Emergency Medicine

## 2015-04-01 DIAGNOSIS — Z8639 Personal history of other endocrine, nutritional and metabolic disease: Secondary | ICD-10-CM | POA: Diagnosis not present

## 2015-04-01 DIAGNOSIS — F0391 Unspecified dementia with behavioral disturbance: Secondary | ICD-10-CM | POA: Insufficient documentation

## 2015-04-01 DIAGNOSIS — L02213 Cutaneous abscess of chest wall: Secondary | ICD-10-CM | POA: Diagnosis not present

## 2015-04-01 DIAGNOSIS — Z87891 Personal history of nicotine dependence: Secondary | ICD-10-CM | POA: Insufficient documentation

## 2015-04-01 DIAGNOSIS — Z8673 Personal history of transient ischemic attack (TIA), and cerebral infarction without residual deficits: Secondary | ICD-10-CM | POA: Insufficient documentation

## 2015-04-01 DIAGNOSIS — M1990 Unspecified osteoarthritis, unspecified site: Secondary | ICD-10-CM | POA: Insufficient documentation

## 2015-04-01 DIAGNOSIS — Z792 Long term (current) use of antibiotics: Secondary | ICD-10-CM | POA: Diagnosis not present

## 2015-04-01 DIAGNOSIS — Z880 Allergy status to penicillin: Secondary | ICD-10-CM | POA: Insufficient documentation

## 2015-04-01 DIAGNOSIS — Z8719 Personal history of other diseases of the digestive system: Secondary | ICD-10-CM | POA: Diagnosis not present

## 2015-04-01 DIAGNOSIS — Z79899 Other long term (current) drug therapy: Secondary | ICD-10-CM | POA: Diagnosis not present

## 2015-04-01 DIAGNOSIS — Z7982 Long term (current) use of aspirin: Secondary | ICD-10-CM | POA: Diagnosis not present

## 2015-04-01 DIAGNOSIS — J869 Pyothorax without fistula: Secondary | ICD-10-CM

## 2015-04-01 DIAGNOSIS — G8929 Other chronic pain: Secondary | ICD-10-CM | POA: Diagnosis not present

## 2015-04-01 DIAGNOSIS — I10 Essential (primary) hypertension: Secondary | ICD-10-CM | POA: Insufficient documentation

## 2015-04-01 LAB — URINE MICROSCOPIC-ADD ON: Bacteria, UA: NONE SEEN

## 2015-04-01 LAB — CBC WITH DIFFERENTIAL/PLATELET
BASOS PCT: 0 %
Basophils Absolute: 0 10*3/uL (ref 0.0–0.1)
Eosinophils Absolute: 0.3 10*3/uL (ref 0.0–0.7)
Eosinophils Relative: 3 %
HEMATOCRIT: 40.6 % (ref 39.0–52.0)
Hemoglobin: 12.7 g/dL — ABNORMAL LOW (ref 13.0–17.0)
LYMPHS ABS: 1.4 10*3/uL (ref 0.7–4.0)
Lymphocytes Relative: 14 %
MCH: 24.6 pg — AB (ref 26.0–34.0)
MCHC: 31.3 g/dL (ref 30.0–36.0)
MCV: 78.7 fL (ref 78.0–100.0)
MONO ABS: 0.9 10*3/uL (ref 0.1–1.0)
MONOS PCT: 9 %
NEUTROS ABS: 7 10*3/uL (ref 1.7–7.7)
Neutrophils Relative %: 74 %
Platelets: 294 10*3/uL (ref 150–400)
RBC: 5.16 MIL/uL (ref 4.22–5.81)
RDW: 19.1 % — AB (ref 11.5–15.5)
WBC: 9.6 10*3/uL (ref 4.0–10.5)

## 2015-04-01 LAB — URINALYSIS, ROUTINE W REFLEX MICROSCOPIC
BILIRUBIN URINE: NEGATIVE
GLUCOSE, UA: NEGATIVE mg/dL
KETONES UR: NEGATIVE mg/dL
Leukocytes, UA: NEGATIVE
NITRITE: NEGATIVE
PH: 7 (ref 5.0–8.0)
Protein, ur: NEGATIVE mg/dL
Specific Gravity, Urine: 1.006 (ref 1.005–1.030)

## 2015-04-01 LAB — COMPREHENSIVE METABOLIC PANEL
ALBUMIN: 3.2 g/dL — AB (ref 3.5–5.0)
ALK PHOS: 80 U/L (ref 38–126)
ALT: 19 U/L (ref 17–63)
AST: 22 U/L (ref 15–41)
Anion gap: 7 (ref 5–15)
BUN: 15 mg/dL (ref 6–20)
CALCIUM: 8.8 mg/dL — AB (ref 8.9–10.3)
CO2: 26 mmol/L (ref 22–32)
CREATININE: 0.99 mg/dL (ref 0.61–1.24)
Chloride: 105 mmol/L (ref 101–111)
GFR calc Af Amer: >60 mL/min (ref >60)
GFR calc non Af Amer: >60 mL/min (ref >60)
GLUCOSE: 92 mg/dL (ref 65–99)
Potassium: 4.3 mmol/L (ref 3.5–5.1)
SODIUM: 138 mmol/L (ref 135–145)
Total Bilirubin: 0.6 mg/dL (ref 0.3–1.2)
Total Protein: 6.5 g/dL (ref 6.5–8.1)

## 2015-04-01 NOTE — ED Provider Notes (Signed)
CSN: [id number]     Arrival date & time 04/01/15  1710 History   First MD Initiated Contact with Patient 04/01/15 1743     Chief Complaint  Patient presents with  . Abscess  . Drain Issue     Low 5 caveat due to dementia. (Consider location/radiation/quality/duration/timing/severity/associated sxs/prior Treatment) Patient is a 79 y.o. male presenting with abscess. The history is provided by the patient.  Abscess  patient presents from the nursing home. Patient has a right upper quadrant drain for a perihepatic abscess. He had previous cholecystitis. Reportedly the drain has been draining less may have come out some. White count is reportedly been going up. He also has had some nausea and vomiting. Otherwise he is without complaints. He has chronic right-sided weakness and numbness. No fevers.  Past Medical History  Diagnosis Date  . Hemiplegia (HCC) 09/07/1970    "horse fell on him"  . Vertigo   . Hypertension   . TBI (traumatic brain injury) (HCC) 09/07/1970    WITH LEFT HEMISPHERIC BLEED  . High cholesterol     "not on RX; taken off d/t vertigo fall 2014"  . Chronic pain     lower back "hasn't complained in months" (09/07/2013)  . Dementia   . Swelling of upper lip     tongue  . Weakness generalized 12/22/2012    "not a problem lately" (09/07/2013)  . Dementia with behavioral disturbance 04/18/2013  . Cholecystitis   . H/O hiatal hernia   . Arthritis     "fingers" (09/07/2013)  . Clonus     "RLE" "hadn't bothered him for awhile; at least 6 months" (09/07/2013)  . Familial tremor   . Stroke [facility])    Past Surgical History  Procedure Laterality Date  . Back surgery    . Total knee arthroplasty Right ~ 2000  . Posterior laminectomy / decompression lumbar spine  2006  . Colon resection  1996  . Multiple tooth extractions  3/14    all upper teeth removed  . Esophagogastroduodenoscopy (egd) with esophageal dilation  ~ 2005  . Laparoscopic cholecystectomy  09/07/2013  . Inguinal  hernia repair  1985  . Joint replacement    . Cholecystectomy N/A 09/07/2013    Procedure: LAPAROSCOPIC CHOLECYSTECTOMY ;  Surgeon: Shelly Rubenstein, MD;  Location: [facility] OR;  Service: General;  Laterality: N/A;   Family History  Problem Relation Age of Onset  . Tremor Maternal Grandfather    Social History  Substance Use Topics  . Smoking status: Former Smoker -- 4 years    Types: Cigarettes  . Smokeless tobacco: Never Used     Comment: quit ismoking n 1965  . Alcohol Use: No    Review of Systems  Unable to perform ROS: Dementia      Allergies  Ace inhibitors and Penicillins  Home Medications   Prior to Admission medications   Medication Sig Start Date End Date Taking? Authorizing Provider  albuterol (PROAIR HFA) 108 (90 BASE) MCG/ACT inhaler Inhale 2 puffs into the lungs every 3 (three) hours as needed for wheezing or shortness of breath.   Yes Historical Provider, MD  ascorbic acid (VITAMIN C) 1000 MG tablet Take 1,000 mg by mouth daily.   Yes Historical Provider, MD  aspirin 325 MG tablet Take 325 mg by mouth daily.   Yes Historical Provider, MD  baclofen (LIORESAL) 20 MG tablet Take 20 mg by mouth every evening.   Yes Historical Provider, MD  cholecalciferol (VITAMIN D) 1000 UNITS tablet  Take 1,000 Units by mouth daily.     Yes Historical Provider, MD  clonazePAM (KLONOPIN) 0.5 MG tablet Take 1 tablet (0.5 mg total) by mouth 2 (two) times daily as needed (anxiety). 08/09/13  Yes Alysia Penna, MD  cyanocobalamin 500 MCG tablet Take 500 mcg by mouth daily.   Yes Historical Provider, MD  Dextromethorphan-Quinidine (NUEDEXTA) 20-10 MG CAPS Take 1 capsule by mouth daily.   Yes Historical Provider, MD  docusate sodium (COLACE) 100 MG capsule Take 100 mg by mouth every evening.   Yes Historical Provider, MD  doxycycline (ADOXA) 100 MG tablet Take 100 mg by mouth 2 (two) times daily.   Yes Historical Provider, MD  ferrous sulfate 325 (65 FE) MG tablet Take 325 mg by mouth 2 (two)  times daily.   Yes Historical Provider, MD  finasteride (PROSCAR) 5 MG tablet Take 5 mg by mouth daily.   Yes Historical Provider, MD  Hypromellose (GENTEAL) 0.3 % SOLN Place 1 drop into both eyes 3 (three) times daily as needed (dry eyes).   Yes Historical Provider, MD  Liniments Chinita Pester) PADS Apply 1 each topically daily as needed (pain). Apply to left knee   Yes Historical Provider, MD  metoprolol succinate (TOPROL-XL) 50 MG 24 hr tablet Take 50 mg by mouth daily. Take with or immediately following a meal.   Yes Historical Provider, MD  mirtazapine (REMERON) 7.5 MG tablet Take 7.5 mg by mouth at bedtime.   Yes Historical Provider, MD  Multiple Vitamin (MULITIVITAMIN WITH MINERALS) TABS Take 1 tablet by mouth daily.     Yes Historical Provider, MD  polyethylene glycol (MIRALAX / GLYCOLAX) packet Take 17 g by mouth daily.   Yes Historical Provider, MD  QUEtiapine (SEROQUEL) 25 MG tablet Take 25 mg by mouth at bedtime.   Yes Historical Provider, MD  saccharomyces boulardii (FLORASTOR) 250 MG capsule Take 250 mg by mouth 2 (two) times daily.   Yes Historical Provider, MD  doxycycline (VIBRAMYCIN) 100 MG capsule Take 1 capsule (100 mg total) by mouth 2 (two) times daily. For 14days, may need longer course depending on Repeat Imaging Patient not taking: Reported on 04/01/2015 02/19/15   Zannie Cove, MD   BP 146/80 mmHg  Pulse 72  Temp(Src) 98 F (36.7 C) (Oral)  Resp 18  SpO2 99% Physical Exam  Constitutional: He appears well-developed.  HENT:  Head: Atraumatic.  Cardiovascular: Normal rate.   Pulmonary/Chest: Effort normal.  Abdominal: Soft. There is no tenderness.  Right sided lower chest drain. The clamp has opened and while the tube is still sutured in and has been extended to the length of the suture.  Neurological: He is alert.  Chronic right-sided weakness.  Skin: Skin is warm.    ED Course  Procedures (including critical care time) Labs Review Labs Reviewed   COMPREHENSIVE METABOLIC PANEL - Abnormal; Notable for the following:    Calcium 8.8 (*)    Albumin 3.2 (*)    All other components within normal limits  CBC WITH DIFFERENTIAL/PLATELET - Abnormal; Notable for the following:    Hemoglobin 12.7 (*)    MCH 24.6 (*)    RDW 19.1 (*)    All other components within normal limits  URINALYSIS, ROUTINE W REFLEX MICROSCOPIC (NOT AT [facility]) - Abnormal; Notable for the following:    APPearance CLOUDY (*)    Hgb urine dipstick TRACE (*)    All other components within normal limits  URINE MICROSCOPIC-ADD ON - Abnormal; Notable for the following:  Squamous Epithelial / LPF 0-5 (*)    All other components within normal limits    Imaging Review Dg Chest 2 View  04/01/2015  CLINICAL DATA:  Right upper quadrant abscess with 2 drains placed since 02/13/2015. Nausea and vomiting. Shortness of breath. EXAM: CHEST  2 VIEW COMPARISON:  CT abdomen 03/20/2015. Fluoroscopy from catheter tube change 03/11/2015. FINDINGS: Right posterior medial diaphragmatic hernia containing gas likely within bowel. Hernia was demonstrated on previous CT chest and abdomen 03/20/2015. Atelectasis in the lung bases. Heart size and pulmonary vascularity are normal for technique. Degenerative changes in the shoulders. Pigtail drainage catheter is partially visualized in the right upper quadrant. IMPRESSION: Right diaphragmatic hernia containing bowel with atelectasis in the lung bases. Pigtail drainage catheter demonstrated in the right upper quadrant, partially visualized. Electronically Signed   By: Burman NievesWilliam  Stevens M.D.   On: 04/01/2015 19:15   Dg Abd 1 View  04/01/2015  CLINICAL DATA:  Abscess EXAM: ABDOMEN - 1 VIEW COMPARISON:  03/20/2015 FINDINGS: Pigtail drain now projects over the right upper quadrant. There are no disproportionally dilated loops of bowel. There is no obvious free intraperitoneal gas. Levoscoliosis in lumbar spine with degenerative changes. IMPRESSION:  Nonobstructive bowel gas pattern. Right upper quadrant pigtail abscess drain placement. Electronically Signed   By: Jolaine ClickArthur  Hoss M.D.   On: 04/01/2015 19:15   I have personally reviewed and evaluated these images and lab results as part of my medical decision-making.   EKG Interpretation None      MDM   Final diagnoses:  Abscess of chest [facility](HCC)    Patient sent in for possibly displaced drain. Appears be grossly in place. White count is not elevating. No more vomiting on the ER. Will discharge home to follow-up as needed.    Benjiman CoreNathan Tawonna Esquer, MD 04/02/15 [phone]

## 2015-04-01 NOTE — ED Notes (Signed)
Pt states he is unable to provide urine and is now consenting to an in and out catheter

## 2015-04-01 NOTE — ED Notes (Signed)
[facility] called for transport.  

## 2015-04-01 NOTE — ED Notes (Signed)
Dr. Rubin PayorPickering at pt bedside

## 2015-04-01 NOTE — ED Notes (Addendum)
Per EMS, Pt from [facility]. Pt has hx of vascular dementia. Pt has a drain tube in an an abscess that is now clogged. Per facility, pt's white count was increased today. Pt A&O to baseline. Pt has hx of stroke and associated hospital with stroke so Pt thinks he is here for that reason.

## 2015-04-01 NOTE — ED Notes (Signed)
Pt refusing in and out catheter. Pt offered food and fluids.

## 2015-04-01 NOTE — ED Notes (Signed)
Bed: WA09 Expected date:  Expected time:  Means of arrival:  Comments: EMS 

## 2015-04-09 IMAGING — CR DG CHEST 1V PORT
1 series · 1 of 1 positions shown · non-contrast
Comparison: Chest x-ray 02/15/2010.

CLINICAL DATA: Cough and congestion.  Mid to upper abdominal pain.

EXAM:
PORTABLE CHEST - 1 VIEW

[AP]
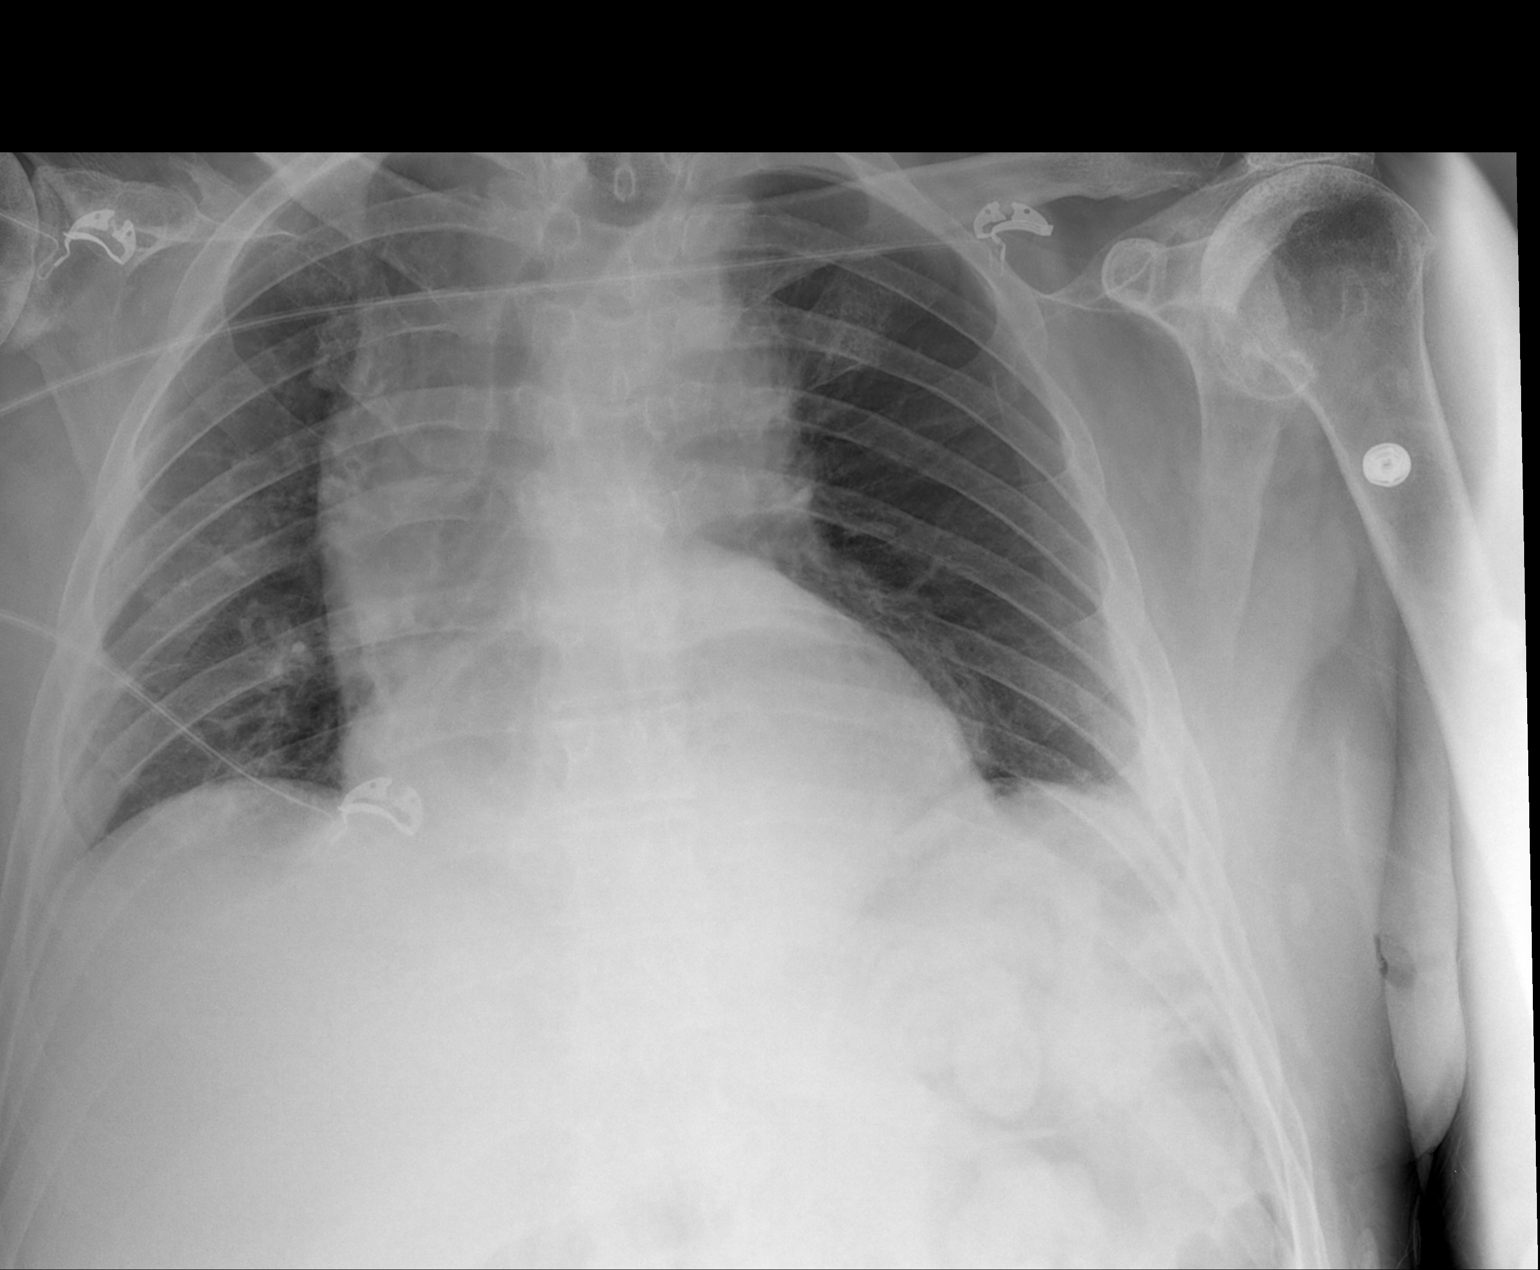

[1 of 1 positions shown; findings below may reference images not displayed]

FINDINGS: Lung volumes are low. No consolidative airspace disease. No pleural
effusions. No evidence of pulmonary edema. Large hiatal hernia.
Heart size is within normal limits. The patient is rotated to the
right on today's exam, resulting in distortion of the mediastinal
contours and reduced diagnostic sensitivity and specificity for
mediastinal pathology.
IMPRESSION: 1. No radiographic evidence of acute cardiopulmonary disease.

## 2015-04-10 ENCOUNTER — Ambulatory Visit
Admission: RE | Admit: 2015-04-10 | Discharge: 2015-04-10 | Disposition: A | Discharge status: Home / Self Care | Payer: Medicare Other | Source: Ambulatory Visit | Attending: Surgery | Admitting: Surgery

## 2015-04-10 ENCOUNTER — Ambulatory Visit
Admission: RE | Admit: 2015-04-10 | Discharge: 2015-04-10 | Disposition: A | Discharge status: Home / Self Care | Payer: Medicare Other | Source: Ambulatory Visit | Attending: Interventional Radiology | Admitting: Interventional Radiology

## 2015-04-10 ENCOUNTER — Other Ambulatory Visit: Payer: Self-pay | Admitting: Surgery

## 2015-04-10 ENCOUNTER — Other Ambulatory Visit (HOSPITAL_COMMUNITY): Payer: Self-pay | Admitting: Interventional Radiology

## 2015-04-10 DIAGNOSIS — K651 Peritoneal abscess: Secondary | ICD-10-CM

## 2015-04-10 IMAGING — XA IR CHOLECYSTOSTOMY
1 series · 3 of 3 positions shown · non-contrast
Comparison: CT ABD/PELVIS W CM dated 08/04/2013;

CLINICAL DATA: Cholelithiasis, acute cholecystitis and sepsis. The
patient is not a candidate for cholecystectomy currently and
requires placement of a percutaneous cholecystostomy tube.

EXAM:
PERCUTANEOUS CHOECYSTOSTOMY

[Series 300: sp perc cholecystostomy · 3 of 3 slices shown]
[im 1/3]
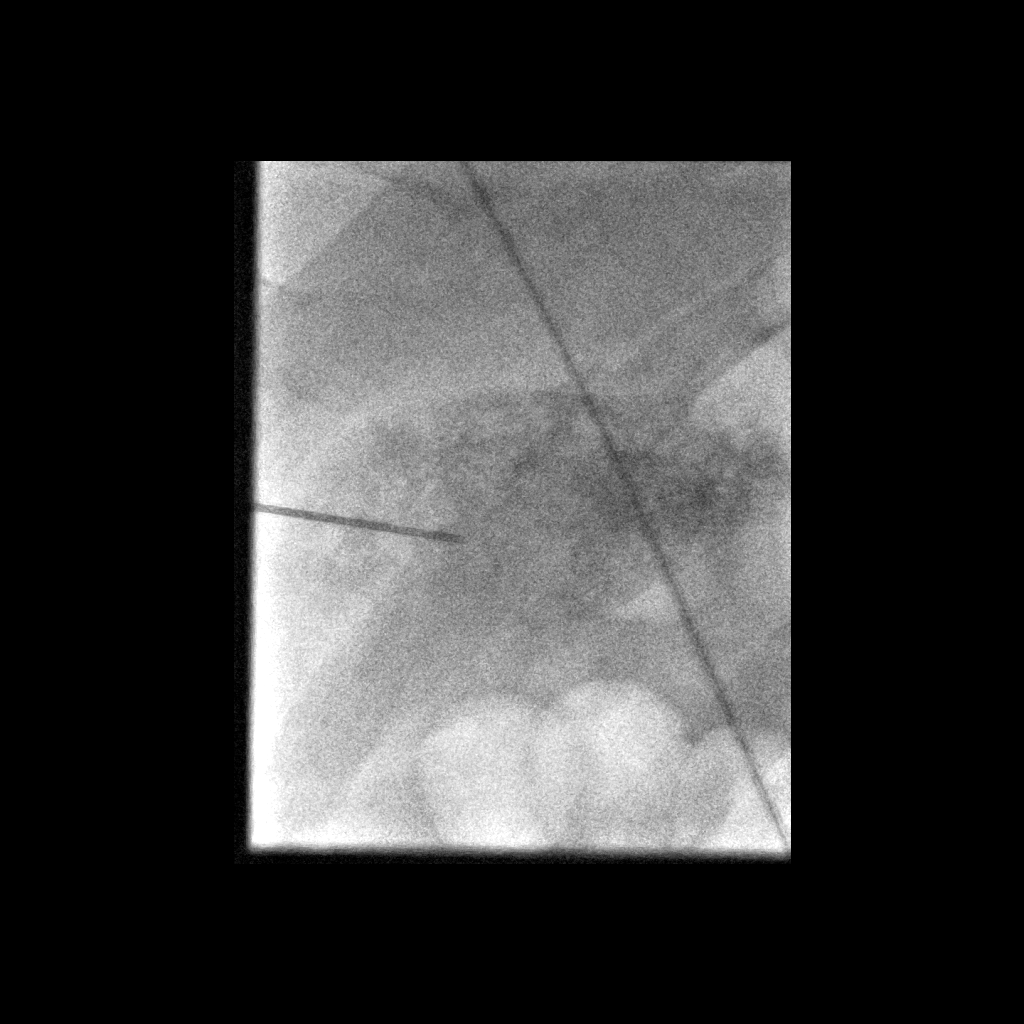
[im 2/3]
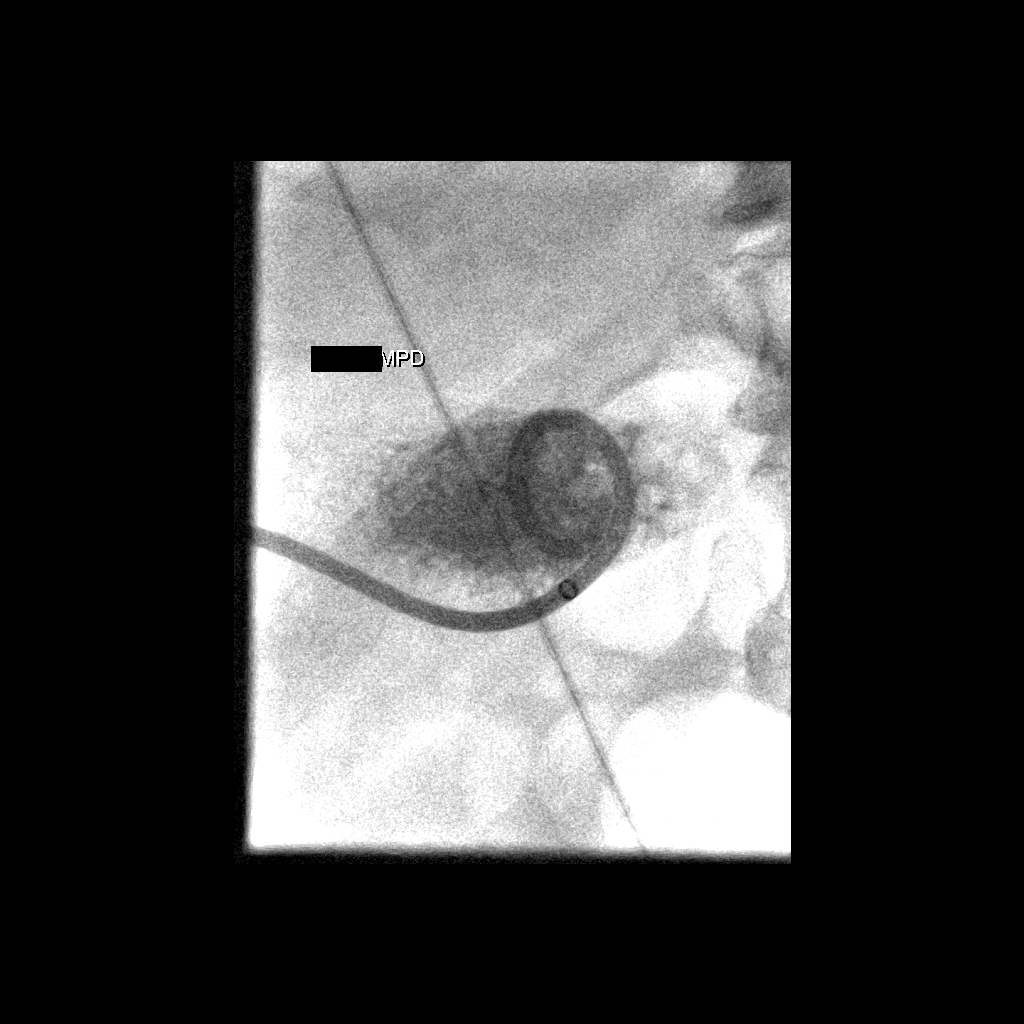
[im 3/3]
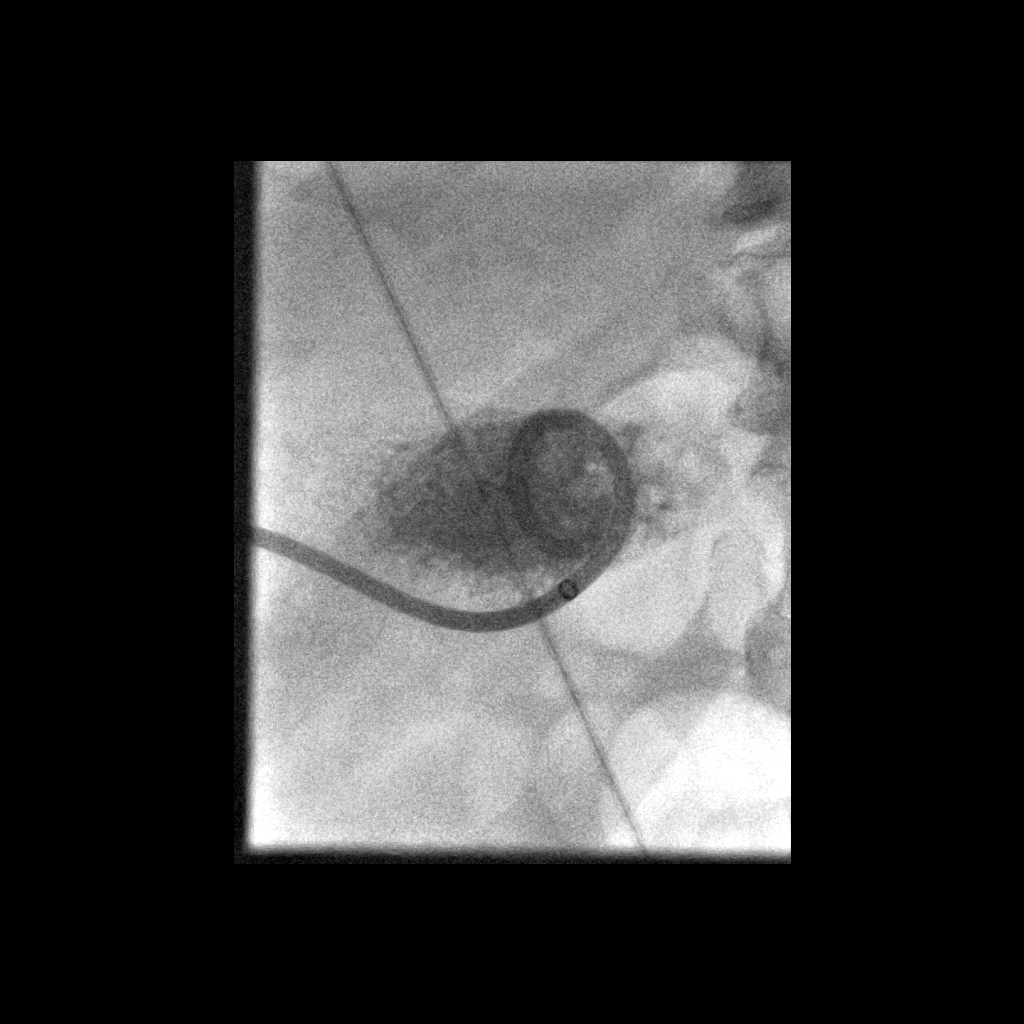

[3 of 3 positions shown; findings below may reference images not displayed]

CT ABD/PELV WO CM
dated 08/03/2013; US ABDOMEN LIMITED RUQ/ASCITES dated 08/03/2013

ANESTHESIA/SEDATION:
No conscious sedation was administered.

CONTRAST:  5 mL Omnipaque 300

MEDICATIONS:
No additional medications.

FLUOROSCOPY TIME:  12 seconds.

PROCEDURE:
The procedure, risks, benefits, and alternatives were explained to
the patient. Questions regarding the procedure were encouraged and
answered. The patient understands and consents to the procedure.

The right abdominal wall was prepped with Betadine in a sterile
fashion, and a sterile drape was applied covering the operative
field. A sterile gown and sterile gloves were used for the
procedure. Local anesthesia was provided with 1% Lidocaine.
Ultrasound image documentation was performed. Fluoroscopy during the
procedure and fluoro spot radiograph confirms appropriate catheter
position.

Ultrasound was utilized to localize the gallbladder. Under direct
ultrasound guidance, an 18 gauge needle was advanced via a
transhepatic approach into the gallbladder lumen. Aspiration was
performed and a bile sample sent for culture studies. Antanas Ir Vaida Eeraerts amount
of diluted contrast material was injected. A guide wire was then
advanced into the gallbladder. A transitional dilator was placed.

Percutaneous tract dilatation was then performed over a guide wire
to 10-French. A 10-French pigtail drainage catheter was then
advanced into the gallbladder lumen under fluoroscopy. Catheter was
formed and injected with contrast material to confirm position. The
catheter was flushed and connected to a gravity drainage bag. It was
secured at the skin with a Prolene retention suture and Stat-Lock
device.

COMPLICATIONS:
None
FINDINGS: After needle puncture of the gallbladder, a bile sample was
aspirated and sent for culture. The cholecystostomy tube was
advanced into the gallbladder lumen and formed. It is now draining
bile. This tube will be left to gravity drainage.
IMPRESSION: Percutaneous cholecystostomy with placement of 10-French drainage
catheter into the gallbladder lumen. This was left to gravity
drainage.

## 2015-04-10 MED ORDER — IOPAMIDOL (ISOVUE-300) INJECTION 61%
100.0000 mL | Freq: Once | INTRAVENOUS | Status: AC | PRN
  Administered 2015-04-10: 100 mL via INTRAVENOUS

## 2015-04-10 NOTE — Progress Notes (Signed)
Referring Physician(s): Hassell,Daniel  Chief Complaint: Drain follow up  Subjective: [name] is a pleasant 79 y/o male with RUQ perihepatic abscess drain, placed 11/10. He is here for follow up CT and eval today. Doing well, tolerating diet, minimal to no pain, no fevers. Per wife, drain is being flushed with 5cc NS TID at nursing facility. Still with cloudy output. Pt feels ok otherwise   Allergies: Ace inhibitors and Penicillins  Medications: Prior to Admission medications   Medication Sig Start Date End Date Taking? Authorizing Provider  albuterol (PROAIR HFA) 108 (90 BASE) MCG/ACT inhaler Inhale 2 puffs into the lungs every 3 (three) hours as needed for wheezing or shortness of breath.    Historical Provider, MD  ascorbic acid (VITAMIN C) 1000 MG tablet Take 1,000 mg by mouth daily.    Historical Provider, MD  aspirin 325 MG tablet Take 325 mg by mouth daily.    Historical Provider, MD  baclofen (LIORESAL) 20 MG tablet Take 20 mg by mouth every evening.    Historical Provider, MD  cholecalciferol (VITAMIN D) 1000 UNITS tablet Take 1,000 Units by mouth daily.      Historical Provider, MD  clonazePAM (KLONOPIN) 0.5 MG tablet Take 1 tablet (0.5 mg total) by mouth 2 (two) times daily as needed (anxiety). 08/09/13   Alysia Penna, MD  cyanocobalamin 500 MCG tablet Take 500 mcg by mouth daily.    Historical Provider, MD  Dextromethorphan-Quinidine (NUEDEXTA) 20-10 MG CAPS Take 1 capsule by mouth daily.    Historical Provider, MD  docusate sodium (COLACE) 100 MG capsule Take 100 mg by mouth every evening.    Historical Provider, MD  doxycycline (ADOXA) 100 MG tablet Take 100 mg by mouth 2 (two) times daily.    Historical Provider, MD  doxycycline (VIBRAMYCIN) 100 MG capsule Take 1 capsule (100 mg total) by mouth 2 (two) times daily. For 14days, may need longer course depending on Repeat Imaging Patient not taking: Reported on 04/01/2015 02/19/15   Zannie Cove, MD  ferrous  sulfate 325 (65 FE) MG tablet Take 325 mg by mouth 2 (two) times daily.    Historical Provider, MD  finasteride (PROSCAR) 5 MG tablet Take 5 mg by mouth daily.    Historical Provider, MD  Hypromellose (GENTEAL) 0.3 % SOLN Place 1 drop into both eyes 3 (three) times daily as needed (dry eyes).    Historical Provider, MD  Liniments Chinita Pester) PADS Apply 1 each topically daily as needed (pain). Apply to left knee    Historical Provider, MD  metoprolol succinate (TOPROL-XL) 50 MG 24 hr tablet Take 50 mg by mouth daily. Take with or immediately following a meal.    Historical Provider, MD  mirtazapine (REMERON) 7.5 MG tablet Take 7.5 mg by mouth at bedtime.    Historical Provider, MD  Multiple Vitamin (MULITIVITAMIN WITH MINERALS) TABS Take 1 tablet by mouth daily.      Historical Provider, MD  polyethylene glycol (MIRALAX / GLYCOLAX) packet Take 17 g by mouth daily.    Historical Provider, MD  QUEtiapine (SEROQUEL) 25 MG tablet Take 25 mg by mouth at bedtime.    Historical Provider, MD  saccharomyces boulardii (FLORASTOR) 250 MG capsule Take 250 mg by mouth 2 (two) times daily.    Historical Provider, MD     Vital Signs: BP 126/66 mmHg  Pulse 74  SpO2 99%  Physical Exam  Constitutional: He appears well-developed.  Abdominal: Soft. He exhibits no distension. There is no tenderness.  RUQ  drain intact, site clean. No evidence of drain retraction or kinking Drain flushed easily with 5cc sterile saline, return of cloudy fluid.  Skin: He is not diaphoretic.    Assessment and Plan: RUQ perihepatic abscess s/p drain CT today shows improvement but not resolution of fluid collection. Drain is in good position. Drain needs to remain and continue at least BID/TID flushes. Pt is to see Dr. Magnus IvanBlackman Friday. Recommend repeat CT in another 2-3 weeks  Signed: Brayton ElBRUNING, Alice Burnside 04/10/2015, 10:41 AM

## 2015-04-11 IMAGING — CR DG CHEST 1V PORT
1 series · 1 of 1 positions shown · non-contrast
Comparison: 08/04/2013

CLINICAL DATA: Wheezing.  Questionable aspiration.

EXAM:
PORTABLE CHEST - 1 VIEW

[AP]
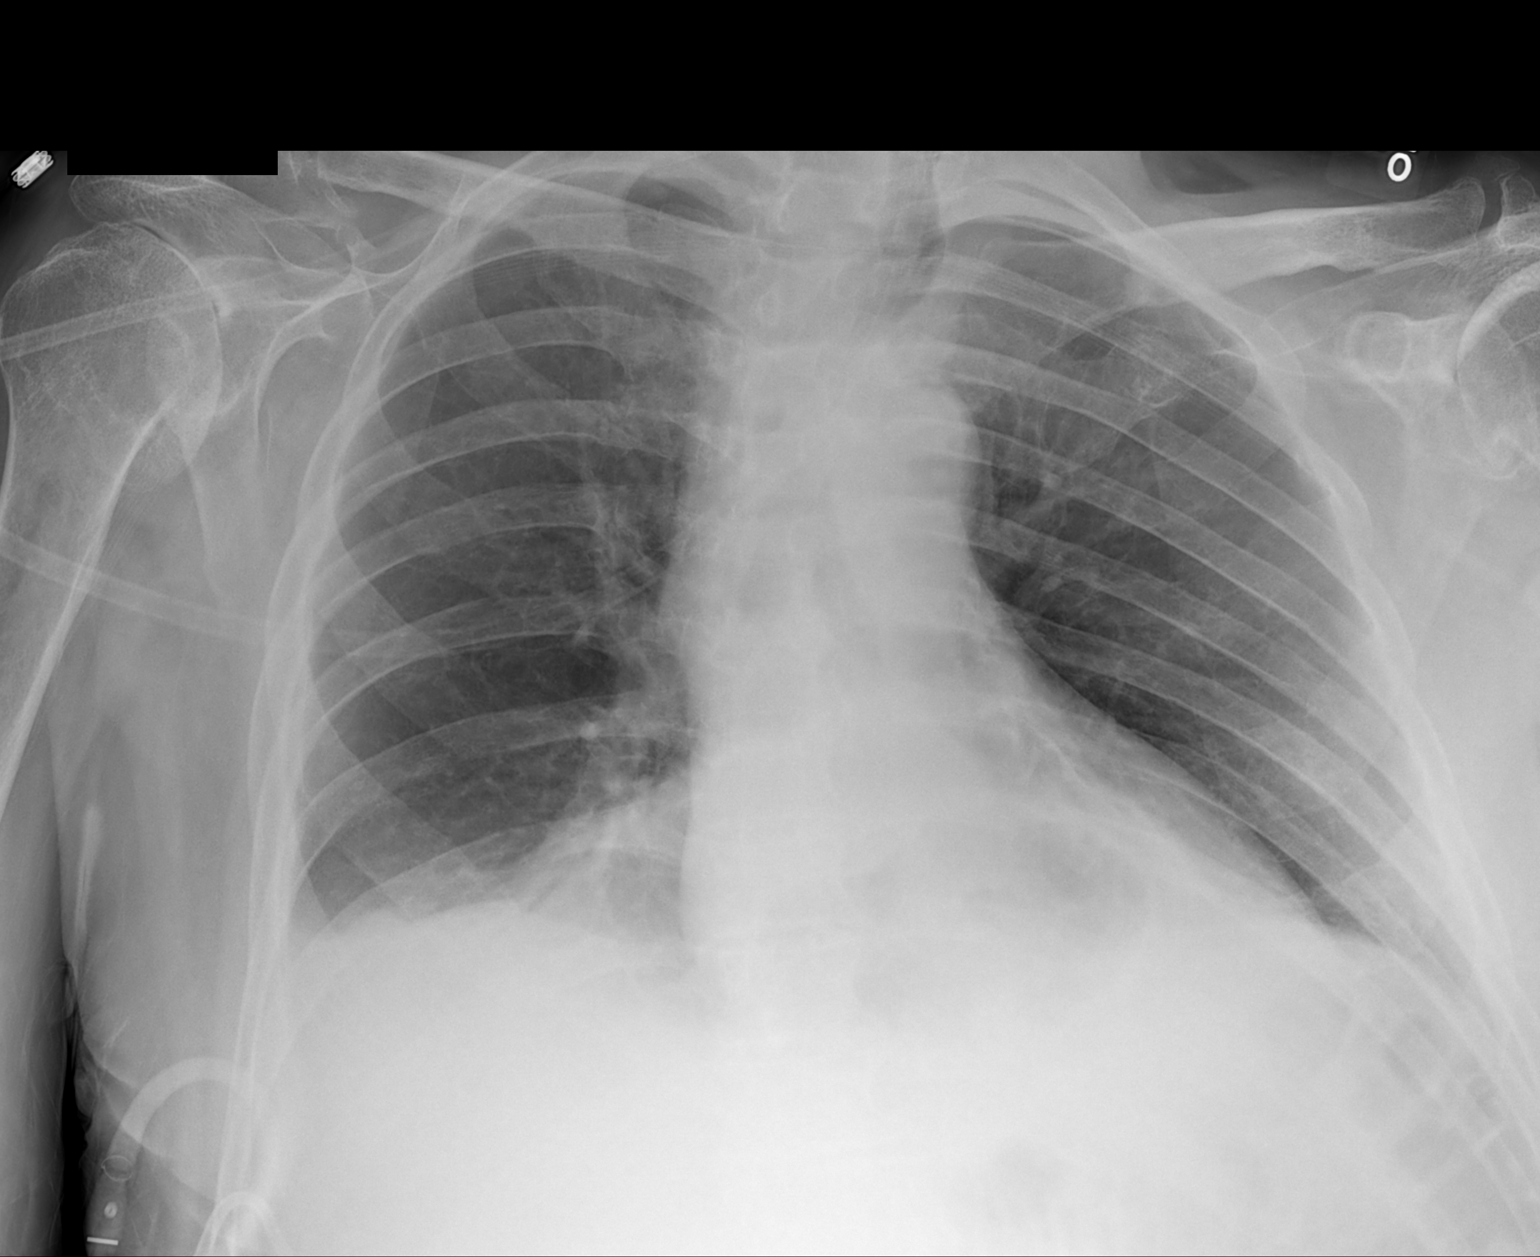

[1 of 1 positions shown; findings below may reference images not displayed]

FINDINGS: There is new right lung base opacity. This partly obscures the right
hemidiaphragm. This may reflect aspiration pneumonitis. It may all
be atelectasis. There is mild left medial lung base atelectasis.

Cardiac silhouette is normal in size. Moderate to large hiatal
hernia. No other mediastinal masses.

No pulmonary edema.  No pneumothorax.
IMPRESSION: 1. New right lung base opacity. This may all be atelectasis, but is
new from the prior study. This could reflect that aspiration
pneumonitis.

## 2015-04-24 ENCOUNTER — Ambulatory Visit
Admission: RE | Admit: 2015-04-24 | Discharge: 2015-04-24 | Disposition: A | Discharge status: Home / Self Care | Payer: Medicare Other | Source: Ambulatory Visit | Attending: Interventional Radiology | Admitting: Interventional Radiology

## 2015-04-24 ENCOUNTER — Other Ambulatory Visit: Payer: Self-pay | Admitting: Radiology

## 2015-04-24 ENCOUNTER — Ambulatory Visit
Admission: RE | Admit: 2015-04-24 | Discharge: 2015-04-24 | Disposition: A | Discharge status: Home / Self Care | Payer: Medicare Other | Source: Ambulatory Visit | Attending: Surgery | Admitting: Surgery

## 2015-04-24 DIAGNOSIS — K650 Generalized (acute) peritonitis: Secondary | ICD-10-CM

## 2015-04-24 DIAGNOSIS — K651 Peritoneal abscess: Secondary | ICD-10-CM

## 2015-04-24 MED ORDER — IOPAMIDOL (ISOVUE-300) INJECTION 61%
100.0000 mL | Freq: Once | INTRAVENOUS | Status: AC | PRN
  Administered 2015-04-24: 100 mL via INTRAVENOUS
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
## 2015-04-26 ENCOUNTER — Other Ambulatory Visit: Payer: Self-pay | Admitting: Radiology

## 2015-04-26 ENCOUNTER — Ambulatory Visit (HOSPITAL_COMMUNITY)
Admission: RE | Admit: 2015-04-26 | Discharge: 2015-04-26 | Disposition: A | Discharge status: Home / Self Care | Payer: Medicare Other | Source: Ambulatory Visit | Attending: Radiology | Admitting: Radiology

## 2015-04-26 DIAGNOSIS — K750 Abscess of liver: Secondary | ICD-10-CM | POA: Diagnosis not present

## 2015-04-26 DIAGNOSIS — K650 Generalized (acute) peritonitis: Secondary | ICD-10-CM

## 2015-04-26 DIAGNOSIS — Z4803 Encounter for change or removal of drains: Secondary | ICD-10-CM | POA: Diagnosis present

## 2015-04-26 MED ORDER — IOHEXOL 300 MG/ML  SOLN
50.0000 mL | Freq: Once | INTRAMUSCULAR | Status: AC | PRN
  Administered 2015-04-26: 10 mL via INTRAVENOUS

## 2015-04-26 MED ORDER — LIDOCAINE HCL 1 % IJ SOLN
INTRAMUSCULAR | Status: AC
  Filled 2015-04-26: qty 20

## 2015-04-26 NOTE — Procedures (Signed)
RUQ drain exchange 12 fr No comp/EBL

## 2015-04-29 ENCOUNTER — Other Ambulatory Visit (HOSPITAL_COMMUNITY): Payer: Medicare Other | Admitting: Interventional Radiology

## 2015-04-29 ENCOUNTER — Other Ambulatory Visit: Payer: Self-pay | Admitting: Surgery

## 2015-04-29 ENCOUNTER — Other Ambulatory Visit: Payer: Self-pay | Admitting: *Deleted

## 2015-04-29 DIAGNOSIS — K651 Peritoneal abscess: Secondary | ICD-10-CM

## 2015-04-29 DIAGNOSIS — K650 Generalized (acute) peritonitis: Secondary | ICD-10-CM

## 2015-05-16 ENCOUNTER — Other Ambulatory Visit: Payer: Medicare Other

## 2015-05-16 ENCOUNTER — Other Ambulatory Visit: Payer: Self-pay | Admitting: Internal Medicine

## 2015-05-16 DIAGNOSIS — R188 Other ascites: Secondary | ICD-10-CM

## 2015-05-17 ENCOUNTER — Other Ambulatory Visit: Payer: Self-pay | Admitting: Internal Medicine

## 2015-05-17 ENCOUNTER — Ambulatory Visit (HOSPITAL_COMMUNITY)
Admission: RE | Admit: 2015-05-17 | Discharge: 2015-05-17 | Disposition: A | Discharge status: Home / Self Care | Payer: Medicare Other | Source: Ambulatory Visit | Attending: Radiology | Admitting: Radiology

## 2015-05-17 ENCOUNTER — Ambulatory Visit (HOSPITAL_COMMUNITY)
Admission: RE | Admit: 2015-05-17 | Discharge: 2015-05-17 | Disposition: A | Discharge status: Home / Self Care | Payer: Medicare Other | Source: Ambulatory Visit | Attending: Internal Medicine | Admitting: Internal Medicine

## 2015-05-17 DIAGNOSIS — R42 Dizziness and giddiness: Secondary | ICD-10-CM | POA: Diagnosis not present

## 2015-05-17 DIAGNOSIS — K651 Peritoneal abscess: Secondary | ICD-10-CM | POA: Insufficient documentation

## 2015-05-17 DIAGNOSIS — I10 Essential (primary) hypertension: Secondary | ICD-10-CM | POA: Insufficient documentation

## 2015-05-17 DIAGNOSIS — E7800 Pure hypercholesterolemia, unspecified: Secondary | ICD-10-CM | POA: Insufficient documentation

## 2015-05-17 DIAGNOSIS — Z880 Allergy status to penicillin: Secondary | ICD-10-CM | POA: Insufficient documentation

## 2015-05-17 DIAGNOSIS — M1990 Unspecified osteoarthritis, unspecified site: Secondary | ICD-10-CM | POA: Insufficient documentation

## 2015-05-17 DIAGNOSIS — Z4803 Encounter for change or removal of drains: Secondary | ICD-10-CM | POA: Insufficient documentation

## 2015-05-17 DIAGNOSIS — K750 Abscess of liver: Secondary | ICD-10-CM | POA: Insufficient documentation

## 2015-05-17 DIAGNOSIS — G8190 Hemiplegia, unspecified affecting unspecified side: Secondary | ICD-10-CM | POA: Insufficient documentation

## 2015-05-17 DIAGNOSIS — B9562 Methicillin resistant Staphylococcus aureus infection as the cause of diseases classified elsewhere: Secondary | ICD-10-CM | POA: Diagnosis not present

## 2015-05-17 DIAGNOSIS — F0390 Unspecified dementia without behavioral disturbance: Secondary | ICD-10-CM | POA: Diagnosis not present

## 2015-05-17 DIAGNOSIS — R188 Other ascites: Secondary | ICD-10-CM

## 2015-05-17 DIAGNOSIS — Z7982 Long term (current) use of aspirin: Secondary | ICD-10-CM | POA: Insufficient documentation

## 2015-05-17 DIAGNOSIS — G8929 Other chronic pain: Secondary | ICD-10-CM | POA: Insufficient documentation

## 2015-05-17 DIAGNOSIS — G250 Essential tremor: Secondary | ICD-10-CM | POA: Diagnosis not present

## 2015-05-17 DIAGNOSIS — Z87820 Personal history of traumatic brain injury: Secondary | ICD-10-CM | POA: Insufficient documentation

## 2015-05-17 DIAGNOSIS — Z87891 Personal history of nicotine dependence: Secondary | ICD-10-CM | POA: Insufficient documentation

## 2015-05-17 DIAGNOSIS — Z8673 Personal history of transient ischemic attack (TIA), and cerebral infarction without residual deficits: Secondary | ICD-10-CM | POA: Diagnosis not present

## 2015-05-17 DIAGNOSIS — Z9049 Acquired absence of other specified parts of digestive tract: Secondary | ICD-10-CM | POA: Insufficient documentation

## 2015-05-17 DIAGNOSIS — K650 Generalized (acute) peritonitis: Secondary | ICD-10-CM
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
## 2015-05-21 ENCOUNTER — Other Ambulatory Visit: Payer: Medicare Other

## 2015-05-21 ENCOUNTER — Inpatient Hospital Stay: Admission: RE | Admit: 2015-05-21 | Payer: Medicare Other | Source: Ambulatory Visit

## 2015-06-04 ENCOUNTER — Other Ambulatory Visit: Payer: Medicare Other

## 2016-04-15 NOTE — Addendum Note (Signed)
Encounter addended by: Herbie BaltimoreMelinda J Adriel Kessen on: 04/15/2016  3:34 PM<BR>    Actions taken: Imaging Exam ended

## 2016-07-17 ENCOUNTER — Encounter (HOSPITAL_BASED_OUTPATIENT_CLINIC_OR_DEPARTMENT_OTHER): Payer: Medicare Other | Attending: Internal Medicine

## 2016-07-17 DIAGNOSIS — Z9049 Acquired absence of other specified parts of digestive tract: Secondary | ICD-10-CM | POA: Insufficient documentation

## 2016-07-17 DIAGNOSIS — F0390 Unspecified dementia without behavioral disturbance: Secondary | ICD-10-CM | POA: Insufficient documentation

## 2016-07-17 DIAGNOSIS — G8191 Hemiplegia, unspecified affecting right dominant side: Secondary | ICD-10-CM | POA: Insufficient documentation

## 2016-07-17 DIAGNOSIS — Y838 Other surgical procedures as the cause of abnormal reaction of the patient, or of later complication, without mention of misadventure at the time of the procedure: Secondary | ICD-10-CM | POA: Insufficient documentation

## 2016-07-17 DIAGNOSIS — Z87891 Personal history of nicotine dependence: Secondary | ICD-10-CM | POA: Insufficient documentation

## 2016-07-17 DIAGNOSIS — T8189XA Other complications of procedures, not elsewhere classified, initial encounter: Secondary | ICD-10-CM | POA: Insufficient documentation

## 2016-07-17 DIAGNOSIS — I10 Essential (primary) hypertension: Secondary | ICD-10-CM | POA: Insufficient documentation

## 2016-07-17 DIAGNOSIS — Z87820 Personal history of traumatic brain injury: Secondary | ICD-10-CM | POA: Insufficient documentation

## 2016-07-17 DIAGNOSIS — Z96659 Presence of unspecified artificial knee joint: Secondary | ICD-10-CM | POA: Insufficient documentation

## 2016-07-28 DIAGNOSIS — F0390 Unspecified dementia without behavioral disturbance: Secondary | ICD-10-CM | POA: Diagnosis not present

## 2016-07-28 DIAGNOSIS — Z96659 Presence of unspecified artificial knee joint: Secondary | ICD-10-CM | POA: Diagnosis not present

## 2016-07-28 DIAGNOSIS — I10 Essential (primary) hypertension: Secondary | ICD-10-CM | POA: Diagnosis not present

## 2016-07-28 DIAGNOSIS — T8189XA Other complications of procedures, not elsewhere classified, initial encounter: Secondary | ICD-10-CM | POA: Diagnosis not present

## 2016-07-28 DIAGNOSIS — Z87820 Personal history of traumatic brain injury: Secondary | ICD-10-CM | POA: Diagnosis not present

## 2016-07-28 DIAGNOSIS — Z9049 Acquired absence of other specified parts of digestive tract: Secondary | ICD-10-CM | POA: Diagnosis not present

## 2016-07-28 DIAGNOSIS — Y838 Other surgical procedures as the cause of abnormal reaction of the patient, or of later complication, without mention of misadventure at the time of the procedure: Secondary | ICD-10-CM | POA: Diagnosis not present

## 2016-07-28 DIAGNOSIS — Z87891 Personal history of nicotine dependence: Secondary | ICD-10-CM | POA: Diagnosis not present

## 2016-07-28 DIAGNOSIS — G8191 Hemiplegia, unspecified affecting right dominant side: Secondary | ICD-10-CM | POA: Diagnosis not present

## 2016-08-04 DIAGNOSIS — T8189XA Other complications of procedures, not elsewhere classified, initial encounter: Secondary | ICD-10-CM | POA: Diagnosis not present

## 2016-08-26 ENCOUNTER — Other Ambulatory Visit: Payer: Self-pay | Admitting: Surgery

## 2016-08-26 DIAGNOSIS — L02213 Cutaneous abscess of chest wall: Secondary | ICD-10-CM

## 2016-09-02 ENCOUNTER — Ambulatory Visit
Admission: RE | Admit: 2016-09-02 | Discharge: 2016-09-02 | Disposition: A | Discharge status: Home / Self Care | Payer: Medicare Other | Source: Ambulatory Visit | Attending: Surgery | Admitting: Surgery

## 2016-09-02 DIAGNOSIS — L02213 Cutaneous abscess of chest wall: Secondary | ICD-10-CM

## 2016-09-02 MED ORDER — IOPAMIDOL (ISOVUE-300) INJECTION 61%
75.0000 mL | Freq: Once | INTRAVENOUS | Status: AC | PRN
  Administered 2016-09-02: 75 mL via INTRAVENOUS

## 2016-09-11 ENCOUNTER — Other Ambulatory Visit (HOSPITAL_COMMUNITY): Payer: Self-pay | Admitting: Surgery

## 2016-09-11 DIAGNOSIS — L02213 Cutaneous abscess of chest wall: Secondary | ICD-10-CM

## 2016-09-11 DIAGNOSIS — K651 Peritoneal abscess: Secondary | ICD-10-CM

## 2016-09-18 ENCOUNTER — Other Ambulatory Visit: Payer: Self-pay | Admitting: Radiology

## 2016-09-21 ENCOUNTER — Other Ambulatory Visit: Payer: Self-pay | Admitting: Student

## 2016-09-21 ENCOUNTER — Other Ambulatory Visit: Payer: Self-pay | Admitting: Radiology

## 2016-09-22 ENCOUNTER — Encounter (HOSPITAL_COMMUNITY): Payer: Self-pay

## 2016-09-22 ENCOUNTER — Other Ambulatory Visit (HOSPITAL_COMMUNITY): Payer: Self-pay | Admitting: Surgery

## 2016-09-22 ENCOUNTER — Ambulatory Visit (HOSPITAL_COMMUNITY)
Admission: RE | Admit: 2016-09-22 | Discharge: 2016-09-22 | Disposition: A | Discharge status: Home / Self Care | Payer: Medicare Other | Source: Ambulatory Visit | Attending: Surgery | Admitting: Surgery

## 2016-09-22 DIAGNOSIS — L02213 Cutaneous abscess of chest wall: Secondary | ICD-10-CM

## 2016-09-22 DIAGNOSIS — K750 Abscess of liver: Secondary | ICD-10-CM | POA: Diagnosis not present

## 2016-09-22 DIAGNOSIS — K651 Peritoneal abscess: Secondary | ICD-10-CM

## 2016-09-22 LAB — CBC WITH DIFFERENTIAL/PLATELET
Basophils Absolute: 0 10*3/uL (ref 0.0–0.1)
Basophils Relative: 0 %
Eosinophils Absolute: 1 10*3/uL — ABNORMAL HIGH (ref 0.0–0.7)
Eosinophils Relative: 12 %
HEMATOCRIT: 38.9 % — AB (ref 39.0–52.0)
HEMOGLOBIN: 12 g/dL — AB (ref 13.0–17.0)
LYMPHS ABS: 1.3 10*3/uL (ref 0.7–4.0)
LYMPHS PCT: 15 %
MCH: 25.1 pg — AB (ref 26.0–34.0)
MCHC: 30.8 g/dL (ref 30.0–36.0)
MCV: 81.2 fL (ref 78.0–100.0)
MONO ABS: 0.5 10*3/uL (ref 0.1–1.0)
MONOS PCT: 6 %
NEUTROS ABS: 5.8 10*3/uL (ref 1.7–7.7)
Neutrophils Relative %: 67 %
Platelets: 327 10*3/uL (ref 150–400)
RBC: 4.79 MIL/uL (ref 4.22–5.81)
RDW: 16.9 % — AB (ref 11.5–15.5)
WBC: 8.6 10*3/uL (ref 4.0–10.5)

## 2016-09-22 LAB — BASIC METABOLIC PANEL
Anion gap: 6 (ref 5–15)
BUN: 14 mg/dL (ref 6–20)
CALCIUM: 8.7 mg/dL — AB (ref 8.9–10.3)
CHLORIDE: 104 mmol/L (ref 101–111)
CO2: 28 mmol/L (ref 22–32)
CREATININE: 0.91 mg/dL (ref 0.61–1.24)
GFR calc non Af Amer: >60 mL/min (ref >60)
GLUCOSE: 92 mg/dL (ref 65–99)
Potassium: 4.9 mmol/L (ref 3.5–5.1)
Sodium: 138 mmol/L (ref 135–145)

## 2016-09-22 LAB — PROTIME-INR
INR: 1.13
Prothrombin Time: 14.6 seconds (ref 11.4–15.2)

## 2016-09-22 MED ORDER — SODIUM CHLORIDE 0.9 % IV SOLN
INTRAVENOUS | Status: DC
  Administered 2016-09-22: 50 mL/h via INTRAVENOUS

## 2016-09-22 MED ORDER — LIDOCAINE HCL 1 % IJ SOLN
INTRAMUSCULAR | Status: AC
  Filled 2016-09-22: qty 20

## 2016-09-22 MED ORDER — FENTANYL CITRATE (PF) 100 MCG/2ML IJ SOLN
INTRAMUSCULAR | Status: AC
  Filled 2016-09-22: qty 6

## 2016-09-22 MED ORDER — MIDAZOLAM HCL 2 MG/2ML IJ SOLN
INTRAMUSCULAR | Status: AC | PRN
  Administered 2016-09-22: 1 mg via INTRAVENOUS

## 2016-09-22 MED ORDER — MIDAZOLAM HCL 2 MG/2ML IJ SOLN
INTRAMUSCULAR | Status: AC
  Filled 2016-09-22: qty 6

## 2016-09-22 MED ORDER — FENTANYL CITRATE (PF) 100 MCG/2ML IJ SOLN
INTRAMUSCULAR | Status: AC | PRN
  Administered 2016-09-22: 50 ug via INTRAVENOUS

## 2016-09-22 NOTE — Sedation Documentation (Signed)
Patient is resting comfortably. 

## 2016-09-22 NOTE — Sedation Documentation (Signed)
Patient denies pain and is resting comfortably.  

## 2016-09-22 NOTE — Progress Notes (Signed)
Attempted to call [facility] to review discharge instructions.  Unable to get anyone to answer the phone.  Instructions reviewed with wife and copies sent with her and with patient to return to [facility].  Wife states that patient has had a JP drain previously that the nursing home hs managed and that they are familiar with care required.

## 2016-09-22 NOTE — Procedures (Signed)
Interventional Radiology Procedure Note  Procedure: CT guided drainage of perihepatic abscess  Complications: None  Estimated Blood Loss: < 10 mL  Fistulous tract opening probed with 5 Fr dilator and guidewire advanced. 12 Fr drain advanced into recurrent perihepatic abscess.  Attached to suction bulb.  Jodi MarbleGlenn T. Fredia SorrowYamagata, M.D Pager:  [phone]
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
## 2016-09-22 NOTE — Discharge Instructions (Signed)
Percutaneous Abscess Drain, Care After °This sheet gives you information about how to care for yourself after your procedure. Your health care provider may also give you more specific instructions. If you have problems or questions, contact your health care provider. °What can I expect after the procedure? °After your procedure, it is common to have: °· A Budney amount of bruising and discomfort in the area where the drainage tube (catheter) was placed. °· Sleepiness and fatigue. This should go away after the medicines you were given have worn off. ° °Follow these instructions at home: °Incision care °· Follow instructions from your health care provider about how to take care of your incision. Make sure you: °? Wash your hands with soap and water before you change your bandage (dressing). If soap and water are not available, use hand sanitizer. °? Change your dressing as told by your health care provider. °? Leave stitches (sutures), skin glue, or adhesive strips in place. These skin closures may need to stay in place for 2 weeks or longer. If adhesive strip edges start to loosen and curl up, you may trim the loose edges. Do not remove adhesive strips completely unless your health care provider tells you to do that. °· Check your incision area every day for signs of infection. Check for: °? More redness, swelling, or pain. °? More fluid or blood. °? Warmth. °? Pus or a bad smell. °? Fluid leaking from around your catheter (instead of fluid draining through your catheter). °Catheter care °· Follow instructions from your health care provider about emptying and cleaning your catheter and collection bag. You may need to clean the catheter every day so it does not clog. °· If directed, write down the following information every time you empty your bag: °? The date and time. °? The amount of drainage. °General instructions °· Rest at home for 1-2 days after your procedure. Return to your normal activities as told by your  health care provider. °· Do not take baths, swim, or use a hot tub for 24 hours after your procedure, or until your health care provider says that this is okay. °· Take over-the-counter and prescription medicines only as told by your health care provider. °· Keep all follow-up visits as told by your health care provider. This is important. °Contact a health care provider if: °· You have less than 10 mL of drainage a day for 2-3 days in a row, or as directed by your health care provider. °· You have more redness, swelling, or pain around your incision area. °· You have more fluid or blood coming from your incision area. °· Your incision area feels warm to the touch. °· You have pus or a bad smell coming from your incision area. °· You have fluid leaking from around your catheter (instead of through your catheter). °· You have a fever or chills. °· You have pain that does not get better with medicine. °Get help right away if: °· Your catheter comes out. °· You suddenly stop having drainage from your catheter. °· You suddenly have blood in the fluid that is draining from your catheter. °· You become dizzy or you faint. °· You develop a rash. °· You have nausea or vomiting. °· You have difficulty breathing or you feel short of breath. °· You develop chest pain. °· You have problems with your speech or vision. °· You have trouble balancing or moving your arms or legs. °Summary °· It is common to have a [place]   amount of bruising and discomfort in the area where the drainage tube (catheter) was placed. °· You may be directed to record the amount of drainage from the bag every time you empty it. °· Follow instructions from your health care provider about emptying and cleaning your catheter and collection bag. °This information is not intended to replace advice given to you by your health care provider. Make sure you discuss any questions you have with your health care provider. °Document Released: 09/11/2013 Document  Revised: 03/19/2016 Document Reviewed: 03/19/2016 °Elsevier Interactive Patient Education © 2017 [employer] ° °

## 2016-09-27 LAB — AEROBIC/ANAEROBIC CULTURE (SURGICAL/DEEP WOUND)

## 2016-09-27 LAB — AEROBIC/ANAEROBIC CULTURE W GRAM STAIN (SURGICAL/DEEP WOUND)

## 2016-10-26 ENCOUNTER — Other Ambulatory Visit: Payer: Self-pay | Admitting: Surgery

## 2016-10-26 DIAGNOSIS — L02213 Cutaneous abscess of chest wall: Secondary | ICD-10-CM

## 2016-10-28 ENCOUNTER — Ambulatory Visit
Admission: RE | Admit: 2016-10-28 | Discharge: 2016-10-28 | Disposition: A | Discharge status: Home / Self Care | Payer: Medicare Other | Source: Ambulatory Visit | Attending: Surgery | Admitting: Surgery

## 2016-10-28 DIAGNOSIS — L02213 Cutaneous abscess of chest wall: Secondary | ICD-10-CM

## 2016-10-28 MED ORDER — IOPAMIDOL (ISOVUE-300) INJECTION 61%
75.0000 mL | Freq: Once | INTRAVENOUS | Status: AC | PRN
  Administered 2016-10-28: 75 mL via INTRAVENOUS
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
## 2016-11-09 ENCOUNTER — Other Ambulatory Visit (HOSPITAL_COMMUNITY): Payer: Self-pay | Admitting: Interventional Radiology

## 2016-11-09 ENCOUNTER — Other Ambulatory Visit: Payer: Self-pay | Admitting: Radiology

## 2016-11-09 DIAGNOSIS — K650 Generalized (acute) peritonitis: Secondary | ICD-10-CM

## 2016-11-10 ENCOUNTER — Ambulatory Visit (HOSPITAL_COMMUNITY)
Admission: RE | Admit: 2016-11-10 | Discharge: 2016-11-10 | Disposition: A | Discharge status: Home / Self Care | Payer: Medicare Other | Source: Ambulatory Visit | Attending: Interventional Radiology | Admitting: Interventional Radiology

## 2016-11-10 ENCOUNTER — Encounter (HOSPITAL_COMMUNITY): Payer: Self-pay

## 2016-11-10 ENCOUNTER — Other Ambulatory Visit (HOSPITAL_COMMUNITY): Payer: Self-pay | Admitting: Interventional Radiology

## 2016-11-10 DIAGNOSIS — K750 Abscess of liver: Secondary | ICD-10-CM | POA: Insufficient documentation

## 2016-11-10 DIAGNOSIS — K650 Generalized (acute) peritonitis: Secondary | ICD-10-CM

## 2016-11-10 DIAGNOSIS — G8929 Other chronic pain: Secondary | ICD-10-CM | POA: Diagnosis not present

## 2016-11-10 DIAGNOSIS — Z87891 Personal history of nicotine dependence: Secondary | ICD-10-CM | POA: Insufficient documentation

## 2016-11-10 DIAGNOSIS — Z4803 Encounter for change or removal of drains: Secondary | ICD-10-CM | POA: Insufficient documentation

## 2016-11-10 DIAGNOSIS — I700 Atherosclerosis of aorta: Secondary | ICD-10-CM | POA: Diagnosis not present

## 2016-11-10 DIAGNOSIS — I10 Essential (primary) hypertension: Secondary | ICD-10-CM | POA: Diagnosis not present

## 2016-11-10 DIAGNOSIS — Z9049 Acquired absence of other specified parts of digestive tract: Secondary | ICD-10-CM | POA: Insufficient documentation

## 2016-11-10 DIAGNOSIS — K449 Diaphragmatic hernia without obstruction or gangrene: Secondary | ICD-10-CM | POA: Diagnosis not present

## 2016-11-10 DIAGNOSIS — G250 Essential tremor: Secondary | ICD-10-CM | POA: Diagnosis not present

## 2016-11-10 DIAGNOSIS — K760 Fatty (change of) liver, not elsewhere classified: Secondary | ICD-10-CM | POA: Diagnosis not present

## 2016-11-10 DIAGNOSIS — E7800 Pure hypercholesterolemia, unspecified: Secondary | ICD-10-CM | POA: Diagnosis not present

## 2016-11-10 DIAGNOSIS — Z7982 Long term (current) use of aspirin: Secondary | ICD-10-CM | POA: Diagnosis not present

## 2016-11-10 DIAGNOSIS — Z8673 Personal history of transient ischemic attack (TIA), and cerebral infarction without residual deficits: Secondary | ICD-10-CM | POA: Diagnosis not present

## 2016-11-10 DIAGNOSIS — F0390 Unspecified dementia without behavioral disturbance: Secondary | ICD-10-CM | POA: Diagnosis not present

## 2016-11-10 LAB — PROTIME-INR
INR: 1.04
PROTHROMBIN TIME: 13.6 s (ref 11.4–15.2)

## 2016-11-10 LAB — CBC
HCT: 42.7 % (ref 39.0–52.0)
Hemoglobin: 13.2 g/dL (ref 13.0–17.0)
MCH: 26.5 pg (ref 26.0–34.0)
MCHC: 30.9 g/dL (ref 30.0–36.0)
MCV: 85.7 fL (ref 78.0–100.0)
PLATELETS: 325 10*3/uL (ref 150–400)
RBC: 4.98 MIL/uL (ref 4.22–5.81)
RDW: 17.8 % — ABNORMAL HIGH (ref 11.5–15.5)
WBC: 10.1 10*3/uL (ref 4.0–10.5)

## 2016-11-10 LAB — BASIC METABOLIC PANEL
Anion gap: 6 (ref 5–15)
BUN: 19 mg/dL (ref 6–20)
CALCIUM: 8.9 mg/dL (ref 8.9–10.3)
CO2: 28 mmol/L (ref 22–32)
CREATININE: 1.13 mg/dL (ref 0.61–1.24)
Chloride: 106 mmol/L (ref 101–111)
GFR calc Af Amer: >60 mL/min (ref >60)
GFR, EST NON AFRICAN AMERICAN: 60 mL/min — AB (ref >60)
GLUCOSE: 90 mg/dL (ref 65–99)
Potassium: 4.3 mmol/L (ref 3.5–5.1)
SODIUM: 140 mmol/L (ref 135–145)

## 2016-11-10 LAB — APTT: APTT: 33 s (ref 24–36)

## 2016-11-10 MED ORDER — MIDAZOLAM HCL 2 MG/2ML IJ SOLN
INTRAMUSCULAR | Status: AC
  Filled 2016-11-10: qty 2

## 2016-11-10 MED ORDER — FENTANYL CITRATE (PF) 100 MCG/2ML IJ SOLN
INTRAMUSCULAR | Status: AC
  Filled 2016-11-10: qty 2

## 2016-11-10 MED ORDER — LIDOCAINE HCL (PF) 1 % IJ SOLN
INTRAMUSCULAR | Status: AC
  Filled 2016-11-10: qty 30

## 2016-11-10 MED ORDER — SODIUM CHLORIDE 0.9 % IV SOLN
INTRAVENOUS | Status: DC
  Administered 2016-11-10: 09:00:00 via INTRAVENOUS

## 2016-11-10 NOTE — Progress Notes (Signed)
D/C home in wheelchair with wife. Pt taken back to nursing home by transport. Awake and alert. In no distress.

## 2016-11-10 NOTE — Sedation Documentation (Signed)
Precedure cancelled by Dr. Fredia SorrowYamagata. Dr. Fredia SorrowYamagata spoke with wife and pt. Pt awake and alert. In no distress. Transport called for pickup.
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
## 2016-11-10 NOTE — Progress Notes (Signed)
Patient ID: [name] HeinzJohn F [name], male   DOB: 08/08/1935, 81 y.o.   MRN: [id number]   Interventional Radiology:  CT performed prior to possible repeat perihepatic drain placement shows significant decrease in size of perihepatic collection.  Decision made not to proceed with drain placement today.  [name] MarbleGlenn T. [name] SorrowYamagata, M.D Pager:  [phone]

## 2016-12-03 ENCOUNTER — Telehealth: Payer: Self-pay | Admitting: *Deleted

## 2016-12-03 ENCOUNTER — Ambulatory Visit (INDEPENDENT_AMBULATORY_CARE_PROVIDER_SITE_OTHER): Payer: Medicare Other | Admitting: Internal Medicine

## 2016-12-03 VITALS — BP 119/76 | HR 55 | Temp 97.5°F

## 2016-12-03 DIAGNOSIS — K651 Peritoneal abscess: Secondary | ICD-10-CM | POA: Diagnosis not present

## 2016-12-03 MED ORDER — LEVOFLOXACIN 500 MG PO TABS
500.0000 mg | ORAL_TABLET | Freq: Every day | ORAL | Status: AC
Start: 2016-12-03 — End: ?

## 2016-12-03 NOTE — Telephone Encounter (Signed)
Antibiotic orders (stop doxycycline, start levofloxacin 500 mg by mouth once daily through follow up appointment), office visit notes, return appointment information (9/6 9:15am) [facility] [place]. Dr Orvan Falconerampbell is requesting that a family member attend the next appointment. [name] CossHowell, [name] Peschke M, RN
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
## 2016-12-03 NOTE — Addendum Note (Signed)
Addended by: Cliffton AstersAMPBELL, Trenell on: 12/03/2016 01:55 PM   Modules accepted: Orders

## 2017-01-14 ENCOUNTER — Ambulatory Visit (INDEPENDENT_AMBULATORY_CARE_PROVIDER_SITE_OTHER): Payer: Medicare Other | Admitting: Internal Medicine

## 2017-01-14 DIAGNOSIS — K651 Peritoneal abscess: Secondary | ICD-10-CM | POA: Diagnosis not present

## 2017-01-14 NOTE — Progress Notes (Signed)
[facility]  Patient Active Problem List   Diagnosis Date Noted  . Subhepatic abscess (HCC)     Priority: High  . Chronic back pain 02/14/2015  . Dysphagia, unspecified(787.20) 10/10/2013  . Chronic diastolic heart failure (HCC) 10/10/2013  . Slurred speech 10/09/2013  . Calculous cholecystitis 09/07/2013  . Ataxia 04/18/2013  . Dementia without behavioral disturbance 04/18/2013  . Hemiparesis due to old head trauma (HCC) 11/30/2012  . HYPERCHOLESTEROLEMIA, PURE 04/09/2008  . HYPERTENSION, BENIGN 04/09/2008    Patient's Medications  New Prescriptions   No medications on file  Previous Medications   ALBUTEROL (PROAIR HFA) 108 (90 BASE) MCG/ACT INHALER    Inhale 2 puffs into the lungs every 3 (three) hours as needed for wheezing or shortness of breath.   ASCORBIC ACID (VITAMIN C) 1000 MG TABLET    Take 1,000 mg by mouth daily.   ASPIRIN 325 MG TABLET    Take 325 mg by mouth daily.   BACLOFEN (LIORESAL) 20 MG TABLET    Take 20 mg by mouth every evening.   CHOLECALCIFEROL (VITAMIN D) 1000 UNITS TABLET    Take 1,000 Units by mouth daily.     CYANOCOBALAMIN 500 MCG TABLET    Take 500 mcg by mouth daily.   DEXTROMETHORPHAN-QUINIDINE (NUEDEXTA) 20-10 MG CAPS    Take 1 capsule by mouth daily.   DOCUSATE SODIUM (COLACE) 100 MG CAPSULE    Take 100 mg by mouth every evening.   FERROUS SULFATE 325 (65 FE) MG TABLET    Take 325 mg by mouth 2 (two) times daily.   FINASTERIDE (PROSCAR) 5 MG TABLET    Take 5 mg by mouth daily.   HYPROMELLOSE (GENTEAL) 0.3 % SOLN    Place 1 drop into both eyes 3 (three) times daily as needed (dry eyes).   LEVOFLOXACIN (LEVAQUIN) 500 MG TABLET    Take 1 tablet (500 mg total) by mouth daily.   LINIMENTS (SALONPAS) PADS    Apply 1 each topically daily as needed (pain). Apply to left knee   MAGIC MOUTHWASH SOLN    Take 5 mLs by mouth 2 (two) times daily.   METOPROLOL SUCCINATE (TOPROL-XL) 50 MG 24 HR TABLET    Take 50 mg by  mouth daily. Take with or immediately following a meal.   MIRTAZAPINE (REMERON SOL-TAB) 15 MG DISINTEGRATING TABLET    Take 15 mg by mouth at bedtime.    MULTIPLE VITAMINS-MINERALS (THERA-M) TABS    Take 1 tablet by mouth daily.   NUTRITIONAL SUPPLEMENTS (NUTRITIONAL SUPPLEMENT PO)    Take 120 mLs by mouth 2 (two) times daily. MEDPASS 2.0   POLYETHYLENE GLYCOL (MIRALAX / GLYCOLAX) PACKET    Take 17 g by mouth daily as needed.    PROPYLENE GLYCOL (SYSTANE BALANCE) 0.6 % SOLN    Place 1 drop into both eyes 2 (two) times daily.   SACCHAROMYCES BOULARDII (FLORASTOR) 250 MG CAPSULE    Take 250 mg by mouth 2 (two) times daily.  Modified Medications   No medications on file  Discontinued Medications   No medications on file    Subjective: [name] is in for his routine follow-up visit. He developed calculous cholecystitis in early 2015. Interventional radiology placed a percutaneous cholecystostomy tube and in one month later, in April 2015, he underwent laparoscopic cholecystectomy.   In October 2016 he developed a right chest wall abscess. A CT scan showed a right subhepatic abscess. He was  hospitalized. General surgery's notes indicate that they thought he had developed a fistula from the abscess to the skin surface along the previous cholecystostomy tube site. Apparently there were [name] gallstones coming out through the sinus tract. A percutaneous drain was placed and cultures grew MRSA. He received 6 days of IV vancomycin and then was discharged on oral doxycycline. He underwent serial CT scans. A follow-up scan in January 2017 said that his percutaneous drain had been inadvertently removed. That scan showed only residual soft tissue thickening in the region of the previous abscess. No drain was replaced.   I received a handwritten note from his skilled nursing facility. I cannot tell who wrote it. It is dated 08/10/2016. It states that he has a "chronic right chest abscess and that his wound is  still draining". The note indicated that they were changing his antibiotic to clindamycin. A call to his nursing home indicates that he has been on chronic doxycycline for several years.  A CT scan on 09/02/2016 showed a 1.7 x 4.4 cm abscess in the same location as previously seen. He had another percutaneous drain placed on 09/22/2016. Cultures grew group B streptococcus. Follow-up scan on 10/28/2016 showed a fluid collection now measured 2.9 x 1.9 x 11.1 cm and extended into the region of the posterior right thoracic costophrenic angle. A follow-up scan on 11/10/2016 showed that the abscess was significantly smaller and measured only 1.3 cm across. His drain had been removed sometime prior to that study.  I saw him on 12/03/2016 and elected to switch him to oral levofloxacin and treated at least 6 weeks. Records that came with him from [name]'s nursing home did not list levofloxacin on his medication list. However a called to the nursing home was made and we were told that he is still taking it.  He is unaccompanied today. 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 He is not able to contribute much history but can tell me that he is feeling better.   Review of Systems: Review of Systems  Unable to perform ROS: Mental acuity    Past Medical History:  Diagnosis Date  . Arthritis    "fingers" (09/07/2013)  . Cholecystitis   . Chronic pain    lower back "hasn't complained in months" (09/07/2013)  . Clonus    "RLE" "hadn't bothered him for awhile; at least 6 months" (09/07/2013)  . Dementia   . Dementia with behavioral disturbance 04/18/2013  . Familial tremor   . H/O hiatal hernia   . Hemiplegia (HCC) 09/07/1970   "horse fell on him"  . High cholesterol    "not on RX; taken off d/t vertigo fall 2014"  . Hypertension   . Stroke (HCC)   . Swelling of upper lip    tongue  . TBI (traumatic brain injury) (HCC) 09/07/1970   WITH LEFT HEMISPHERIC BLEED  . Vertigo     . Weakness generalized 12/22/2012   "not a problem lately" (09/07/2013)    Social History  Substance Use Topics  . Smoking status: Former Smoker    Years: 4.00    Types: Cigarettes  . Smokeless tobacco: Never Used     Comment: quit ismoking n 1965  . Alcohol use No    Family History  Problem Relation Age of Onset  . Tremor Maternal Grandfather     Allergies  Allergen Reactions  . Ace Inhibitors Swelling    On MAR  . Penicillins Swelling    Has patient  had a PCN reaction causing immediate rash, facial/tongue/throat swelling, SOB or lightheadedness with hypotension: yes Has patient had a PCN reaction causing severe rash involving mucus membranes or skin necrosis: unknown Has patient had a PCN reaction that required hospitalization no Has patient had a PCN reaction occurring within the last 10 years: no If all of the above answers are "NO", then may proceed with Cephalosporin use. On MAR     Objective: Vitals:   01/14/17 0948  BP: 109/72  Pulse: 60  Temp: 98 F (36.7 C)  TempSrc: Oral   There is no height or weight on file to calculate BMI.  Physical Exam  Constitutional:  He does look better today. He is more alert and interactive. He can answer some very simple questions with one-word answers.  Cardiovascular: Normal rate and regular rhythm.   No murmur heard. Pulmonary/Chest: Effort normal and breath sounds normal.  Abdominal: Soft. There is no tenderness.    Lab Results    Problem List Items Addressed This Visit      High   Subhepatic abscess [facility])    He has shown clinical and radiographic improvement on treatment for recurrent subhepatic abscess. This episode is due to group B strep, different organism than he was originally infected with 2 years ago. I'm hopeful that this can be completely cured with antibiotic therapy. I recommend 2 more weeks of therapy then stop. He will follow-up with me in 6 weeks.          Cliffton Asters, MD [facility]  for Infectious Disease [facility] [phone] pager   [phone] cell 01/14/2017, 12:07 PM

## 2017-01-14 NOTE — Assessment & Plan Note (Signed)
He has shown clinical and radiographic improvement on treatment for recurrent subhepatic abscess. This episode is due to group B strep, different organism than he was originally infected with 2 years ago. I'm hopeful that this can be completely cured with antibiotic therapy. I recommend 2 more weeks of therapy then stop. He will follow-up with me in 6 weeks.

## 2017-02-25 ENCOUNTER — Ambulatory Visit: Payer: Medicare Other | Admitting: Internal Medicine

## 2017-06-25 ENCOUNTER — Other Ambulatory Visit (HOSPITAL_COMMUNITY): Payer: Self-pay | Admitting: Internal Medicine

## 2017-06-25 DIAGNOSIS — R1310 Dysphagia, unspecified: Secondary | ICD-10-CM

## 2017-06-30 ENCOUNTER — Ambulatory Visit (HOSPITAL_COMMUNITY)
Admission: RE | Admit: 2017-06-30 | Discharge: 2017-06-30 | Disposition: A | Discharge status: Home / Self Care | Payer: Medicare Other | Source: Ambulatory Visit | Attending: Internal Medicine | Admitting: Internal Medicine

## 2017-06-30 DIAGNOSIS — R1312 Dysphagia, oropharyngeal phase: Secondary | ICD-10-CM | POA: Diagnosis not present

## 2017-06-30 DIAGNOSIS — R1310 Dysphagia, unspecified: Secondary | ICD-10-CM

## 2017-06-30 DIAGNOSIS — R9389 Abnormal findings on diagnostic imaging of other specified body structures: Secondary | ICD-10-CM | POA: Diagnosis not present

## 2017-06-30 NOTE — Progress Notes (Signed)
Modified Barium Swallow Progress Note  Patient Details  Name: [name] F Berber MRN: [id number] Date of Birth: 04/28/1936  Today's Date: 06/30/2017  Modified Barium Swallow completed.  Full report located under Chart Review in the Imaging Section.  Brief recommendations include the following:  Clinical Impression    Pt presents with mild to moderate oropharyngeal dysphagia characterized by poor oral bolus control with oral residue and premature spillage. He was not able to clear the pill from his oral cavity. Pt had penetration/aspiration from premature spillage of thin liquids and mixed consistencies. He was sensate to aspiration but his spontaneous cough cleared only his laryngeal vestibule and not what had fallen below the vocal cords. With use of chin tuck, pt only had minimal shallow penetration of thin liqud that cleared spontaneously with subsequent swallow. Pt hadmildcervical esophageal residue across consistencieswithout evidence of backflow. Pt benefited from Mod verbal and tactile cues (removing straw) to ensure aspiration precautions. Recommend continuing nectar-thick liquid and Mech soft solid diet with aspiration precuations to ensure Motyka/slow sips. Medications administered whole vs crushed in applesauce per pt tolerance. Pt has good potential to advance to thin liquids with consistent use of chin tuck strategy.   Swallow Evaluation Recommendations       SLP Diet Recommendations: Dysphagia 3 (Mech soft) solids;Nectar thick liquid   Liquid Administration via: Straw   Medication Administration: Whole meds with puree(as tolerated)   Supervision: Full assist for feeding;Full supervision/cueing for compensatory strategies   Compensations: Minimize environmental distractions;Slow rate;Swartout sips/bites;Chin tuck   Postural Changes: Seated upright at 90 degrees   Oral Care Recommendations: Oral care BID      [place] Atticus Wedin SLP Student Clinician   [place]  Caidence Kaseman 06/30/2017,2:46 PM
